# Patient Record
Sex: Female | Born: 1980 | Race: Black or African American | Hispanic: No | Marital: Married | Smoking: Former smoker
Health system: Southern US, Community
[De-identification: ages and names within clinical notes are randomized; demographics above are authoritative.]

## PROBLEM LIST (undated history)

## (undated) DIAGNOSIS — F1910 Other psychoactive substance abuse, uncomplicated: Secondary | ICD-10-CM

## (undated) DIAGNOSIS — D649 Anemia, unspecified: Secondary | ICD-10-CM

## (undated) DIAGNOSIS — F329 Major depressive disorder, single episode, unspecified: Secondary | ICD-10-CM

## (undated) DIAGNOSIS — R000 Tachycardia, unspecified: Secondary | ICD-10-CM

## (undated) DIAGNOSIS — F32A Depression, unspecified: Secondary | ICD-10-CM

## (undated) DIAGNOSIS — J45909 Unspecified asthma, uncomplicated: Secondary | ICD-10-CM

## (undated) DIAGNOSIS — I10 Essential (primary) hypertension: Secondary | ICD-10-CM

## (undated) DIAGNOSIS — J449 Chronic obstructive pulmonary disease, unspecified: Secondary | ICD-10-CM

## (undated) DIAGNOSIS — D72829 Elevated white blood cell count, unspecified: Secondary | ICD-10-CM

## (undated) HISTORY — DX: Depression, unspecified: F32.A

## (undated) HISTORY — DX: Tachycardia, unspecified: R00.0

## (undated) HISTORY — DX: Essential (primary) hypertension: I10

## (undated) HISTORY — DX: Unspecified asthma, uncomplicated: J45.909

## (undated) HISTORY — DX: Other disorders of phosphorus metabolism: E83.39

## (undated) HISTORY — DX: Major depressive disorder, single episode, unspecified: F32.9

## (undated) HISTORY — DX: Anemia, unspecified: D64.9

## (undated) HISTORY — DX: Elevated white blood cell count, unspecified: D72.829

## (undated) HISTORY — DX: Chronic obstructive pulmonary disease, unspecified: J44.9

## (undated) HISTORY — DX: Other psychoactive substance abuse, uncomplicated: F19.10

---

## 2006-04-24 ENCOUNTER — Emergency Department: Payer: Self-pay | Admitting: Emergency Medicine

## 2006-07-01 ENCOUNTER — Emergency Department: Payer: Self-pay | Admitting: Emergency Medicine

## 2010-03-26 ENCOUNTER — Emergency Department: Payer: Self-pay | Admitting: Emergency Medicine

## 2010-03-27 ENCOUNTER — Emergency Department: Payer: Self-pay | Admitting: Emergency Medicine

## 2010-03-30 ENCOUNTER — Emergency Department: Payer: Self-pay | Admitting: Emergency Medicine

## 2010-05-02 HISTORY — PX: DILATION AND CURETTAGE OF UTERUS: SHX78

## 2010-06-17 ENCOUNTER — Emergency Department: Payer: Self-pay | Admitting: Emergency Medicine

## 2010-06-19 ENCOUNTER — Emergency Department: Payer: Self-pay | Admitting: Emergency Medicine

## 2010-07-24 ENCOUNTER — Emergency Department: Payer: Self-pay | Admitting: Emergency Medicine

## 2010-10-01 HISTORY — PX: TRACHEOSTOMY: SUR1362

## 2010-10-18 ENCOUNTER — Inpatient Hospital Stay: Payer: Self-pay | Admitting: Internal Medicine

## 2010-10-26 DIAGNOSIS — I059 Rheumatic mitral valve disease, unspecified: Secondary | ICD-10-CM

## 2010-11-17 DIAGNOSIS — R000 Tachycardia, unspecified: Secondary | ICD-10-CM

## 2010-12-01 ENCOUNTER — Ambulatory Visit: Payer: Self-pay | Admitting: Cardiovascular Disease

## 2010-12-09 ENCOUNTER — Ambulatory Visit (INDEPENDENT_AMBULATORY_CARE_PROVIDER_SITE_OTHER): Payer: Self-pay | Admitting: Cardiovascular Disease

## 2010-12-09 ENCOUNTER — Encounter: Payer: Self-pay | Admitting: Cardiovascular Disease

## 2010-12-09 DIAGNOSIS — R0602 Shortness of breath: Secondary | ICD-10-CM

## 2010-12-09 DIAGNOSIS — R000 Tachycardia, unspecified: Secondary | ICD-10-CM

## 2010-12-09 MED ORDER — ALBUTEROL 90 MCG/ACT IN AERS: 2.0000 | INHALATION_SPRAY | Freq: Four times a day (QID) | RESPIRATORY_TRACT | Status: DC | PRN

## 2010-12-09 NOTE — Assessment & Plan Note (Signed)
Heart rate has improved since her discharge. She has not been taking beta blockers though she does report occasional tachycardia palpitations with exertion. Uncertain why she did not fill the prescription of metoprolol tartrate as it is very inexpensive. We have given her some samples of Bystolic to take as needed for tachycardia palpitations that do not resolve with rest. No further cardiac workup is needed at this time. She can followup with her primary care physician.

## 2010-12-09 NOTE — Patient Instructions (Signed)
Your heart rate is doing well. Please try Bystolic as needed for a fast heart rate that does not slow down. We have written a script for albuterol. Take as needed.  Please call us if you have new issues that need to be addressed before your next appt.  You will need follow up with a lung Doctor:   Dr. Meredeth Ide [phone]

## 2010-12-09 NOTE — Assessment & Plan Note (Signed)
Lungs are essentially clear on auscultation. My suspicion is she continues to have residual discomfort at the site of her tracheostomy. This is what is most bothersome to her. Have asked her to followup with ear nose throat in several weeks' time if she continues to have problems.

## 2010-12-09 NOTE — Progress Notes (Signed)
   Patient ID: Jacqueline Cooke, female    DOB: 1980/07/12, 30 y.o.   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 Cardiology was consult for her tachycardia which was worse with exertion. Her tachycardia was felt to be secondary to underlying lung disease. She was started on low-dose beta blocker. Echocardiogram was essentially normal.  She presents today and reports that she continues to have some shortness of breath and mild tachycardia. She has not been able to fill any of her medications as she could not afford them despite the metoprolol costing $4. She was unable to afford Lexapro which was over $100 and unable to afford albuterol nebulizer. She has been active, no is limited by her shortness of breath and a strange feeling in her throat where her tracheostomy is healing. She has had followup with ENT and nothing was done at that time. She did not have followup with pulmonary at the time of discharge. She is unable to lean back to sleep secondary to a uncomfortable feeling at the tracheostomy site.  EKG shows normal sinus rhythm with rate 81 beats per minute with nonspecific ST changes in leads V3 through V6, 2, 3, aVF  consistent with repolarization abnormality   Outpatient Encounter Prescriptions as of 12/09/2010  Medication Sig Dispense Refill  . No Medications        Review of Systems  Constitutional: Negative.   HENT: Positive for neck pain.   Eyes: Negative.   Respiratory: Positive for shortness of breath.   Cardiovascular: Negative.   Gastrointestinal: Negative.   Skin: Negative.   Neurological: Negative.   Hematological: Negative.   Psychiatric/Behavioral: Negative.   All other  systems reviewed and are negative.    BP 117/77  Pulse 85  Ht 5\' 8"  (1.727 m)  Wt 125 lb (56.7 kg)  BMI 19.01 kg/m2   Physical Exam  Nursing note and vitals reviewed. Constitutional: She is oriented to person, place, and time. She appears well-developed and well-nourished.  HENT:  Head: Normocephalic.  Nose: Nose normal.  Mouth/Throat: Oropharynx is clear and moist.  Eyes: Conjunctivae are normal. Pupils are equal, round, and reactive to light.  Neck: Normal range of motion. Neck supple. No JVD present.       Appears well-healed tracheostomy site  Cardiovascular: Normal rate, regular rhythm, S1 normal, S2 normal, normal heart sounds and intact distal pulses.  Exam reveals no gallop and no friction rub.   No murmur heard. Pulmonary/Chest: Effort normal and breath sounds normal. No respiratory distress. She has no wheezes. She has no rales. She exhibits no tenderness.  Abdominal: Soft. Bowel sounds are normal. She exhibits no distension. There is no tenderness.  Musculoskeletal: Normal range of motion. She exhibits no edema and no tenderness.  Lymphadenopathy:    She has no cervical adenopathy.  Neurological: She is alert and oriented to person, place, and time. Coordination normal.  Skin: Skin is warm and dry. No rash noted. No erythema.  Psychiatric: She has a normal mood and affect. Her behavior is normal. Judgment and thought content normal.         Assessment and Plan

## 2010-12-20 ENCOUNTER — Ambulatory Visit: Payer: Self-pay | Admitting: Otolaryngology

## 2011-01-24 ENCOUNTER — Emergency Department: Payer: Self-pay | Admitting: *Deleted

## 2011-01-28 ENCOUNTER — Inpatient Hospital Stay: Payer: Self-pay | Admitting: Internal Medicine

## 2011-01-29 DIAGNOSIS — R0602 Shortness of breath: Secondary | ICD-10-CM

## 2011-02-03 ENCOUNTER — Emergency Department: Payer: Self-pay | Admitting: *Deleted

## 2011-02-28 ENCOUNTER — Emergency Department: Payer: Self-pay | Admitting: Emergency Medicine

## 2011-03-18 ENCOUNTER — Ambulatory Visit: Payer: Self-pay | Admitting: Obstetrics and Gynecology

## 2011-06-04 ENCOUNTER — Emergency Department: Payer: Self-pay | Admitting: Emergency Medicine

## 2011-06-04 LAB — URINALYSIS, COMPLETE
Bacteria: NONE SEEN
Bilirubin,UR: NEGATIVE
Glucose,UR: NEGATIVE mg/dL (ref 0–75)
Leukocyte Esterase: NEGATIVE
Nitrite: NEGATIVE
Specific Gravity: 1.02 (ref 1.003–1.030)
Squamous Epithelial: 2
WBC UR: 4 /HPF (ref 0–5)

## 2011-06-04 LAB — CBC
HCT: 39 % (ref 35.0–47.0)
HGB: 13.1 g/dL (ref 12.0–16.0)
RBC: 4.36 10*6/uL (ref 3.80–5.20)
RDW: 15 % — ABNORMAL HIGH (ref 11.5–14.5)

## 2011-06-04 LAB — COMPREHENSIVE METABOLIC PANEL
Alkaline Phosphatase: 55 U/L (ref 50–136)
Anion Gap: 10 (ref 7–16)
Bilirubin,Total: 0.2 mg/dL (ref 0.2–1.0)
Calcium, Total: 9.8 mg/dL (ref 8.5–10.1)
Chloride: 103 mmol/L (ref 98–107)
Co2: 26 mmol/L (ref 21–32)
Creatinine: 0.63 mg/dL (ref 0.60–1.30)
EGFR (African American): >60
EGFR (Non-African Amer.): >60
SGPT (ALT): 15 U/L

## 2011-06-04 LAB — LIPASE, BLOOD: Lipase: 61 U/L — ABNORMAL LOW (ref 73–393)

## 2011-06-05 LAB — URINE CULTURE

## 2011-06-28 ENCOUNTER — Emergency Department: Payer: Self-pay | Admitting: Emergency Medicine

## 2011-06-28 LAB — URINALYSIS, COMPLETE
Bilirubin,UR: NEGATIVE
Blood: NEGATIVE
Leukocyte Esterase: NEGATIVE
Nitrite: NEGATIVE
Ph: 5 (ref 4.5–8.0)
Protein: NEGATIVE
Specific Gravity: 1.011 (ref 1.003–1.030)

## 2011-06-28 LAB — PREGNANCY, URINE: Pregnancy Test, Urine: POSITIVE m[IU]/mL

## 2011-09-01 ENCOUNTER — Emergency Department: Payer: Self-pay | Admitting: Emergency Medicine

## 2011-10-30 ENCOUNTER — Emergency Department: Payer: Self-pay | Admitting: Emergency Medicine

## 2011-10-30 LAB — URINALYSIS, COMPLETE
Bacteria: NONE SEEN
Bilirubin,UR: NEGATIVE
Glucose,UR: NEGATIVE mg/dL (ref 0–75)
Ketone: NEGATIVE
Nitrite: NEGATIVE
Protein: 30
RBC,UR: 1 /HPF (ref 0–5)
Specific Gravity: 1.027 (ref 1.003–1.030)
Squamous Epithelial: 6
WBC UR: 1 /HPF (ref 0–5)

## 2011-10-30 LAB — CBC
MCH: 31.4 pg (ref 26.0–34.0)
MCV: 93 fL (ref 80–100)
Platelet: 219 10*3/uL (ref 150–440)
WBC: 10.9 10*3/uL (ref 3.6–11.0)

## 2011-10-30 LAB — COMPREHENSIVE METABOLIC PANEL
Albumin: 3.8 g/dL (ref 3.4–5.0)
Alkaline Phosphatase: 65 U/L (ref 50–136)
BUN: 10 mg/dL (ref 7–18)
Bilirubin,Total: 0.3 mg/dL (ref 0.2–1.0)
EGFR (African American): >60
Osmolality: 278 (ref 275–301)
Potassium: 3.5 mmol/L (ref 3.5–5.1)
SGPT (ALT): 12 U/L

## 2011-10-30 LAB — DRUG SCREEN, URINE
Amphetamines, Ur Screen: NEGATIVE (ref ?–1000)
Benzodiazepine, Ur Scrn: NEGATIVE (ref ?–200)
Cocaine Metabolite,Ur ~~LOC~~: POSITIVE (ref ?–300)
MDMA (Ecstasy)Ur Screen: NEGATIVE (ref ?–500)
Methadone, Ur Screen: NEGATIVE (ref ?–300)
Opiate, Ur Screen: NEGATIVE (ref ?–300)
Phencyclidine (PCP) Ur S: NEGATIVE (ref ?–25)
Tricyclic, Ur Screen: NEGATIVE (ref ?–1000)

## 2011-11-14 ENCOUNTER — Encounter: Payer: Self-pay | Admitting: Maternal and Fetal Medicine

## 2011-11-26 ENCOUNTER — Emergency Department: Payer: Self-pay | Admitting: Emergency Medicine

## 2011-11-26 LAB — COMPREHENSIVE METABOLIC PANEL
Anion Gap: 7 (ref 7–16)
Chloride: 107 mmol/L (ref 98–107)
Co2: 25 mmol/L (ref 21–32)
Creatinine: 0.56 mg/dL — ABNORMAL LOW (ref 0.60–1.30)
EGFR (African American): >60
Osmolality: 276 (ref 275–301)
Potassium: 4 mmol/L (ref 3.5–5.1)
Sodium: 139 mmol/L (ref 136–145)

## 2011-11-26 LAB — CBC
HGB: 13.3 g/dL (ref 12.0–16.0)
RBC: 4.02 10*6/uL (ref 3.80–5.20)
WBC: 10.4 10*3/uL (ref 3.6–11.0)

## 2011-11-26 LAB — URINALYSIS, COMPLETE
Bacteria: NONE SEEN
Bilirubin,UR: NEGATIVE
Glucose,UR: NEGATIVE mg/dL (ref 0–75)
Ketone: NEGATIVE
Ph: 7 (ref 4.5–8.0)

## 2011-11-26 LAB — HCG, QUANTITATIVE, PREGNANCY: Beta Hcg, Quant.: 54328 m[IU]/mL — ABNORMAL HIGH

## 2011-11-29 ENCOUNTER — Emergency Department: Payer: Self-pay | Admitting: Emergency Medicine

## 2011-11-29 LAB — URINALYSIS, COMPLETE
Bacteria: NONE SEEN
Bilirubin,UR: NEGATIVE
Glucose,UR: NEGATIVE mg/dL (ref 0–75)
Leukocyte Esterase: NEGATIVE
Nitrite: NEGATIVE
Ph: 6 (ref 4.5–8.0)
RBC,UR: 1777 /HPF (ref 0–5)
Squamous Epithelial: NONE SEEN

## 2011-11-29 LAB — CBC
MCH: 33.1 pg (ref 26.0–34.0)
MCHC: 35.3 g/dL (ref 32.0–36.0)
MCV: 94 fL (ref 80–100)
Platelet: 228 10*3/uL (ref 150–440)
RBC: 4.18 10*6/uL (ref 3.80–5.20)
RDW: 14.9 % — ABNORMAL HIGH (ref 11.5–14.5)
WBC: 10.3 10*3/uL (ref 3.6–11.0)

## 2011-11-29 LAB — WET PREP, GENITAL

## 2012-01-03 ENCOUNTER — Ambulatory Visit: Payer: Self-pay | Admitting: Obstetrics and Gynecology

## 2012-01-03 LAB — CBC
HGB: 13.2 g/dL (ref 12.0–16.0)
MCH: 32.4 pg (ref 26.0–34.0)
MCHC: 34.1 g/dL (ref 32.0–36.0)
MCV: 95 fL (ref 80–100)
RBC: 4.08 10*6/uL (ref 3.80–5.20)
RDW: 13.9 % (ref 11.5–14.5)
WBC: 11.8 10*3/uL — ABNORMAL HIGH (ref 3.6–11.0)

## 2012-01-03 LAB — RAPID URINE DRUG SCREEN, HOSP PERFORMED
Amphetamines, Ur Screen: NEGATIVE (ref ?–1000)
Barbiturates, Ur Screen: NEGATIVE (ref ?–200)
Benzodiazepine, Ur Scrn: NEGATIVE (ref ?–200)
Cocaine Metabolite,Ur ~~LOC~~: POSITIVE (ref ?–300)

## 2012-01-05 ENCOUNTER — Ambulatory Visit: Payer: Self-pay | Admitting: Obstetrics and Gynecology

## 2012-01-05 LAB — RAPID URINE DRUG SCREEN, HOSP PERFORMED
Amphetamines, Ur Screen: NEGATIVE (ref ?–1000)
Opiate, Ur Screen: NEGATIVE (ref ?–300)

## 2012-01-06 ENCOUNTER — Inpatient Hospital Stay: Payer: Self-pay | Admitting: Psychiatry

## 2012-01-06 LAB — CBC WITH DIFFERENTIAL/PLATELET
Basophil %: 0.2 %
Eosinophil %: 1.3 %
HGB: 12 g/dL (ref 12.0–16.0)
Lymphocyte #: 1.6 10*3/uL (ref 1.0–3.6)
Lymphocyte %: 16.6 %
MCH: 33.7 pg (ref 26.0–34.0)
MCV: 97 fL (ref 80–100)
Monocyte #: 0.6 x10 3/mm (ref 0.2–0.9)
Monocyte %: 6.2 %
Neutrophil %: 75.7 %
Platelet: 220 10*3/uL (ref 150–440)
RBC: 3.55 10*6/uL — ABNORMAL LOW (ref 3.80–5.20)
RDW: 14.1 % (ref 11.5–14.5)
WBC: 9.4 10*3/uL (ref 3.6–11.0)

## 2012-01-06 LAB — BASIC METABOLIC PANEL
Anion Gap: 5 — ABNORMAL LOW (ref 7–16)
BUN: 4 mg/dL — ABNORMAL LOW (ref 7–18)
Calcium, Total: 8.8 mg/dL (ref 8.5–10.1)
Chloride: 107 mmol/L (ref 98–107)
Co2: 26 mmol/L (ref 21–32)
Creatinine: 0.5 mg/dL — ABNORMAL LOW (ref 0.60–1.30)
EGFR (Non-African Amer.): >60
Glucose: 80 mg/dL (ref 65–99)
Osmolality: 272 (ref 275–301)

## 2012-01-06 LAB — DRUG SCREEN, URINE
Barbiturates, Ur Screen: NEGATIVE (ref ?–200)
Cannabinoid 50 Ng, Ur ~~LOC~~: NEGATIVE (ref ?–50)
Cocaine Metabolite,Ur ~~LOC~~: POSITIVE (ref ?–300)
Methadone, Ur Screen: NEGATIVE (ref ?–300)
Opiate, Ur Screen: NEGATIVE (ref ?–300)
Tricyclic, Ur Screen: NEGATIVE (ref ?–1000)

## 2012-01-06 LAB — TSH: Thyroid Stimulating Horm: 0.651 u[IU]/mL

## 2012-01-06 LAB — FOLATE: Folic Acid: 17.3 ng/mL (ref 3.1–100.0)

## 2012-01-07 LAB — DRUG SCREEN, URINE
Barbiturates, Ur Screen: NEGATIVE (ref ?–200)
Benzodiazepine, Ur Scrn: NEGATIVE (ref ?–200)
Cannabinoid 50 Ng, Ur ~~LOC~~: NEGATIVE (ref ?–50)
Cocaine Metabolite,Ur ~~LOC~~: POSITIVE (ref ?–300)
MDMA (Ecstasy)Ur Screen: NEGATIVE (ref ?–500)
Methadone, Ur Screen: NEGATIVE (ref ?–300)
Phencyclidine (PCP) Ur S: NEGATIVE (ref ?–25)

## 2012-01-08 LAB — CBC WITH DIFFERENTIAL/PLATELET
Basophil #: 0 10*3/uL (ref 0.0–0.1)
Basophil %: 0.4 %
Eosinophil %: 2.8 %
HGB: 11.2 g/dL — ABNORMAL LOW (ref 12.0–16.0)
Lymphocyte #: 2 10*3/uL (ref 1.0–3.6)
MCH: 33 pg (ref 26.0–34.0)
MCV: 97 fL (ref 80–100)
Monocyte #: 0.7 x10 3/mm (ref 0.2–0.9)
Monocyte %: 9.1 %
Platelet: 195 10*3/uL (ref 150–440)
RBC: 3.39 10*6/uL — ABNORMAL LOW (ref 3.80–5.20)
RDW: 14.1 % (ref 11.5–14.5)
WBC: 7.3 10*3/uL (ref 3.6–11.0)

## 2012-01-08 LAB — DRUG SCREEN, URINE
Benzodiazepine, Ur Scrn: NEGATIVE (ref ?–200)
Cannabinoid 50 Ng, Ur ~~LOC~~: NEGATIVE (ref ?–50)
Methadone, Ur Screen: NEGATIVE (ref ?–300)
Phencyclidine (PCP) Ur S: NEGATIVE (ref ?–25)
Tricyclic, Ur Screen: NEGATIVE (ref ?–1000)

## 2012-01-09 LAB — CBC WITH DIFFERENTIAL/PLATELET
Basophil #: 0 10*3/uL (ref 0.0–0.1)
Basophil %: 0.3 %
Comment - H1-Com1: NORMAL
Comment - H1-Com2: NORMAL
Eosinophil %: 2 %
HCT: 35.2 % (ref 35.0–47.0)
HGB: 11.9 g/dL — ABNORMAL LOW (ref 12.0–16.0)
Lymphocyte #: 1.7 10*3/uL (ref 1.0–3.6)
Lymphocyte %: 25.7 %
Lymphocytes: 33 %
Monocyte #: 0.6 x10 3/mm (ref 0.2–0.9)
Monocyte %: 8.2 %
Monocytes: 5 %
Neutrophil #: 4.3 10*3/uL (ref 1.4–6.5)
Neutrophil %: 63.8 %
Segmented Neutrophils: 61 %
WBC: 6.8 10*3/uL (ref 3.6–11.0)

## 2012-01-10 ENCOUNTER — Ambulatory Visit: Payer: Self-pay | Admitting: Obstetrics and Gynecology

## 2012-01-10 LAB — CBC WITH DIFFERENTIAL/PLATELET
Basophil %: 0.8 %
Eosinophil #: 0.2 10*3/uL (ref 0.0–0.7)
Eosinophil %: 3.1 %
HGB: 11.5 g/dL — ABNORMAL LOW (ref 12.0–16.0)
Lymphocyte #: 1.7 10*3/uL (ref 1.0–3.6)
Lymphocyte %: 26.1 %
MCH: 33.3 pg (ref 26.0–34.0)
MCHC: 34.2 g/dL (ref 32.0–36.0)
MCV: 97 fL (ref 80–100)
Monocyte #: 0.5 x10 3/mm (ref 0.2–0.9)
Neutrophil %: 62.2 %
Platelet: 188 10*3/uL (ref 150–440)
RBC: 3.46 10*6/uL — ABNORMAL LOW (ref 3.80–5.20)
RDW: 14.3 % (ref 11.5–14.5)
WBC: 6.6 10*3/uL (ref 3.6–11.0)

## 2012-01-10 LAB — COMPREHENSIVE METABOLIC PANEL
Albumin: 3.2 g/dL — ABNORMAL LOW (ref 3.4–5.0)
Alkaline Phosphatase: 53 U/L (ref 50–136)
BUN: 4 mg/dL — ABNORMAL LOW (ref 7–18)
Bilirubin,Total: 0.2 mg/dL (ref 0.2–1.0)
Chloride: 105 mmol/L (ref 98–107)
Co2: 23 mmol/L (ref 21–32)
EGFR (Non-African Amer.): >60
Glucose: 81 mg/dL (ref 65–99)
Osmolality: 268 (ref 275–301)
SGOT(AST): 6 U/L — ABNORMAL LOW (ref 15–37)
SGPT (ALT): 11 U/L — ABNORMAL LOW (ref 12–78)

## 2012-01-22 ENCOUNTER — Emergency Department: Payer: Self-pay | Admitting: Emergency Medicine

## 2012-01-22 LAB — COMPREHENSIVE METABOLIC PANEL
Alkaline Phosphatase: 80 U/L (ref 50–136)
Anion Gap: 9 (ref 7–16)
BUN: 4 mg/dL — ABNORMAL LOW (ref 7–18)
Bilirubin,Total: 0.3 mg/dL (ref 0.2–1.0)
Calcium, Total: 8.8 mg/dL (ref 8.5–10.1)
Chloride: 107 mmol/L (ref 98–107)
Co2: 25 mmol/L (ref 21–32)
Creatinine: 0.8 mg/dL (ref 0.60–1.30)
EGFR (African American): >60
EGFR (Non-African Amer.): >60
Glucose: 101 mg/dL — ABNORMAL HIGH (ref 65–99)
Osmolality: 278 (ref 275–301)
Potassium: 3.6 mmol/L (ref 3.5–5.1)

## 2012-01-22 LAB — URINALYSIS, COMPLETE
Bacteria: NONE SEEN
Bilirubin,UR: NEGATIVE
Glucose,UR: NEGATIVE mg/dL (ref 0–75)
Ketone: NEGATIVE
RBC,UR: 152 /HPF (ref 0–5)
Specific Gravity: 1.02 (ref 1.003–1.030)
Squamous Epithelial: NONE SEEN
WBC UR: 2317 /HPF (ref 0–5)

## 2012-01-22 LAB — LIPASE, BLOOD: Lipase: 41 U/L — ABNORMAL LOW (ref 73–393)

## 2012-01-22 LAB — CBC
HCT: 38.1 % (ref 35.0–47.0)
HGB: 12.9 g/dL (ref 12.0–16.0)
MCHC: 34 g/dL (ref 32.0–36.0)
Platelet: 253 10*3/uL (ref 150–440)
RBC: 3.97 10*6/uL (ref 3.80–5.20)

## 2012-01-22 LAB — MAGNESIUM: Magnesium: 2.1 mg/dL

## 2012-01-28 LAB — CULTURE, BLOOD (SINGLE)

## 2012-04-17 ENCOUNTER — Emergency Department: Payer: Self-pay | Admitting: Emergency Medicine

## 2012-04-17 LAB — COMPREHENSIVE METABOLIC PANEL
Alkaline Phosphatase: 82 U/L (ref 50–136)
Bilirubin,Total: 0.3 mg/dL (ref 0.2–1.0)
Calcium, Total: 8.7 mg/dL (ref 8.5–10.1)
Co2: 24 mmol/L (ref 21–32)
EGFR (African American): >60
EGFR (Non-African Amer.): >60
Glucose: 71 mg/dL (ref 65–99)
Potassium: 3.5 mmol/L (ref 3.5–5.1)
SGPT (ALT): 23 U/L (ref 12–78)
Sodium: 137 mmol/L (ref 136–145)
Total Protein: 7.3 g/dL (ref 6.4–8.2)

## 2012-04-17 LAB — SALICYLATE LEVEL: Salicylates, Serum: 2.6 mg/dL

## 2012-04-17 LAB — PREGNANCY, URINE: Pregnancy Test, Urine: NEGATIVE m[IU]/mL

## 2012-04-17 LAB — DRUG SCREEN, URINE
Benzodiazepine, Ur Scrn: NEGATIVE (ref ?–200)
Cannabinoid 50 Ng, Ur ~~LOC~~: NEGATIVE (ref ?–50)
Cocaine Metabolite,Ur ~~LOC~~: POSITIVE (ref ?–300)
MDMA (Ecstasy)Ur Screen: NEGATIVE (ref ?–500)
Methadone, Ur Screen: NEGATIVE (ref ?–300)
Phencyclidine (PCP) Ur S: NEGATIVE (ref ?–25)
Tricyclic, Ur Screen: NEGATIVE (ref ?–1000)

## 2012-04-17 LAB — ACETAMINOPHEN LEVEL: Acetaminophen: <2 ug/mL

## 2012-04-17 LAB — CBC
HCT: 38.7 % (ref 35.0–47.0)
HGB: 12.9 g/dL (ref 12.0–16.0)
MCH: 30.1 pg (ref 26.0–34.0)
MCV: 90 fL (ref 80–100)
RBC: 4.29 10*6/uL (ref 3.80–5.20)
RDW: 16 % — ABNORMAL HIGH (ref 11.5–14.5)
WBC: 7.7 10*3/uL (ref 3.6–11.0)

## 2012-04-17 LAB — URINALYSIS, COMPLETE
Blood: NEGATIVE
Ketone: NEGATIVE
Leukocyte Esterase: NEGATIVE
Nitrite: NEGATIVE
Ph: 6 (ref 4.5–8.0)
Protein: NEGATIVE
Specific Gravity: 1.013 (ref 1.003–1.030)
Squamous Epithelial: 8

## 2012-04-17 LAB — RAPID INFLUENZA A&B ANTIGENS

## 2012-05-03 LAB — DRUG SCREEN, URINE
Barbiturates, Ur Screen: NEGATIVE (ref ?–200)
Benzodiazepine, Ur Scrn: NEGATIVE (ref ?–200)
Cannabinoid 50 Ng, Ur ~~LOC~~: NEGATIVE (ref ?–50)
Methadone, Ur Screen: NEGATIVE (ref ?–300)
Tricyclic, Ur Screen: NEGATIVE (ref ?–1000)

## 2012-05-03 LAB — COMPREHENSIVE METABOLIC PANEL
Albumin: 4.1 g/dL (ref 3.4–5.0)
Alkaline Phosphatase: 89 U/L (ref 50–136)
Anion Gap: 6 — ABNORMAL LOW (ref 7–16)
BUN: 13 mg/dL (ref 7–18)
Bilirubin,Total: 0.3 mg/dL (ref 0.2–1.0)
Chloride: 106 mmol/L (ref 98–107)
Co2: 24 mmol/L (ref 21–32)
Potassium: 4 mmol/L (ref 3.5–5.1)
SGOT(AST): 16 U/L (ref 15–37)
SGPT (ALT): 13 U/L (ref 12–78)
Sodium: 136 mmol/L (ref 136–145)
Total Protein: 8.2 g/dL (ref 6.4–8.2)

## 2012-05-03 LAB — ETHANOL
Ethanol %: <0.003 % (ref 0.000–0.080)
Ethanol: <3 mg/dL

## 2012-05-03 LAB — URINALYSIS, COMPLETE
Bilirubin,UR: NEGATIVE
Glucose,UR: NEGATIVE mg/dL (ref 0–75)
Ph: 6 (ref 4.5–8.0)
RBC,UR: 2 /HPF (ref 0–5)
Specific Gravity: 1.025 (ref 1.003–1.030)

## 2012-05-03 LAB — CBC
HCT: 39.6 % (ref 35.0–47.0)
HGB: 12.7 g/dL (ref 12.0–16.0)
MCHC: 32 g/dL (ref 32.0–36.0)
MCV: 90 fL (ref 80–100)
Platelet: 259 10*3/uL (ref 150–440)

## 2012-05-03 LAB — HCG, QUANTITATIVE, PREGNANCY: Beta Hcg, Quant.: 1333 m[IU]/mL — ABNORMAL HIGH

## 2012-05-04 ENCOUNTER — Inpatient Hospital Stay: Payer: Self-pay | Admitting: Psychiatry

## 2012-05-07 LAB — URINALYSIS, COMPLETE
Bacteria: NONE SEEN
Blood: NEGATIVE
Leukocyte Esterase: NEGATIVE
Protein: NEGATIVE
RBC,UR: 1 /HPF (ref 0–5)
Specific Gravity: 1.031 (ref 1.003–1.030)
WBC UR: 1 /HPF (ref 0–5)

## 2012-06-01 IMAGING — CR DG CHEST 1V PORT
1 series · 1 of 1 positions shown · non-contrast
Comparison: none

REASON FOR EXAM: pt on vent
COMMENTS:

[view not recorded]
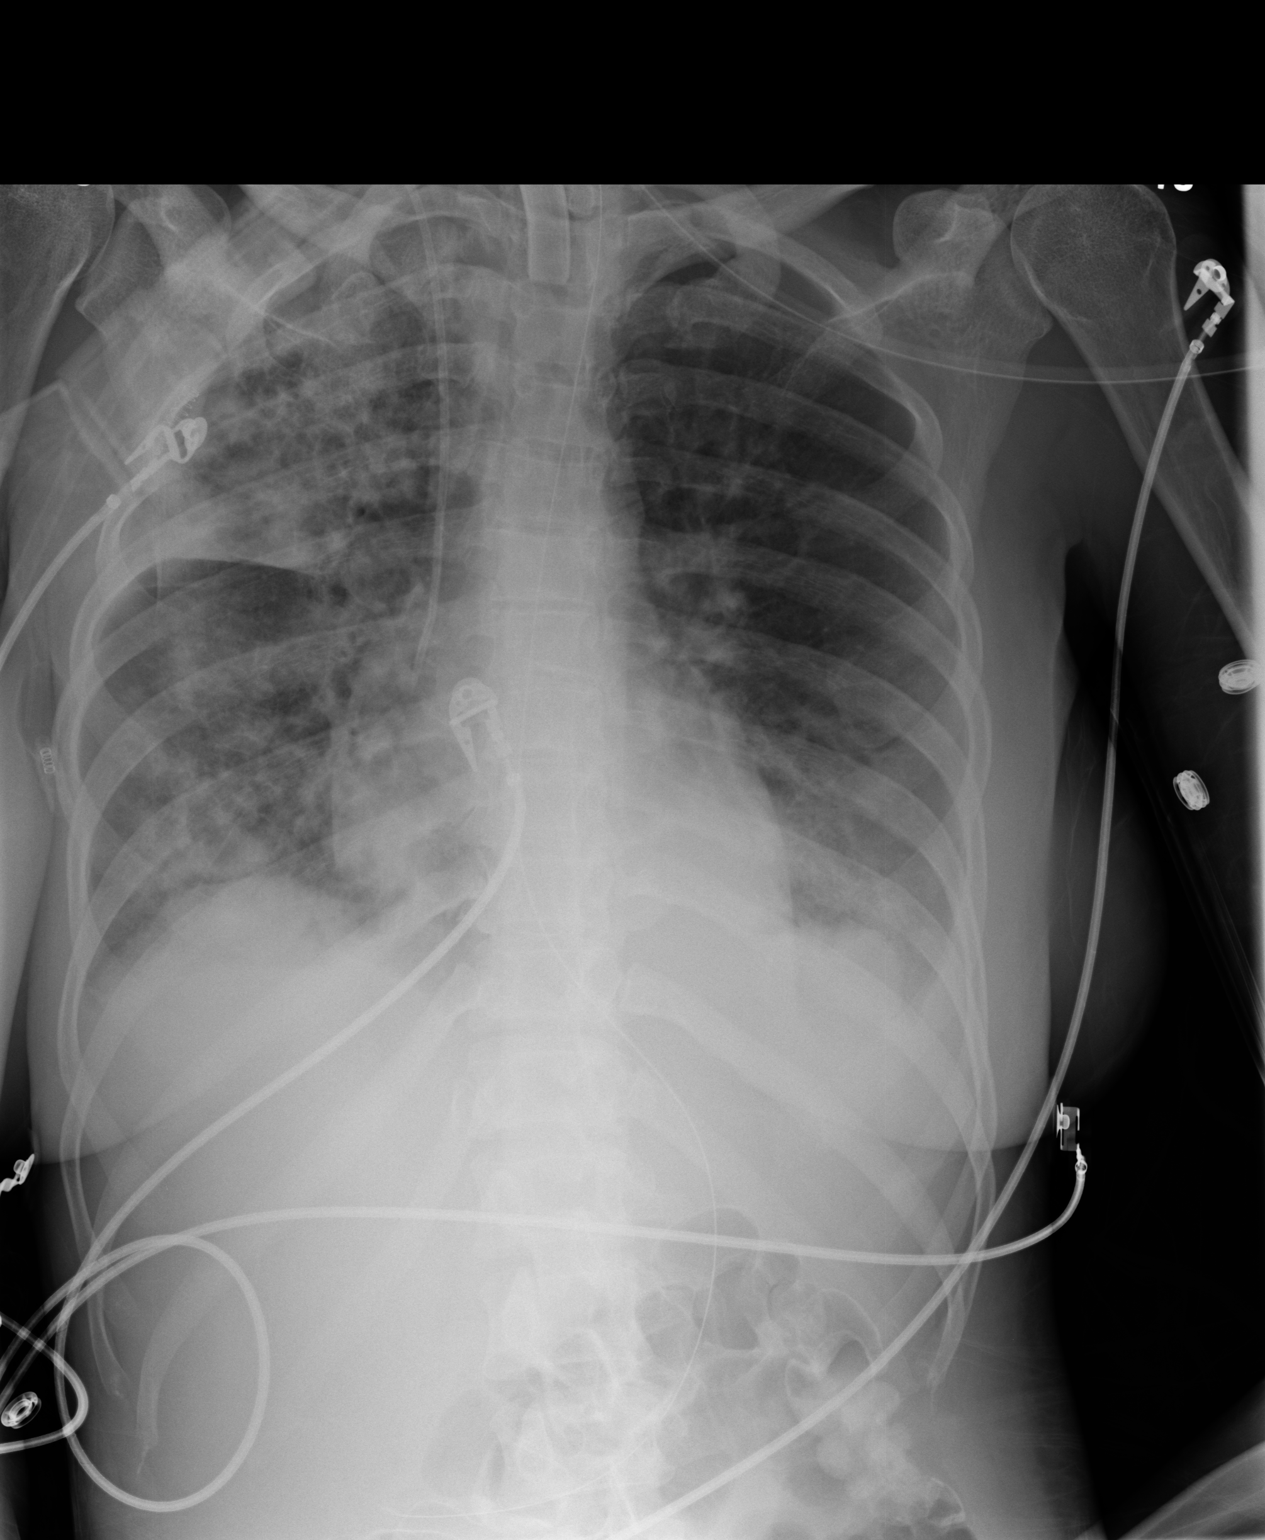

[1 of 1 positions shown; findings below may reference images not displayed]

PROCEDURE:     DXR - DXR PORTABLE CHEST SINGLE VIEW  - November 02, 2010  [DATE]

RESULT:     Comparison is made to a prior study dated 10/30/2010.

An endotracheal tube is appreciated with the tip at the level of the
clavicles. An NG tube is seen with tip not on the view of this study.

The diffuse pulmonary opacities within the right hemithorax have decreased
in conspicuity when compared to previous study. There is residual density
throughout right hemithorax with greatest confluence in the right upper
lobe. There has been a decrease in left lower lobe density. Prominence of
the interstitial markings is once again appreciated. The cardiac silhouette
is within normal limits. The visualized bony skeleton is unremarkable.
IMPRESSION: Decreased density of the bilateral infiltrates.  Continued surveillance
evaluation recommended.

## 2012-06-03 IMAGING — NM NUCLEAR MEDICINE HEPATOHBILIARY INCLUDE GB
1 series · 7 of 7 positions shown · non-contrast
Comparison: none

REASON FOR EXAM: elevated WBC on vent sludge on US
COMMENTS:

[Series 1000: gallbladder statics · 4.80mm/px · 7 of 7 slices shown]
[im 1/7]
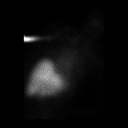
[im 2/7]
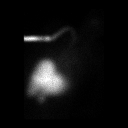
[im 3/7]
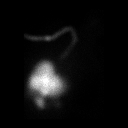
[im 4/7]
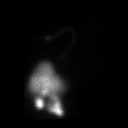
[im 5/7]
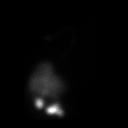
[im 6/7]
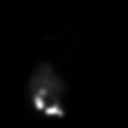
[im 7/7]
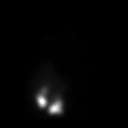

[7 of 7 positions shown; findings below may reference images not displayed]

PROCEDURE:     NM  - NM HEPATOBILIARY IMAGE  - November 04, 2010 [DATE]

RESULT:     The patient received a dose of 7.56 mCi of technetium 99m
Choletec through the central line. The patient is slightly rotated. There is
prompt extraction of tracer from the blood pool by the liver. Gallbladder
activity is first seen at 5 to 10 minutes with small bowel activity present
at 15 minutes. There is progressive clearing of activity from the liver.
IMPRESSION: Findings suggestive of normal Hepatobiliary Scan.

## 2012-06-06 ENCOUNTER — Emergency Department: Payer: Self-pay

## 2012-06-06 LAB — CBC
HCT: 41.2 % (ref 35.0–47.0)
HGB: 13.9 g/dL (ref 12.0–16.0)
MCH: 31.1 pg (ref 26.0–34.0)
MCHC: 33.7 g/dL (ref 32.0–36.0)
MCV: 92 fL (ref 80–100)
Platelet: 214 10*3/uL (ref 150–440)
RDW: 17.2 % — ABNORMAL HIGH (ref 11.5–14.5)
WBC: 14.6 10*3/uL — ABNORMAL HIGH (ref 3.6–11.0)

## 2012-06-06 LAB — COMPREHENSIVE METABOLIC PANEL
Albumin: 4.1 g/dL (ref 3.4–5.0)
Alkaline Phosphatase: 72 U/L (ref 50–136)
BUN: 8 mg/dL (ref 7–18)
Co2: 24 mmol/L (ref 21–32)
Creatinine: 0.55 mg/dL — ABNORMAL LOW (ref 0.60–1.30)
SGOT(AST): 17 U/L (ref 15–37)
SGPT (ALT): 19 U/L (ref 12–78)
Sodium: 134 mmol/L — ABNORMAL LOW (ref 136–145)

## 2012-06-06 LAB — URINALYSIS, COMPLETE
Bilirubin,UR: NEGATIVE
Blood: NEGATIVE
Glucose,UR: NEGATIVE mg/dL (ref 0–75)
Nitrite: NEGATIVE
Ph: 7 (ref 4.5–8.0)
Specific Gravity: 1.02 (ref 1.003–1.030)
WBC UR: 1 /HPF (ref 0–5)

## 2012-06-06 LAB — HCG, QUANTITATIVE, PREGNANCY: Beta Hcg, Quant.: 86210 m[IU]/mL — ABNORMAL HIGH

## 2012-06-17 ENCOUNTER — Emergency Department: Payer: Self-pay | Admitting: Emergency Medicine

## 2012-06-17 LAB — COMPREHENSIVE METABOLIC PANEL
Anion Gap: 8 (ref 7–16)
Calcium, Total: 9.1 mg/dL (ref 8.5–10.1)
Chloride: 104 mmol/L (ref 98–107)
Creatinine: 0.56 mg/dL — ABNORMAL LOW (ref 0.60–1.30)
EGFR (African American): >60
EGFR (Non-African Amer.): >60
Glucose: 99 mg/dL (ref 65–99)
Osmolality: 276 (ref 275–301)
Potassium: 3 mmol/L — ABNORMAL LOW (ref 3.5–5.1)
SGOT(AST): 19 U/L (ref 15–37)
SGPT (ALT): 18 U/L (ref 12–78)
Sodium: 139 mmol/L (ref 136–145)
Total Protein: 7.6 g/dL (ref 6.4–8.2)

## 2012-06-17 LAB — URINALYSIS, COMPLETE
Bilirubin,UR: NEGATIVE
Glucose,UR: NEGATIVE mg/dL (ref 0–75)
Protein: 30
RBC,UR: 13 /HPF (ref 0–5)
Specific Gravity: 1.026 (ref 1.003–1.030)
Squamous Epithelial: 17
WBC UR: 48 /HPF (ref 0–5)

## 2012-06-17 LAB — CBC
HGB: 13.1 g/dL (ref 12.0–16.0)
MCH: 32.1 pg (ref 26.0–34.0)
MCHC: 34.7 g/dL (ref 32.0–36.0)
MCV: 93 fL (ref 80–100)
RDW: 16.2 % — ABNORMAL HIGH (ref 11.5–14.5)
WBC: 9.6 10*3/uL (ref 3.6–11.0)

## 2012-06-17 LAB — RAPID INFLUENZA A&B ANTIGENS

## 2012-06-17 LAB — WET PREP, GENITAL

## 2012-08-24 DIAGNOSIS — F1911 Other psychoactive substance abuse, in remission: Secondary | ICD-10-CM | POA: Insufficient documentation

## 2012-09-27 IMAGING — US US OB < 14 WEEKS - US OB TV
1 series · 17 of 28 positions shown · non-contrast
Comparison: None

REASON FOR EXAM: vaginal bleeding, passing clots, pelvic pain
COMMENTS:   LMP: [DATE]

PROCEDURE:     US  - US OB LESS THAN 14 WEEKS/W TRANS  - February 28, 2011  [DATE]
RESULT:     Indication: Vaginal bleeding. Pelvic pain.
TECHNIQUE: Multiple transabdominal gray-scale images and endovaginal
gray-scale images of the pelvis performed.

[Series 1: us ob < 14 weeks - us ob tv · 17 of 89 slices shown]
[im 1/89]
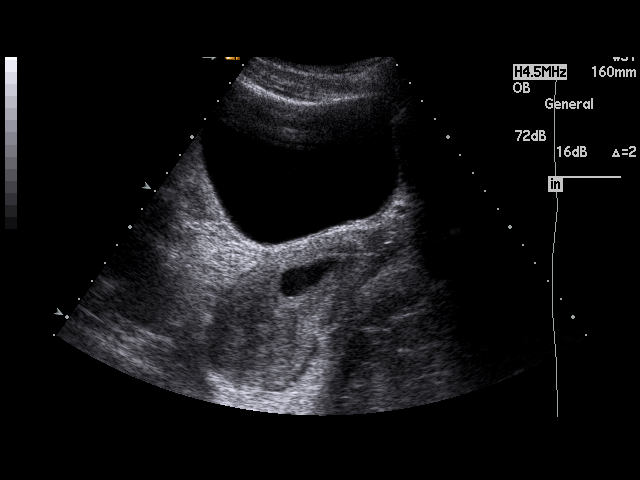
[im 7/89]
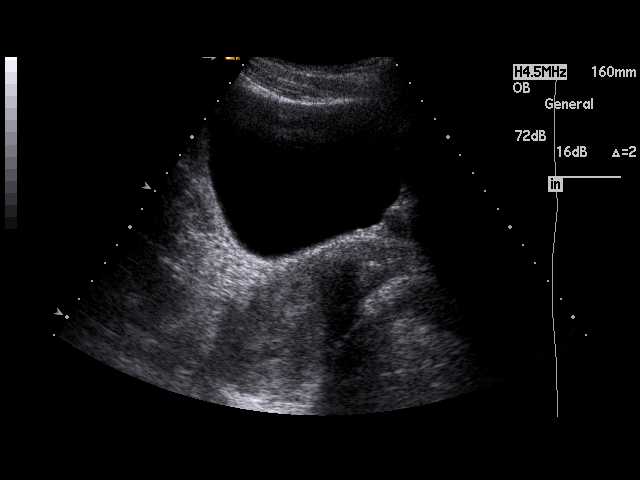
[im 14/89]
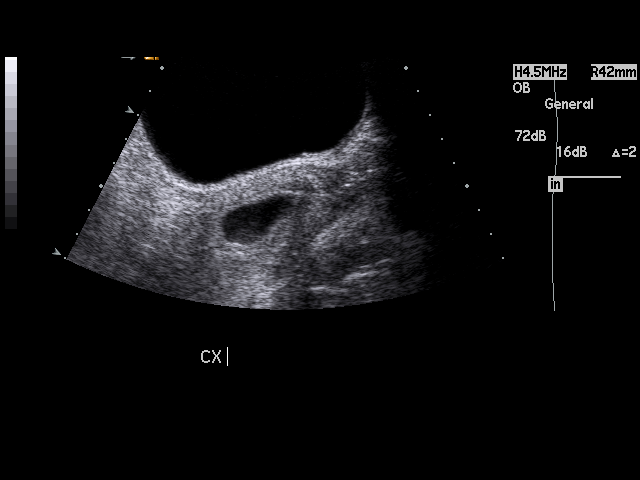
[im 17/89]
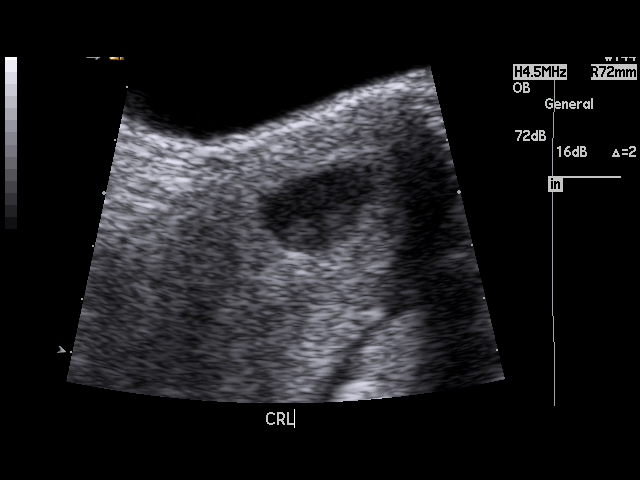
[im 23/89]
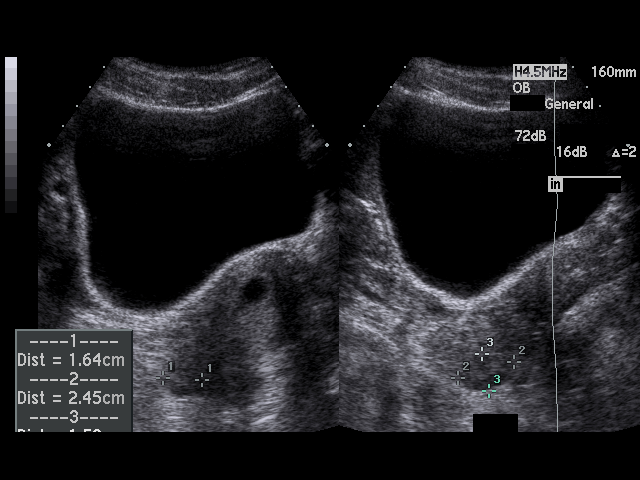
[im 30/89]
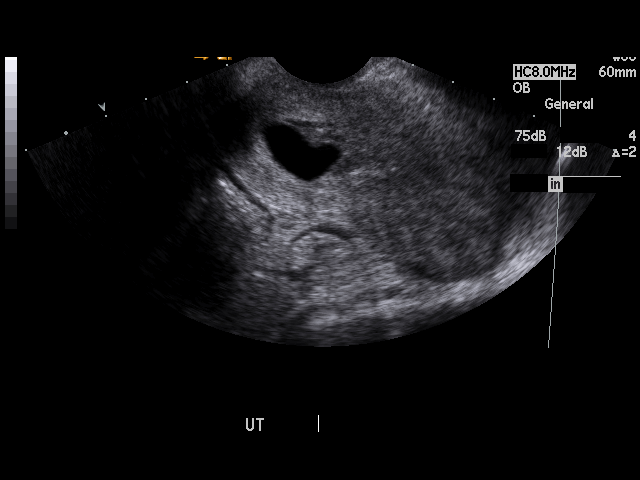
[im 33/89]
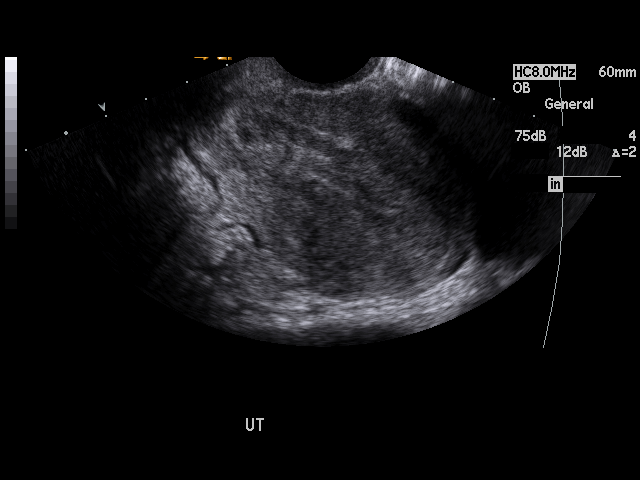
[im 40/89]
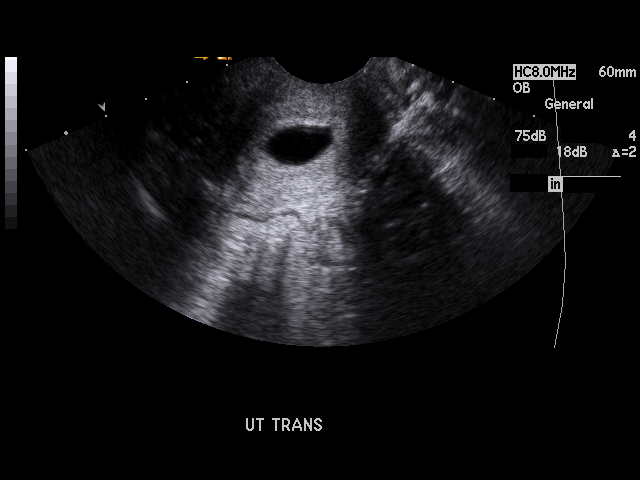
[im 46/89]
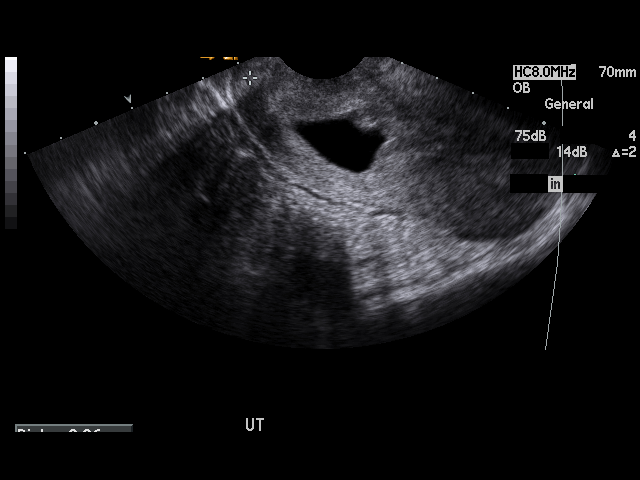
[im 49/89]
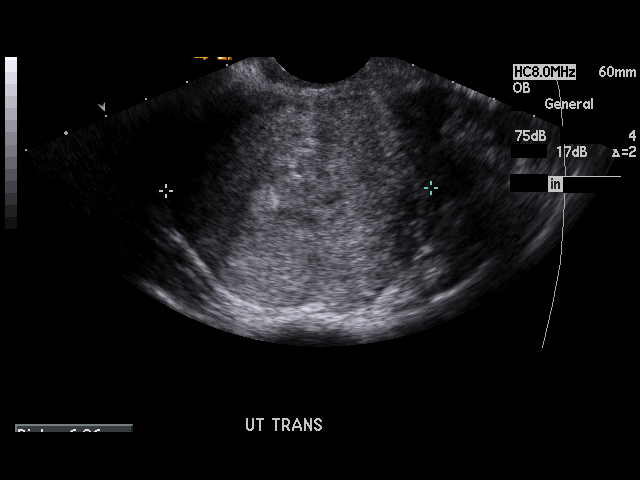
[im 56/89]
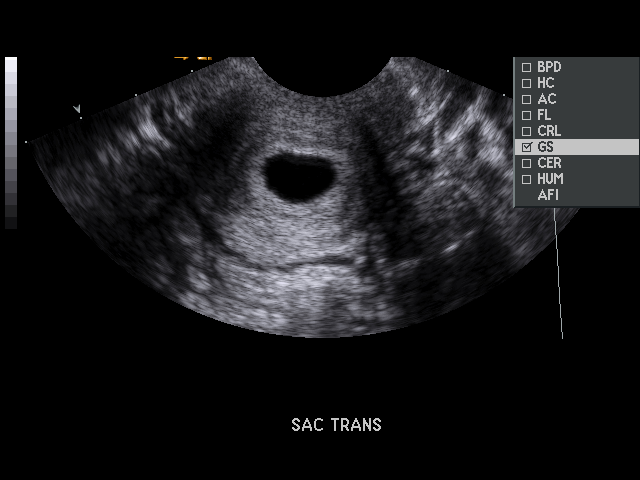
[im 59/89]
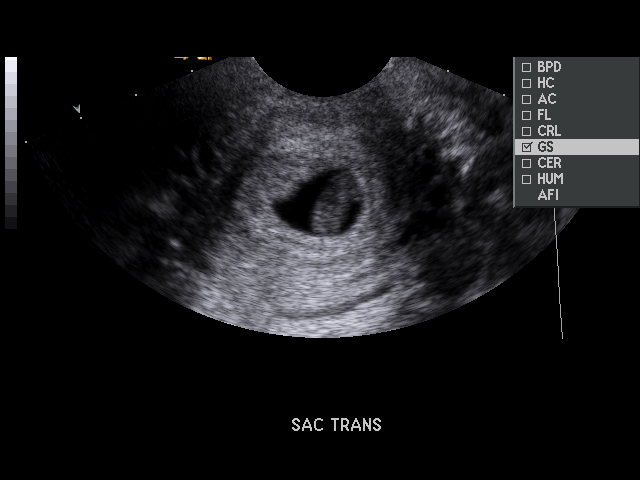
[im 66/89]
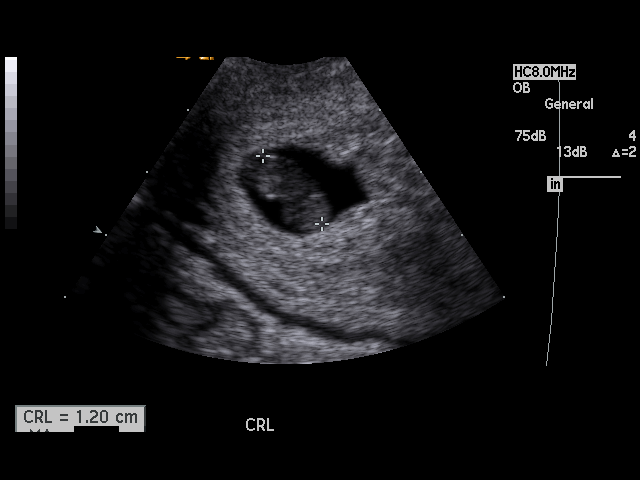
[im 72/89]
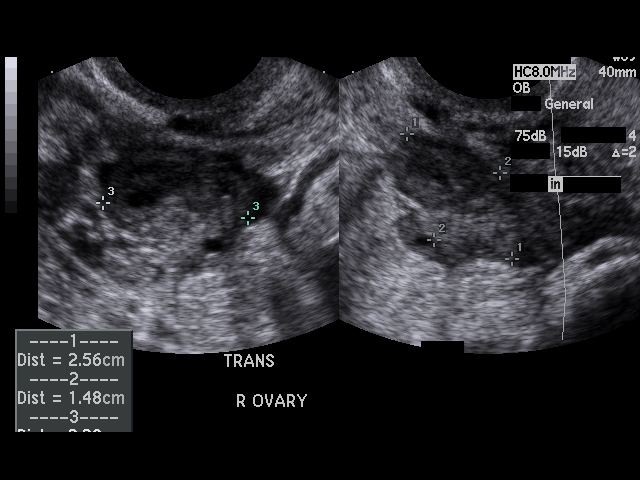
[im 75/89]
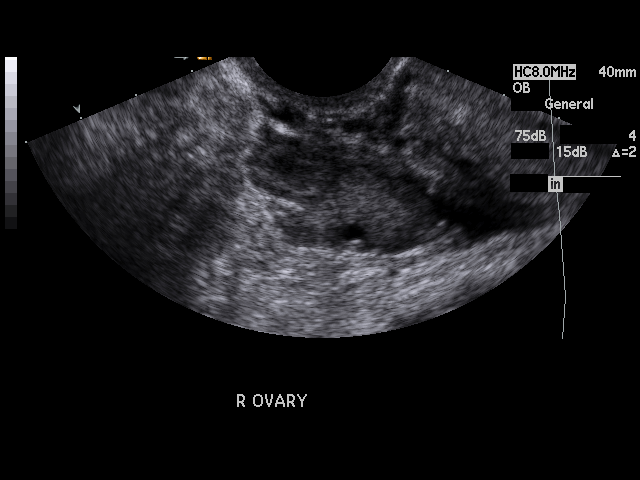
[im 82/89]
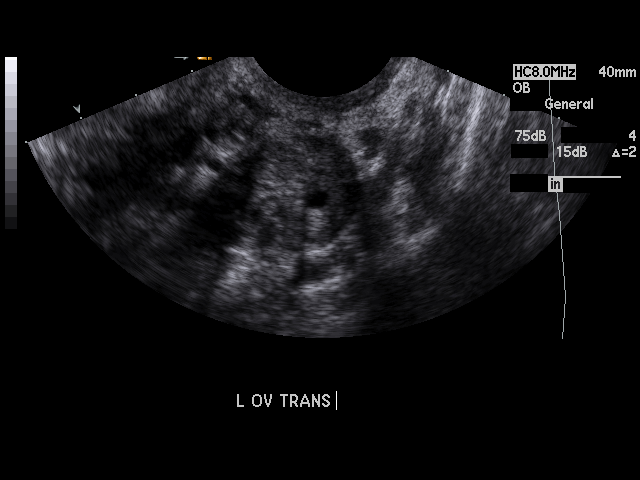
[im 89/89]
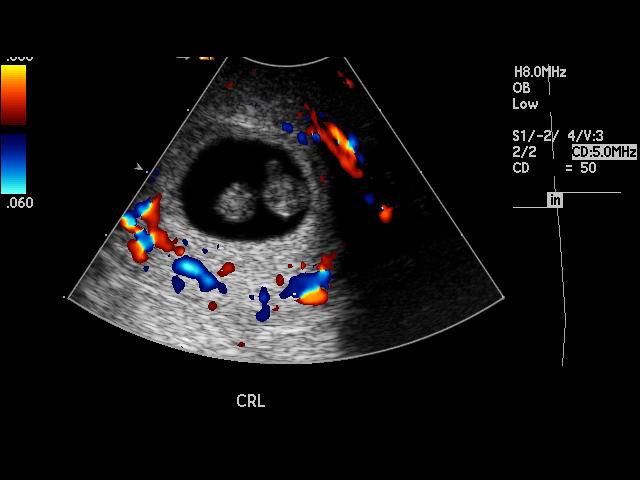

[17 of 28 positions shown; findings below may reference images not displayed]

FINDINGS: There is a single intrauterine pregnancy visualized with a crown-rump length
of 1.23 cm dating the pregnancy at 7 weeks 3 days. There is a normal yolk
sac. No fetal heart rate is detected. The gestational sac is located within
the lower uterine segment.

The right ovary measures 2.6 x 1.5 x 2.3 cm.  The left ovary measures 3.5 x
1.6 x 1.8 cm.  There is no adnexal mass.  There is a 1.7 cm right ovarian
cyst and likely resenting a corpus luteum cyst.

There is no pelvic free fluid.
IMPRESSION: Single intrauterine pregnancy dating 7 weeks 3 days. No fetal heart rate is
detected. The appearance is most concerning for fetal demise. OB/GYN
consultation recommended.

## 2012-10-12 ENCOUNTER — Observation Stay: Payer: Self-pay | Admitting: Obstetrics & Gynecology

## 2012-10-12 LAB — DRUG SCREEN, URINE
Amphetamines, Ur Screen: NEGATIVE (ref ?–1000)
Cannabinoid 50 Ng, Ur ~~LOC~~: NEGATIVE (ref ?–50)
Cocaine Metabolite,Ur ~~LOC~~: POSITIVE (ref ?–300)
Opiate, Ur Screen: NEGATIVE (ref ?–300)
Tricyclic, Ur Screen: NEGATIVE (ref ?–1000)

## 2013-03-05 ENCOUNTER — Emergency Department: Payer: Self-pay | Admitting: Emergency Medicine

## 2013-03-05 LAB — BASIC METABOLIC PANEL
Anion Gap: 5 — ABNORMAL LOW (ref 7–16)
BUN: 12 mg/dL (ref 7–18)
Calcium, Total: 8.7 mg/dL (ref 8.5–10.1)
Co2: 27 mmol/L (ref 21–32)
Creatinine: 0.8 mg/dL (ref 0.60–1.30)
EGFR (African American): >60
Glucose: 120 mg/dL — ABNORMAL HIGH (ref 65–99)
Osmolality: 280 (ref 275–301)
Potassium: 3.1 mmol/L — ABNORMAL LOW (ref 3.5–5.1)

## 2013-03-05 LAB — DRUG SCREEN, URINE
Amphetamines, Ur Screen: NEGATIVE (ref ?–1000)
Cocaine Metabolite,Ur ~~LOC~~: NEGATIVE (ref ?–300)
Opiate, Ur Screen: NEGATIVE (ref ?–300)
Tricyclic, Ur Screen: NEGATIVE (ref ?–1000)

## 2013-03-05 LAB — URINALYSIS, COMPLETE
Bilirubin,UR: NEGATIVE
Blood: NEGATIVE
Glucose,UR: NEGATIVE mg/dL (ref 0–75)
Leukocyte Esterase: NEGATIVE
RBC,UR: 1 /HPF (ref 0–5)
Specific Gravity: 1.024 (ref 1.003–1.030)
Squamous Epithelial: 2
WBC UR: 2 /HPF (ref 0–5)

## 2013-03-05 LAB — CBC
MCH: 27.7 pg (ref 26.0–34.0)
Platelet: 176 10*3/uL (ref 150–440)

## 2013-03-05 LAB — TROPONIN I: Troponin-I: <0.02 ng/mL

## 2013-05-04 ENCOUNTER — Emergency Department: Payer: Self-pay | Admitting: Emergency Medicine

## 2013-05-04 LAB — URINALYSIS, COMPLETE
Bacteria: NONE SEEN
Bilirubin,UR: NEGATIVE
Glucose,UR: NEGATIVE mg/dL (ref 0–75)
LEUKOCYTE ESTERASE: NEGATIVE
NITRITE: NEGATIVE
Ph: 7 (ref 4.5–8.0)
Protein: NEGATIVE
Specific Gravity: 1.027 (ref 1.003–1.030)
Squamous Epithelial: 1
WBC UR: 1 /HPF (ref 0–5)

## 2013-05-04 LAB — BASIC METABOLIC PANEL
Anion Gap: 3 — ABNORMAL LOW (ref 7–16)
BUN: 7 mg/dL (ref 7–18)
CREATININE: 0.6 mg/dL (ref 0.60–1.30)
Calcium, Total: 8.8 mg/dL (ref 8.5–10.1)
Chloride: 108 mmol/L — ABNORMAL HIGH (ref 98–107)
Co2: 29 mmol/L (ref 21–32)
EGFR (African American): >60
EGFR (Non-African Amer.): >60
Glucose: 89 mg/dL (ref 65–99)
OSMOLALITY: 277 (ref 275–301)
POTASSIUM: 3.6 mmol/L (ref 3.5–5.1)
Sodium: 140 mmol/L (ref 136–145)

## 2013-05-04 LAB — CBC
HCT: 35.6 % (ref 35.0–47.0)
HGB: 12.1 g/dL (ref 12.0–16.0)
MCH: 28.7 pg (ref 26.0–34.0)
MCHC: 34 g/dL (ref 32.0–36.0)
MCV: 84 fL (ref 80–100)
Platelet: 174 10*3/uL (ref 150–440)
RBC: 4.22 10*6/uL (ref 3.80–5.20)
RDW: 16.6 % — ABNORMAL HIGH (ref 11.5–14.5)
WBC: 5.5 10*3/uL (ref 3.6–11.0)

## 2013-05-04 LAB — TROPONIN I: Troponin-I: <0.02 ng/mL

## 2013-06-25 IMAGING — US US OB < 14 WEEKS - US OB TV
1 series · 14 of 28 positions shown · non-contrast
Comparison: none

REASON FOR EXAM: approx 10 weeks preg, lower abd pain
COMMENTS:   May transport without cardiac monitor

[Series 1: us ob < 14 weeks - us ob tv · 0.25mm/px · 14 of 63 slices shown]
[im 3/63]
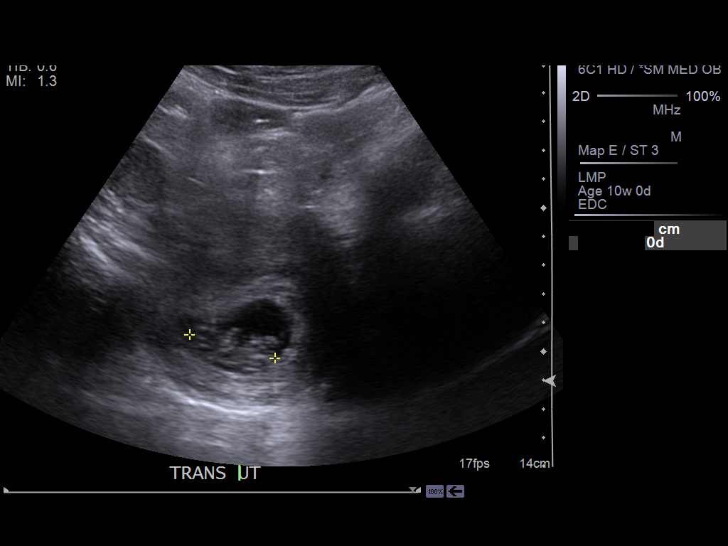
[im 7/63]
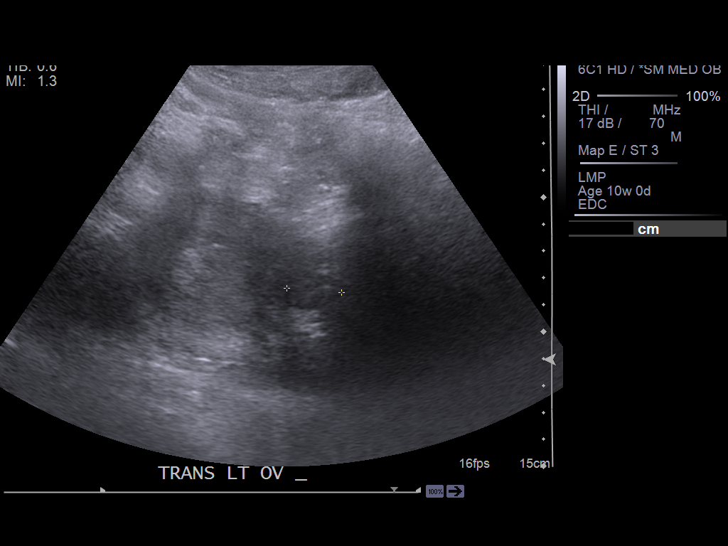
[im 12/63]
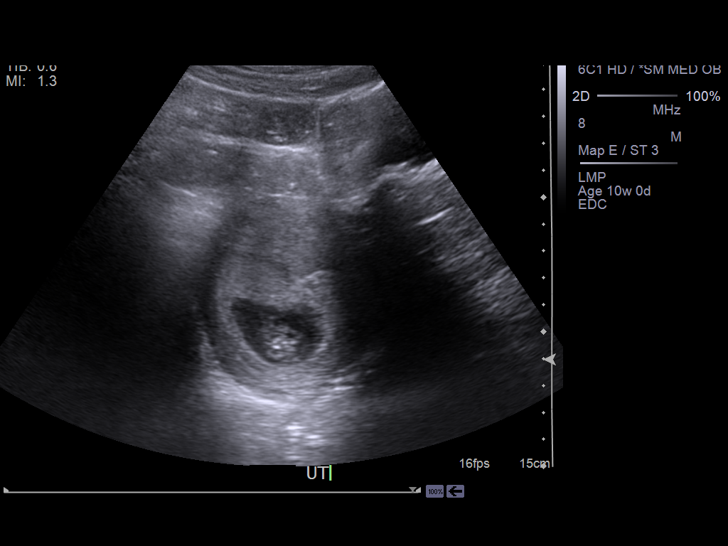
[im 17/63]
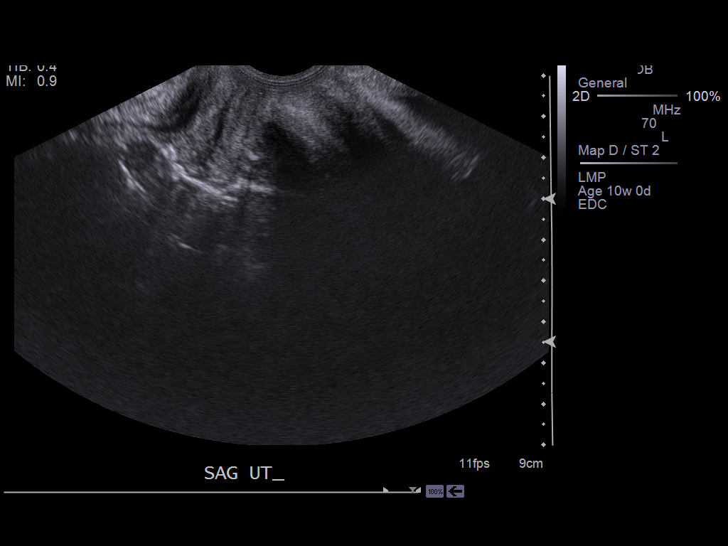
[im 21/63]
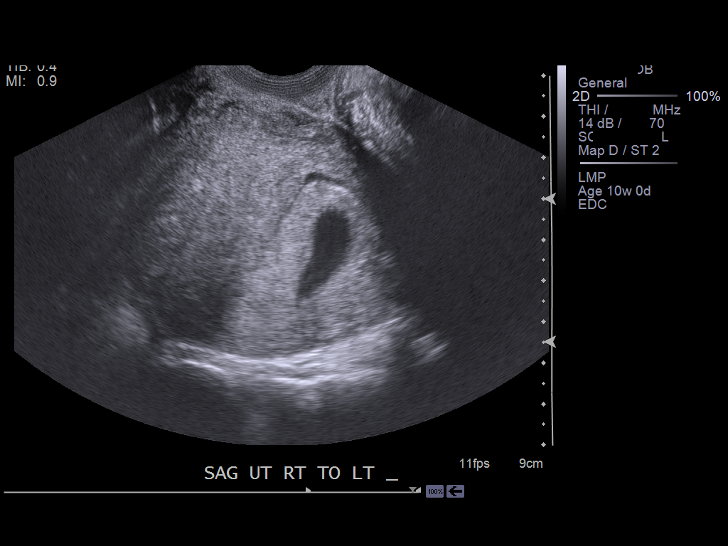
[im 26/63]
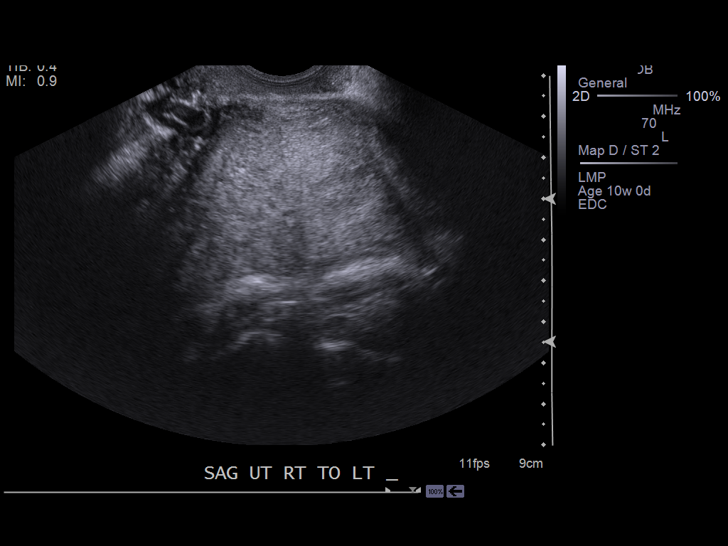
[im 30/63]
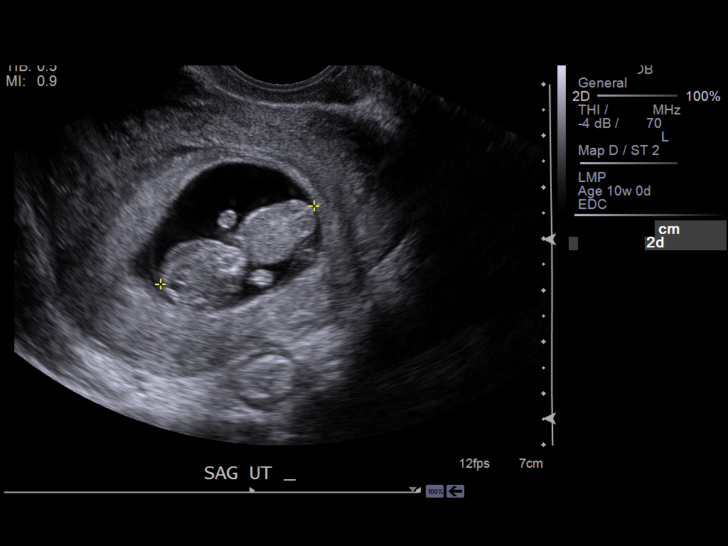
[im 35/63]
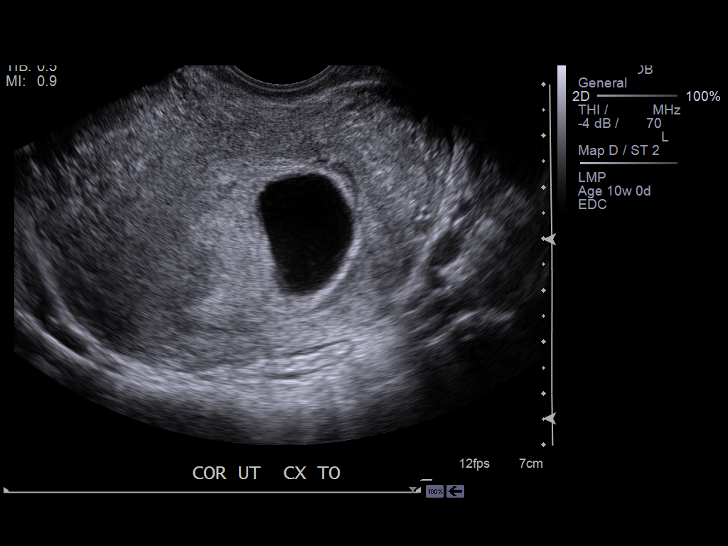
[im 40/63]
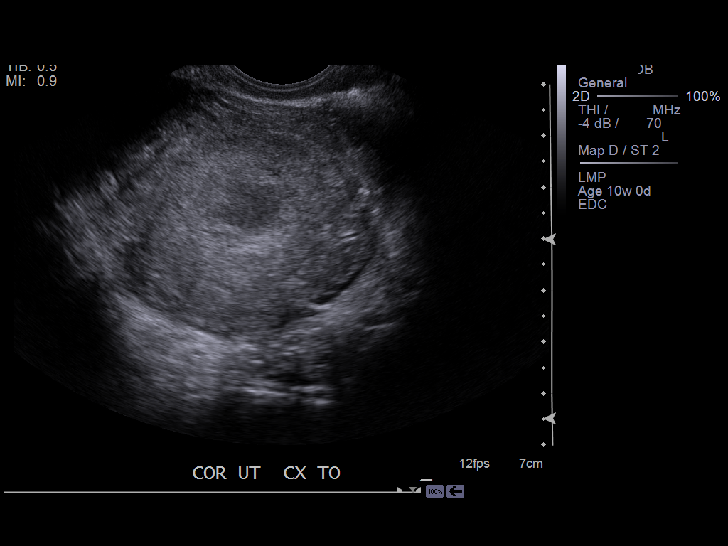
[im 44/63]
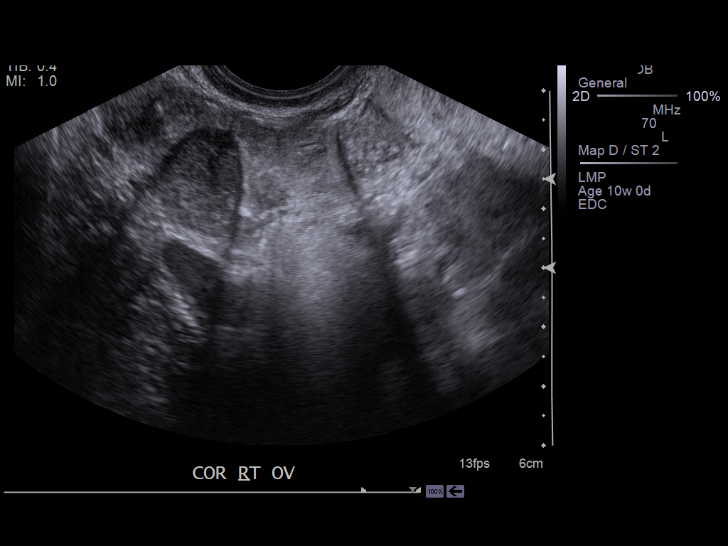
[im 49/63]
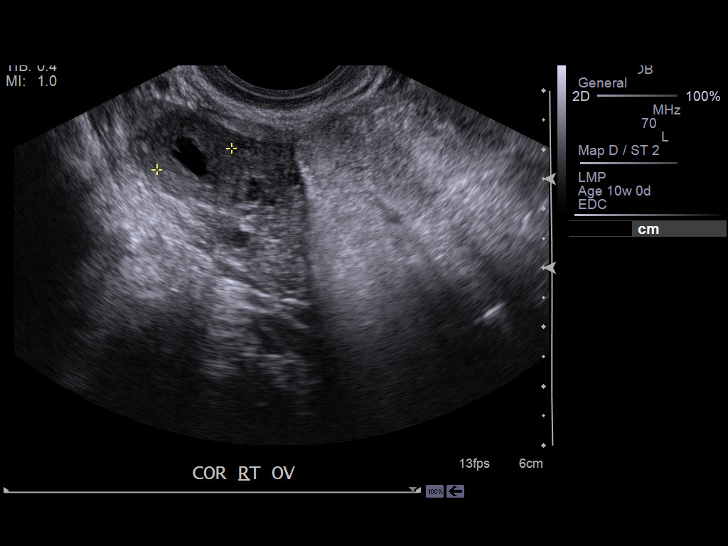
[im 53/63]
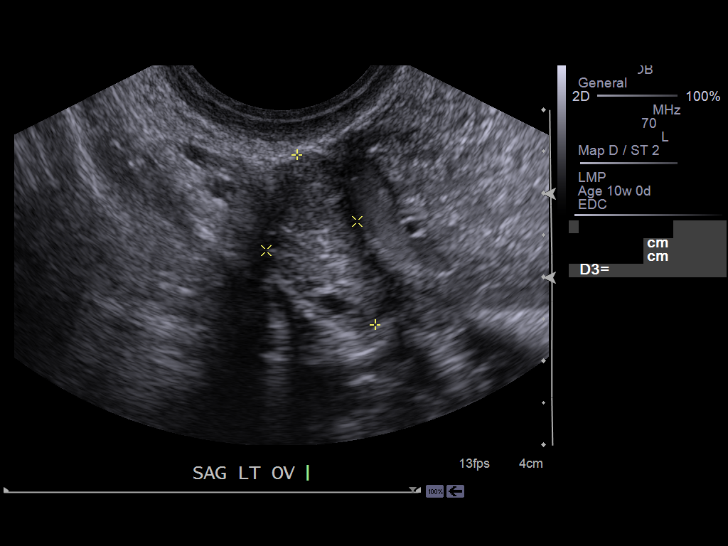
[im 58/63]
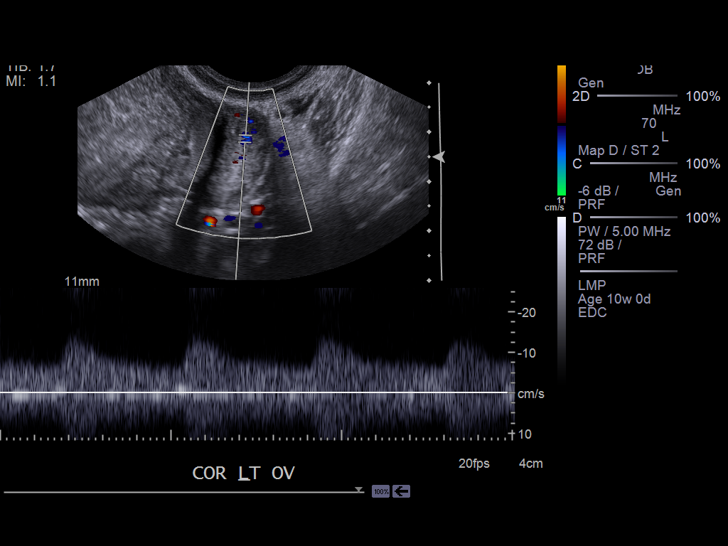
[im 63/63]
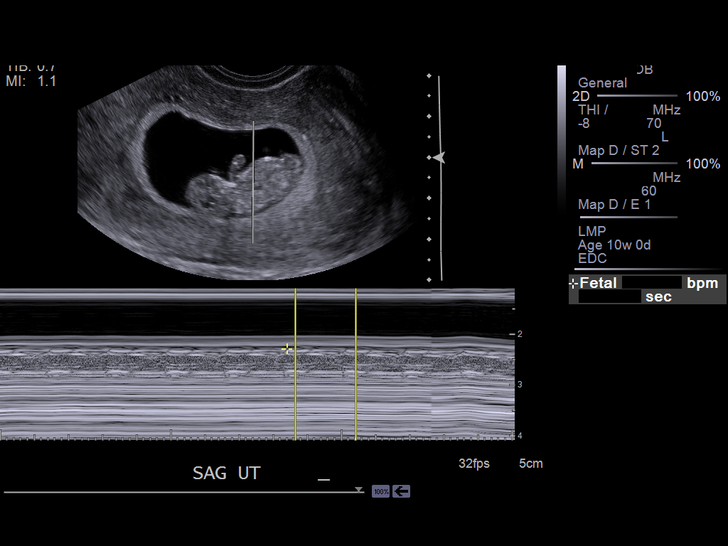

[14 of 28 positions shown; findings below may reference images not displayed]

PROCEDURE:     US  - US OB LESS THAN 14 WEEKS/W TRANS  - November 27, 2011 [DATE]

RESULT:     Transabdominal and transvaginal imaging was performed.

There is a gravid uterus present. The crown-rump length of the fetus is
cm corresponding to a 10 week one day gestation. A fetal cardiac rate of 169
beats permit is demonstrated. A yolk sac is demonstrated and appears normal.
The maternal right ovary measures 4 x 2.2 x 2.5 cm and contains a cyst
measuring 1.8 centimeters in greatest dimension. The left ovary measures
by 1 x 1.1 cm. Vascularity of the ovaries is normal.
IMPRESSION: 1. There is a viable IUP with estimated gestational age of 10 weeks one day
+ / - 6 days. The estimated date of confinement is 22 June, 2012. No
subchorionic hemorrhage is demonstrated.
2. The maternal adnexal structures are within the limits of normal with a
small cyst on the right.

A preliminary report was sent to the [HOSPITAL] the conclusion
of the study.

## 2013-10-01 ENCOUNTER — Emergency Department: Payer: Self-pay | Admitting: Emergency Medicine

## 2013-10-01 LAB — URINALYSIS, COMPLETE
Bilirubin,UR: NEGATIVE
Blood: NEGATIVE
Glucose,UR: NEGATIVE mg/dL (ref 0–75)
Ketone: NEGATIVE
Nitrite: NEGATIVE
PROTEIN: NEGATIVE
Ph: 7 (ref 4.5–8.0)
RBC,UR: 3 /HPF (ref 0–5)
SPECIFIC GRAVITY: 1.029 (ref 1.003–1.030)
WBC UR: 1 /HPF (ref 0–5)

## 2013-10-01 LAB — HCG, QUANTITATIVE, PREGNANCY: Beta Hcg, Quant.: 78359 m[IU]/mL — ABNORMAL HIGH

## 2013-12-20 DIAGNOSIS — Z9889 Other specified postprocedural states: Secondary | ICD-10-CM | POA: Insufficient documentation

## 2014-03-31 ENCOUNTER — Inpatient Hospital Stay: Payer: Self-pay | Admitting: Obstetrics and Gynecology

## 2014-03-31 LAB — DRUG SCREEN, URINE
Amphetamines, Ur Screen: NEGATIVE (ref ?–1000)
Barbiturates, Ur Screen: NEGATIVE (ref ?–200)
Benzodiazepine, Ur Scrn: NEGATIVE (ref ?–200)
COCAINE METABOLITE, UR ~~LOC~~: POSITIVE (ref ?–300)
Cannabinoid 50 Ng, Ur ~~LOC~~: NEGATIVE (ref ?–50)
MDMA (Ecstasy)Ur Screen: NEGATIVE (ref ?–500)
Methadone, Ur Screen: NEGATIVE (ref ?–300)
OPIATE, UR SCREEN: NEGATIVE (ref ?–300)
Phencyclidine (PCP) Ur S: NEGATIVE (ref ?–25)
Tricyclic, Ur Screen: NEGATIVE (ref ?–1000)

## 2014-03-31 LAB — CBC WITH DIFFERENTIAL/PLATELET
BASOS PCT: 0.4 %
Basophil #: 0 10*3/uL (ref 0.0–0.1)
EOS ABS: 0.1 10*3/uL (ref 0.0–0.7)
Eosinophil %: 0.8 %
HCT: 34.1 % — AB (ref 35.0–47.0)
HGB: 11 g/dL — ABNORMAL LOW (ref 12.0–16.0)
Lymphocyte #: 1.4 10*3/uL (ref 1.0–3.6)
Lymphocyte %: 15.9 %
MCH: 28.9 pg (ref 26.0–34.0)
MCHC: 32.2 g/dL (ref 32.0–36.0)
MCV: 90 fL (ref 80–100)
MONOS PCT: 6.5 %
Monocyte #: 0.6 x10 3/mm (ref 0.2–0.9)
Neutrophil #: 6.7 10*3/uL — ABNORMAL HIGH (ref 1.4–6.5)
Neutrophil %: 76.4 %
Platelet: 188 10*3/uL (ref 150–440)
RBC: 3.79 10*6/uL — AB (ref 3.80–5.20)
RDW: 14.3 % (ref 11.5–14.5)
WBC: 8.7 10*3/uL (ref 3.6–11.0)

## 2014-04-01 LAB — HEMATOCRIT: HCT: 28.5 % — ABNORMAL LOW (ref 35.0–47.0)
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
## 2014-08-19 NOTE — Consult Note (Signed)
Brief Consult Note: Diagnosis: Bipolar Disorder, Cocaine Abuse, Cannabis Abuse.   Patient was seen by consultant.   Recommend further assessment or treatment.   Orders entered.   Discussed with Attending MD.   Comments: Mr. Jacqueline Cooke is a 34 y/o AAF with a history of Bipolar Disorder and polysubstance abuse who was admitted to OB/GYN for a D&C. D&C could not be completed as the patient had a toxicology screen (+) for cocaine. She admitted some passive suicidal thoughts to the Chaplain and [facility] that she would not return to the hospital even if she was bleeding. She also got irritated with the OB and told him she wanted to continue using. The patient has been off Lamictal as she said it has not been helping with mood instability. She relapsed on the cocaine and has been using both cocaine and cannabis during her pregancy. This is her 4th miscarriage. Affect was labile and the patient was threatening to not return to the hospital if bleeding. Will place under IVC for now so that a mood stabilizer can be started given recent suicidal threats. Will transfer to Psychiatry for medication management, safety and stabilization.  Electronic Signatures: Caryn SectionKapur, Thorsten Climer (MD)  (Signed 06-Sep-13 12:13)  Authored: Brief Consult Note   Last Updated: 06-Sep-13 12:13 by Caryn SectionKapur, Mathea Frieling (MD)

## 2014-08-22 NOTE — Consult Note (Signed)
PATIENT NAME:  Jacqueline Cooke, [name] MR#:  [id number] DATE OF BIRTH:  10-Oct-1980  DATE OF CONSULTATION:  05/06/2012  REFERRING PHYSICIAN:  Mordecai RasmussenJohn Clapacs, MD   CONSULTING PHYSICIAN:  Felipa Furnaceoberto Sanchez Gutierrez, MD  REASON FOR CONSULTATION: Asthma and chronic obstructive pulmonary disease exacerbation.   REASON FOR ADMISSION TO BEHAVIORAL HEALTH: Increased depression with suicidal and homicidal ideation and pregnancy.    HISTORY OF PRESENT ILLNESS: The patient is a very nice 34 year old female who is pregnant. She just recently found out, which apparently has made her very depressed with suicidal and homicidal ideations. The patient is feeling depressed all the time, anxious, paranoid, afraid to go to sleep; and she has been seeing certain things that she cannot explain at night going around the house. She has significant insomnia, crying spells. She has history of bipolar disorder for which she is not taking medications because she ran out, and she also has a history of significant respiratory distress and failure for which she was intubated, unable to be extubated and having a tracheostomy. She had a pneumonia bilaterally at that moment, and since then she has been having some chronic respiratory problems with occasional exacerbations of what she thinks  is asthma. The patient comes to be admitted by Psychiatry, and right now she is feeling like she is getting really short of breath with increased cough, especially with ambulation.   PAST MEDICAL HISTORY: Acute respiratory distress syndrome with extended  hospitalization, discharged in September 2012. At the moment, the diagnoses are:  1. Acute respiratory failure.  2. Asthma exacerbation.  3. Tobacco abuse.  4. Cocaine abuse.  5. Pregnancy at high risk.  6. Depression.  7. Bipolar disorder.  8. Illicit drug use.  9. Hypertension.  10. History of anemia.  11. History of methicillin-sensitive Staphylococcus aureus  bacteremia.   ALLERGIES: THE  PATIENT IS ALLERGIC TO PENICILLIN, CAUSES HER TO HAVE A RASH.   SOCIAL HISTORY: Single. She has multiple sexual partners. Multiple spontaneous abortions in the past. She has been a smoker, and she quit 3 weeks ago. The patient was reinforced about the need of quitting smoking, especially now that she is pregnant. Positive cocaine use, last time on the 1st of the month.  She usually smokes cocaine rolled up in a cigarette. Positive marijuana use, but she has not used it in the past couple of months. She denies any IV drug abuse. She had 5 or 6 miscarriages and no living children. The patient is currently on probation, and she lives with her mother.  FAMILY HISTORY: Positive for asthma in her mother and brother. She does not know much about her father's part of the family.   CURRENT MEDICATIONS:  1. Albuterol inhaler p.r.n.  2. Prenatal vitamins.  3. Folic acid 1 mg a day. 4. Hydrocortisone cream for skin rash.  5. Singulair 10 mg daily, just added on now.  6. Nitrofurantoin 100 mg every 6 hours for treatment of urinary tract infection.  7. Seroquel 50 mg twice daily.  8. Advair Diskus, just added on now, 250/50.  9. Dexamethasone injection 10 mg, added on now.  10. Tylenol p.r.n.  11. Magnesium sulfate p.r.n.  12. Nebulizers p.r.n. 13. Benadryl p.r.n.   PAST SURGICAL HISTORY: Positive for a tracheostomy. She denies any other procedures.    REVIEW OF SYSTEMS:  CONSTITUTIONAL: A 12-system review of systems is done. No weight loss. No weight gain. No significant pain. Occasional fever and chills.  EYES: No swelling of eyes. No changes in  vision.  ENT: No tinnitus. No difficulty swallowing.  NECK: No pain in the neck. She used to have a tracheostomy which was removed over a year ago.  CARDIOVASCULAR: No chest pain. No tachycardia. No palpitations, no orthopnea.  LUNGS: Positive wheezing. Positive shortness of breath with activity. No hematemesis. Positive cough.  GASTROINTESTINAL: No melena.  No hematemesis. No constipation or diarrhea. GENITOURINARY: Positive burning sensation. Positive  for occasional dysuria and increased urinary frequency. The patient is currently being treated with nitrofurantoin for a urinary infection.  MUSCULOSKELETAL: No significant joint swelling or pain.  NEUROLOGICAL: No ataxia. No lack of sensation. No muscle debility or twitching.  PSYCHIATRIC: Positive for depression, anxiety, and bipolar disorder.  GYNECOLOGICAL: Positive for pregnancy, unknown gestational age.  HEMATOLOGIC/LYMPHATIC: No bleeding. No swollen glands.   PHYSICAL EXAMINATION: VITAL SIGNS: Blood pressure 107/64, pulse 95, respiratory rate 20, temperature 98.2.  GENERAL: The patient is alert, oriented x 3, no acute distress, no respiratory distress, wearing purple scrubs with a jacket. The patient is cooperative and nice.  HEENT: Pupils are equal and reactive. Extraocular movements are intact. Mucosa are moist.  NECK: Supple. No JVD. No thyromegaly. No adenopathy. Positive scar from a trach on anterior neck.  CARDIOVASCULAR: Regular rate and rhythm. No murmurs, rubs, or gallops. No displacement of PMI. No tenderness to palpation of anterior chest.  LUNGS: Positive for some rales on and off with slight end-expiratory wheezing. No dullness to percussion. No use of accessory muscles.  ABDOMEN: Soft, nontender, nondistended. No hepatosplenomegaly. No masses. Bowel sounds are positive.  GENITAL: Deferred.  EXTREMITIES: No edema, no cyanosis, no clubbing. Pulses +2. Capillary refill less than 3.  SKIN: Without any significant rashes or petechiae. Positive multiple tattoos.  MUSCULOSKELETAL: No joint deformities or joint swelling.  PSYCHIATRIC: Mood at this moment is normal. No signs of significant agitation. The patient is alert and oriented x 3.  NEUROLOGIC: Cranial nerves centers II through XII are intact. No focal findings.  LYMPHATICS: Negative for lymphadenopathy in the neck or  supraclavicular area.   LABORATORY, DIAGNOSTIC AND RADIOLOGICAL DATA: Glucose 83, BUN 13, creatinine 0.68. Elevated HGG, the patient has an intrauterine pregnancy apparently.  Normal LFTs. Thyroid stimulating hormone 1.52. Cocaine positive on urine. White count is 14,000. Hemoglobin is 12. Urinalysis shows increased white blood cells at 6 with positive bacteria, positive nitrites and trace leukocyte esterase, positive for urinary infection. The patient is treated with Macrodantin at this moment.   ASSESSMENT AND PLAN: This is a 34 year old female with history of cocaine abuse, bipolar disorder, asthma, possible COPD. The patient has been taking inhalers at home. At this moment, she is going through a mild exacerbation. The patient is a smoker who just quit 2 to 3 weeks ago. We are going to reinforce the need of quitting smoking. The patient used crack cocaine, too. At this moment, with all her risk factors and the history of significant respiratory failure, intubation and tracheostomy, I am going to go ahead and get an x-ray to make sure she does not have any beginnings of pneumonia. I am going to treat her with steroids. I am going to add on Advair Diskus and  Singulair. I will follow up on her improvement.   Urinary tract infection: The patient is treated with Macrodantin, and the dose that she is on 6 times a day, she only requires 3-day treatment. We will follow up on that.   Cocaine abuse: Continue detox program.   Bipolar disorder and depression: As per Dr. Toni Amend'  plan.   TIME SPENT: I spent about 45 minutes talking with the patient, reviewing her records.   CODE STATUS: The patient is a FULL CODE.    Thank you for allowing me to participate in the care of this really nice patient. We are going to continue to follow up along with you.   ____________________________ Felipa Furnace, MD rsg:cb D: 05/06/2012 15:58:35 ET T: 05/06/2012 19:09:55 ET JOB#: [id number]  cc: Felipa Furnace, MD, <Dictator> Daleena Rotter Juanda Chance MD ELECTRONICALLY SIGNED 05/11/2012 13:48

## 2014-08-22 NOTE — Consult Note (Signed)
PATIENT NAME:  Jacqueline Cooke, Jacqueline Cooke MR#:  [id number] DATE OF BIRTH:  1981/03/19  DATE OF CONSULTATION:  05/07/2012  CONSULTING PHYSICIAN:  Jacqueline DeutscherMartin A. Cattaleya Wien, Cooke  CHIEF COMPLAINT: 1. Early pregnancy.  2. History of bipolar disorder with depression and suicidal ideation.  3. Substance abuse.  4. History of chronic obstructive pulmonary disease and asthma.   HISTORY: The patient is a 34 year old single African American female, gravida 6, para 0-0-5-0, last menstrual period uncertain, EGA uncertain, with positive pregnancy test, who was admitted to the Behavioral Medicine floor because of suicidal and homicidal ideations associated with a history of bipolar disorder and a recently diagnosed pregnancy. The patient is a known crack cocaine user. She has no significant early pregnancy symptomatology at this time. The patient is to be transferred to an inpatient behavioral medicine service for detoxification and further management. This facility is reportedly in [place].   PAST MEDICAL HISTORY: 1. History of COPD.  2. Asthma.  3. Chronic bronchitis.  4. Bipolar disorder.  5. Depression.  6. History of illicit drug use and crack cocaine abuse.  7. History of tobacco use.  8. History of hypertension.  9. History of chronic anemia.  10. History of MRSA bacteremia.   PAST SURGICAL HISTORY: Dilation and curettage x 2, tracheostomy in June of 2012 secondary to acute respiratory failure.   PAST OB HISTORY: Para 0-0-5-0, SAB x 5, 2 of which required D and Cs.  One SAB was at [redacted] weeks gestation; the remainder of pregnancies were between 6 and [redacted] weeks gestation.   FAMILY HISTORY: The patient has a sister with a son who reportedly has microcephaly. No other genetic disorders are identified. There is no history of sickle cell in her family.   SOCIAL HISTORY: The patient is a chronic tobacco user, smoking 1 to 2 packs a day for the past 14 years. She reports social alcohol use and does admit to crack  cocaine use. She has used THC in the past.   CURRENT MEDICATIONS: Tylenol, Albuterol inhaler, folic acid 1 mg a day, status post flu vaccine, milk of magnesia, Macrodantin, Zofran, Benadryl, hydrocortisone cream, Advair inhaler, Singulair and Seroquel.   REVIEW OF SYSTEMS: A 12-point review of systems is negative.   PHYSICAL EXAMINATION: Deferred.    LABORATORY DATA: Review of laboratory data is notable for a positive UPT screen for cocaine. Urinalysis was notable for positive nitrites and bacteria. (The patient is on Macrodantin.)   IMPRESSION: 1. Early pregnancy, gestational age unknown.  2. History of bipolar disorder with suicidal ideation.  3. History of substance use including crack cocaine and tetrahydrocannabinol.  4. History of chronic obstructive pulmonary disease, asthma, chronic bronchitis, with history of acute respiratory failure requiring tracheotomy.  5.  UTi treated with Macrobid  PLAN:  1. Pelvic ultrasound to determine gestational age.  2. Continue prenatal vitamins with folic acid.  3. Continue with Behavioral Medicine management per Dr. Toni Amendlapacs. The patient is likely to be moved to an inpatient treatment program for detox in [place]. She needs to get established with prenatal care at that facility, or she may return here for re-establishment of prenatal care with me. The patient did understand that we will utilize the [facility] to help with her substance use issues.  ____________________________ Jacqueline DockerMartin A. Arif Amendola, Cooke mad:cb D: 05/07/2012 16:16:42 ET T: 05/07/2012 16:34:33 ET JOB#: [id number] cc: Jacqueline DeutscherMartin A. Jacqueline Kem, Cooke, <Dictator> Jacqueline DockerMARTIN A Jacqueline Cooke ELECTRONICALLY SIGNED 05/13/2012 18:43

## 2014-08-22 NOTE — Consult Note (Signed)
Brief Consult Note: Diagnosis: asthma exacerbation / copd/.   Patient was seen by consultant.   Consult note dictated.   Comments: steroids inhaled and IM once. nebs.  Electronic Signatures: Berlinda LastSanchez Gutierrez, Regan Rakersoberto (MD)  (Signed 05-Jan-14 15:57)  Authored: Brief Consult Note   Last Updated: 05-Jan-14 15:57 by Berlinda LastSanchez Gutierrez, Regan Rakersoberto (MD)
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
## 2014-08-22 NOTE — Consult Note (Signed)
Brief Consult Note: Diagnosis: depression.   Patient was seen by consultant.   Consult note dictated.   Recommend further assessment or treatment.   Orders entered.   Discussed with Attending MD.   Comments: Psychiatry: Depressed, pregnant, abusing cocaine. Non-complient with treatment and in a bad living situation. Poor health. Will admit.  Electronic Signatures: Audery Amellapacs, John T (MD)  (Signed 03-Jan-14 15:22)  Authored: Brief Consult Note   Last Updated: 03-Jan-14 15:22 by Audery Amellapacs, John T (MD)

## 2014-08-23 NOTE — Op Note (Signed)
PATIENT NAME:  Jacqueline Cooke, Jacqueline Cooke MR#:  [id number] DATE OF BIRTH:  04-23-1981  DATE OF PROCEDURE:  03/31/2014  PREOPERATIVE DIAGNOSES:  84.  A 34 year old G2, P1, 0-0-1 at 11 weeks' 4 days' gestation.  2.  History of prior low transverse cesarean section.  3.  Nonreassuring fetal surveillance with a biophysical profile of 4/10.  4.  Cocaine-positive on admission.   POSTOPERATIVE DIAGNOSES: 65.  A 33 year old G2, P1, 0-0-1 at 84 weeks' 4 days' gestation.  2.  History of prior low transverse cesarean section.  3.  Nonreassuring fetal surveillance with a biophysical profile of 4/10.  4.  Cocaine-positive on admission.   OPERATION PERFORMED: Repeat low transverse cesarean section via Pfannenstiel skin incision.  ANESTHESIA USED: Spinal.  PRIMARY SURGEON: Florina Ou. Bonney Aid, MD  ASSISTANT: Gasper Lloyd. Sharen Hones, CNM  PREOPERATIVE ANTIBIOTICS: 2 g of Ancef.  DRAINS OR TUBES: Foley to gravity drainage, On-Q catheter system.  IMPLANTS: None.   ESTIMATED BLOOD LOSS: 900 mL  OPERATIVE FLUIDS: 800 mL of crystalloid.   URINE OUTPUT: 100 mL of clear urine.   COMPLICATIONS: None.   INTRAOPERATIVE FINDINGS: Normal tubes, ovaries, and uterus. There is moderate scarring of the rectus fascia encountered. Delivery resulted in the birth of a liveborn female infant weighing 2730 g, 6 pounds 0 ounces, Apgars 8 and 9.   SPECIMENS REMOVED: None.  CONDITION FOLLOWING THE PROCEDURE: Stable.   PROCEDURE IN DETAIL: Risks, benefits, and alternatives of the procedure were discussed with the patient prior to proceeding to the operating room. The patient was taken to the operating room where she placed under spinal anesthesia. She was positioned in the supine position, prepped and draped in the usual sterile fashion. Timeout was performed. The level of anesthetic was checked and noted to be adequate prior to proceeding with the case. A Pfannenstiel skin incision was made 2 cm above the pubic symphysis  utilizing the patient's pre-existing scar. This was carried down sharply to the level of the rectus fascia using the scalpel. The fascia was incised in the midline and then extended using Mayo scissors. The superior border of the rectus fascia was grasped with 2 Kocher clamps. The underlying rectus muscles were dissected off the fascia bluntly, the median raphe incised using Mayo scissors. The inferior border of the rectus fascia was dissected off the rectus muscle in a similar fashion. The peritoneal cavity was then identified. The peritoneum was grasped and tented up with a hemostat before being incised with Metzenbaum scissors. The peritoneal incision was then extended using manual traction. A bladder blade was placed. Bladder flap was then developed using Metzenbaum scissors, further developed digitally before replacing the bladder blade to displace the bladder caudally. A low transverse incision was then scored on the lower uterine segment. This was entered bluntly using the operator's finger after scoring. The hysterotomy incision was extended using manual traction.  Upon placing the operator's hand into the hysterotomy incision, the infant was noted to be in the vertex position but somewhat high within the uterus. The vertex was able to be brought somewhat to the incision, but there was some difficulty in delivering, at which point the decision was made to proceed with vacuum extraction, which was successful on the second attempt with 1 pop off. The remainder of the body delivered with ease. The infant was suctioned, cord was clamped and cut, and the infant was passed to the waiting pediatrician. The placenta was delivered using manual extraction. The uterus was wiped clean of clots and debris.  It was repaired in situ in a 2- layer closure of 0 Vicryl, with the first being a running locked, the second a vertical imbricating. There was an aspect on the left portion of the hysterotomy incision which contained  a bleeding vessel which was oversewn with a single figure-of-eight with good hemostasis noted thereafter. There was a small nonexpanding hematoma next to this. The peritoneal gutters were wiped clean of clots and debris using moist laps. The peritoneum was closed using a 2-0 Vicryl in a running fashion. The muscles were reapproximated in the midline using a 2-0 Vicryl mattress stitch. The On-Q catheters were then placed subfascially per the usual protocol. The fascia was closed using a looped #1 PDS in a running fashion. Subcutaneous tissue was irrigated. Hemostasis was achieved using the Bovie. Skin was closed using 4-0 Monocryl suture in a subcuticular fashion. Each On-Q catheter was then bolused with 5 mL of 0.5% bupivacaine each. Sponge, needle, and instrument counts were correct x 2. The patient tolerated the procedure well and was taken to the recovery room in stable condition.   ____________________________ Florina OuAndreas M. Bonney AidStaebler, MD ams:ST D: 03/31/2014 21:56:38 ET T: 03/31/2014 22:41:04 ET JOB#: [id number]  cc: Florina OuAndreas M. Bonney AidStaebler, MD, <Dictator> Carmel SacramentoANDREAS Cathrine MusterM Chanler Schreiter MD ELECTRONICALLY SIGNED 04/12/2014 7:13

## 2014-08-24 NOTE — Consult Note (Signed)
Referral Information:   Reason for Referral Multiple issues    Referring Physician [facility]    Prenatal Hx 34 year old G4P0030 with EDC of 06/26/2012 (8 0/7 weeks) presents for consultation for multiple issues. 1) Recurrent pregnancy loss 2) Depression / bipolar disorder 3) COPD with chronic bronchitis and asthma 4) Severe cocaine addiction (Of note, the patient did not volunteer this history. I only learned of this after the patient left, and I was able to review her hospitalizations. Also of note, the cocaine addiction may be a strong factor in 1), 2), and 3) ).  The patient stated that she has asthma and bronchitis. Today, she was coughing. She takes albuterol prn. When I asked her about previous surgeries, she stated that she had a tracheostomy. When I asked her about the events surrounding that, she stated, "my lungs collapsed." She was in an ICU for approximately 1 month -- June-Jul 2012. She stated that she did not know why this happened. She states that currently she see no one for her pulmonary issues.  After the patient left, I was able to review her medical history. In June 2012, she was admitted to the ICU for respiratory failure, bronchospasm, and ARDS all of which were secondary to crack cocaine use. During this admission,she was intubated, extubated, and had to be re-intubated. She eventually needed a tracheostomy. She was also diagnosed with depression and bipolar disorder. She was started on Lexipro. In Sept 2012, she was admitted with respiratory failure. During this admission she had a positive drug screen for cocaine. In Oct 2012, she presented to the ED requesting detox from cocaine. During this stay, she had the third of pregnancies diagnosed as a missed AB.    Past Obstetrical Hx G1 -- 2011; SAB at 6 weeks G2 -- 2012; SAB at 6 weeks diagnosed at the time the patient had a positive urine screen for cocaine, when she was seeking medical intervention for her cocaine  addiction. Patient required D&C. G3 -- 2013; SAB at 6 weeks. Patient required D&C. These three pregnancies were fathered by same man. Current pregnancy is fathered by another man.   Home Medications: Medication Instructions Status  albuterol nebulizer   every 4 hours and PRN Active  Prenatal Multivitamins oral tablet 1 tab(s) orally once a day Active   Allergies:   Penicillin: Rash  Vital Signs/Notes:  Nursing Vital Signs: **Vital Signs.:   15-Jul-13 10:04   Vital Signs Type Routine   Temperature Temperature (F) 98.2   Celsius 36.7   Temperature Source oral   Pulse Pulse 88   Pulse source if not from Vital Sign Device dinamap   Respirations Respirations 16   Systolic BP Systolic BP 97   Diastolic BP (mmHg) Diastolic BP (mmHg) 59   Mean BP 71   BP Source  if not from Vital Sign Device dinamap   Impression/Recommendations:   Impression 1) IUP at 8 weeks 2) Habitual aborter 3) COPD / chronic lung disease / probable lung injury from smoking cocaine 4) Depression / bipolar disorder    Recommendations 1) I ordered screening labs for antiphospholipid antibodies. 2) Referral to psychiatry (needs to be scheduled) 3) Pulmonary function testing (needs to scheduled) 4) Return in 2 weeks to discuss findings 5) Ultrasound for viability in 2 weeks (scheduled)     Total Time Spent with Patient 30 minutes    >50% of visit spent in couseling/coordination of care yes    Office Use Only [address]  Level 2 ( ) NEW office  consult exp prob focused   Coding Description: MATERNAL CONDITIONS/HISTORY INDICATION(S).   Drug Use:  Alcohol or Cocaine.   OTHER: Habitual aborter.  Electronic Signatures: Marcelino ScotBrancazio, Lisl Slingerland (MD)  (Signed 15-Jul-13 11:03)  Authored: Referral, Home Medications, Allergies, Vital Signs/Notes, Impression, Billing, Coding Description   Last Updated: 15-Jul-13 11:03 by Marcelino ScotBrancazio, Serai Tukes (MD)

## 2014-09-09 NOTE — H&P (Signed)
L&D Evaluation:  History:  HPI 34 year old G 6 P1041 with EDC12/17/2015 by LMP=07/11/2013 and c/w a 8wk5day ultrasound (EDC=04/13/2014) presents at 37 4/7 weeks after a nonreactive NST in office today for further monitoring and evaluation. Denies VB, LOF, regular ctxs. Reports baby has been moving. [facility] and c/b hx of prior C-section now 15 mos ago (close interconceptual spacing), hx of cocaine abuse with incarcerations for same with last preganancy, and COPD/asthma with hx of a trach. Has been followed with growth scans and antepartum testing in the third trimester and baby's growth has been normal (36.9th% at 34wk4d) and antepartum testing has been normal until today. A repeat CS is scheduled for 12/11. Patient also desires TL with this CS and her 30 day papers have been signed. LABS: O POS, RI, VI, GBS negative. UTD on  flu and TDAP vaccines.   Presents with NR NST   Patient's Medical History Asthma  severe chronic asthma,  ARF/Pneumonia 2012, bipolar disorder (was on serroquel and celexa at start of pregnancy-no meds currently)   Patient's Surgical History D&C  Tracheotomy   Medications Pre Natal Vitamins  Pulmacort and Albuterol (not currently using)   Allergies PCN, rash   Social History tobacco  drugs  few cigs/day, past cocaine use   Family History Non-Contributory   ROS:  ROS see HPI   Exam:  Vital Signs stable  103/75, 122/64   Urine Protein not completed   General no apparent distress   Mental Status clear   Chest clear   Heart normal sinus rhythm, no murmur/gallop/rubs   Abdomen gravid, non-tender   Estimated Fetal Weight Average for gestational age, 536 1/2#   Fetal Position cephalic   Edema no edema   Reflexes 1+   Mebranes Intact, AFI=9.21cm (1.7+2..38+2.85+3.28)   FHT 135 with mod variability-nonreactive after 2 hours and fluids/food   Ucx irregular, occ, mild   Skin dry   Other BPP: TOne 0/0, FM 0/0, AF2/2, fetal  breathing 2/2 with nonreactive NST. BPP=4/10   Impression:  Impression IUP at 37 4/7 weeks with BPP 4/10. Prior CS.   Plan:  Plan Discussedcase with Dr Bonney AidStaebler. Continuous EFM. UDS.  Keep NPO for posssible repeat CS later today.   Electronic Signatures: Trinna BalloonGutierrez, Zi Sek L (CNM)  (Signed [phone]-Nov-15 17:14)  Authored: L&D Evaluation   Last Updated: 30-Nov-15 17:14 by Trinna BalloonGutierrez, Quartez Lagos L (CNM)

## 2014-09-09 NOTE — H&P (Signed)
L&D Evaluation:  History Expanded:  HPI 34 yo 165P0040 AA F at 28 weeks w limited Prenatal Care due to incarceration this pregnancy and h/o drug use including cocaine, recently repeat incarceration but game w deputy today to Labor and Delivery with lower abd pain.  Recent cocaine use admitted to.  No vaginal bleeding.   Patient's Medical History Asthma   Patient's Surgical History none   Medications None   Allergies PCN   Social History tobacco  drugs   Family History Non-Contributory   ROS:  ROS All systems were reviewed.  HEENT, CNS, GI, GU, Respiratory, CV, Renal and Musculoskeletal systems were found to be normal.   Exam:  Vital Signs stable   General no apparent distress   Mental Status clear   Abdomen gravid, non-tender   Estimated Fetal Weight Average for gestational age   Back no CVAT   Edema no edema   Pelvic no external lesions, cervix closed and thick   Mebranes Intact   FHT normal rate with no decels   Ucx absent   Impression:  Impression Abd pain, no signs or symptoms of Preterm Labor   Plan:  Plan EFM/NST   Comments Follow up in office as scheduled. Fluids Rest. Counseled urgently to CEASE all drug use.   Electronic Signatures: Letitia LibraHarris, Alainah Phang Paul (MD)  (Signed 13-Jun-14 17:35)  Authored: L&D Evaluation   Last Updated: 13-Jun-14 17:35 by Letitia LibraHarris, Derik Fults Paul (MD)

## 2015-02-25 ENCOUNTER — Inpatient Hospital Stay: Admission: RE | Admit: 2015-02-25 | Payer: Medicaid Other | Source: Ambulatory Visit

## 2015-03-10 ENCOUNTER — Other Ambulatory Visit: Payer: Medicaid Other

## 2015-03-16 ENCOUNTER — Inpatient Hospital Stay: Admission: RE | Admit: 2015-03-16 | Payer: Medicaid Other | Source: Ambulatory Visit

## 2015-03-19 ENCOUNTER — Encounter: Admission: RE | Payer: Self-pay | Source: Ambulatory Visit

## 2015-03-19 ENCOUNTER — Ambulatory Visit
Admission: RE | Admit: 2015-03-19 | Payer: Medicaid Other | Source: Ambulatory Visit | Admitting: Obstetrics and Gynecology

## 2015-03-19 SURGERY — LIGATION, FALLOPIAN TUBE, LAPAROSCOPIC
Anesthesia: Choice | Laterality: Bilateral

## 2016-08-16 ENCOUNTER — Encounter: Payer: Self-pay | Admitting: Obstetrics and Gynecology

## 2016-08-16 ENCOUNTER — Ambulatory Visit: Payer: Self-pay | Admitting: Obstetrics and Gynecology

## 2017-07-17 ENCOUNTER — Emergency Department
Admission: EM | Admit: 2017-07-17 | Discharge: 2017-07-17 | Disposition: A | Discharge status: Home / Self Care | Payer: Medicaid Other | Attending: Emergency Medicine | Admitting: Emergency Medicine

## 2017-07-17 ENCOUNTER — Emergency Department: Payer: Medicaid Other

## 2017-07-17 ENCOUNTER — Encounter: Payer: Self-pay | Admitting: Emergency Medicine

## 2017-07-17 ENCOUNTER — Other Ambulatory Visit: Payer: Self-pay

## 2017-07-17 DIAGNOSIS — I10 Essential (primary) hypertension: Secondary | ICD-10-CM | POA: Diagnosis not present

## 2017-07-17 DIAGNOSIS — J45909 Unspecified asthma, uncomplicated: Secondary | ICD-10-CM | POA: Insufficient documentation

## 2017-07-17 DIAGNOSIS — J449 Chronic obstructive pulmonary disease, unspecified: Secondary | ICD-10-CM | POA: Diagnosis not present

## 2017-07-17 DIAGNOSIS — R1010 Upper abdominal pain, unspecified: Secondary | ICD-10-CM | POA: Insufficient documentation

## 2017-07-17 DIAGNOSIS — K920 Hematemesis: Secondary | ICD-10-CM | POA: Insufficient documentation

## 2017-07-17 DIAGNOSIS — F17210 Nicotine dependence, cigarettes, uncomplicated: Secondary | ICD-10-CM | POA: Insufficient documentation

## 2017-07-17 LAB — POCT PREGNANCY, URINE: Preg Test, Ur: NEGATIVE

## 2017-07-17 LAB — CBC
HCT: 48.6 % — ABNORMAL HIGH (ref 35.0–47.0)
Hemoglobin: 16.5 g/dL — ABNORMAL HIGH (ref 12.0–16.0)
MCH: 32.3 pg (ref 26.0–34.0)
MCHC: 33.9 g/dL (ref 32.0–36.0)
MCV: 95.3 fL (ref 80.0–100.0)
PLATELETS: 240 10*3/uL (ref 150–440)
RBC: 5.1 MIL/uL (ref 3.80–5.20)
RDW: 13.4 % (ref 11.5–14.5)
WBC: 13.4 10*3/uL — AB (ref 3.6–11.0)

## 2017-07-17 LAB — LIPASE, BLOOD: LIPASE: 25 U/L (ref 11–51)

## 2017-07-17 LAB — COMPREHENSIVE METABOLIC PANEL
ALT: 11 U/L — AB (ref 14–54)
AST: 21 U/L (ref 15–41)
Albumin: 4.9 g/dL (ref 3.5–5.0)
Alkaline Phosphatase: 58 U/L (ref 38–126)
Anion gap: 10 (ref 5–15)
BUN: 18 mg/dL (ref 6–20)
CHLORIDE: 102 mmol/L (ref 101–111)
CO2: 20 mmol/L — ABNORMAL LOW (ref 22–32)
CREATININE: 0.78 mg/dL (ref 0.44–1.00)
Calcium: 9.1 mg/dL (ref 8.9–10.3)
Glucose, Bld: 130 mg/dL — ABNORMAL HIGH (ref 65–99)
Potassium: 3.8 mmol/L (ref 3.5–5.1)
Sodium: 132 mmol/L — ABNORMAL LOW (ref 135–145)
TOTAL PROTEIN: 8.7 g/dL — AB (ref 6.5–8.1)
Total Bilirubin: 1 mg/dL (ref 0.3–1.2)

## 2017-07-17 LAB — URINALYSIS, COMPLETE (UACMP) WITH MICROSCOPIC
BILIRUBIN URINE: NEGATIVE
GLUCOSE, UA: NEGATIVE mg/dL
KETONES UR: NEGATIVE mg/dL
LEUKOCYTES UA: NEGATIVE
NITRITE: NEGATIVE
PH: 5 (ref 5.0–8.0)
Protein, ur: NEGATIVE mg/dL
SPECIFIC GRAVITY, URINE: 1.032 — AB (ref 1.005–1.030)

## 2017-07-17 MED ORDER — ONDANSETRON HCL 4 MG PO TABS
4.0000 mg | ORAL_TABLET | Freq: Once | ORAL | Status: AC
  Administered 2017-07-17: 4 mg via ORAL
  Filled 2017-07-17 (×2): qty 1

## 2017-07-17 MED ORDER — IOPAMIDOL (ISOVUE-300) INJECTION 61%
100.0000 mL | Freq: Once | INTRAVENOUS | Status: AC | PRN
  Administered 2017-07-17: 100 mL via INTRAVENOUS
  Filled 2017-07-17: qty 100

## 2017-07-17 MED ORDER — GI COCKTAIL ~~LOC~~
30.0000 mL | Freq: Once | ORAL | Status: AC
  Administered 2017-07-17: 30 mL via ORAL
  Filled 2017-07-17: qty 30

## 2017-07-17 MED ORDER — ONDANSETRON HCL 4 MG PO TABS: 4.0000 mg | ORAL_TABLET | Freq: Every day | ORAL | 0 refills | Status: DC | PRN

## 2017-07-17 MED ORDER — SUCRALFATE 1 G PO TABS: 1.0000 g | ORAL_TABLET | Freq: Four times a day (QID) | ORAL | 0 refills | Status: DC

## 2017-07-17 MED ORDER — SODIUM CHLORIDE 0.9 % IV BOLUS (SEPSIS)
1000.0000 mL | Freq: Once | INTRAVENOUS | Status: AC
  Administered 2017-07-17: 1000 mL via INTRAVENOUS

## 2017-07-17 MED ORDER — PANTOPRAZOLE SODIUM 40 MG IV SOLR
40.0000 mg | Freq: Once | INTRAVENOUS | Status: AC
  Administered 2017-07-17: 40 mg via INTRAVENOUS
  Filled 2017-07-17: qty 40

## 2017-07-17 MED ORDER — OMEPRAZOLE 40 MG PO CPDR: 40.0000 mg | DELAYED_RELEASE_CAPSULE | Freq: Every day | ORAL | 0 refills | Status: DC

## 2017-07-17 NOTE — ED Triage Notes (Signed)
Nausea, vomiting and abd pain began today.

## 2017-07-17 NOTE — ED Provider Notes (Addendum)
[facility] Emergency Department Provider Note  ___________________________________________   First MD Initiated Contact with Patient 07/17/17 1751     (approximate)  I have reviewed the triage vital signs and the nursing notes.   HISTORY  Chief Complaint Abdominal Pain and Emesis   HPI Jacqueline Cooke is a 37 y.o. female with a history of COPD, depression as well as hypertension and leukocytosis was presenting to the emergency department today with upper abdominal pain which she says feels like a cramping. The pain is nonradiating. Says that she vomited several times earlier today and that the last time the vomit looked like a thin, bloody vomitus without clots. She denies daily drinking. Denies any diarrhea. Denies any sick contacts. Says the pain is a 10 out of 10 right now and feels better when she bends forward and worsens when she sits up straight. She says that she also ate spaghetti yesterday.patient denies any black or bloody stools.patient says that she is not an everyday drinker.   Past Medical History:  Diagnosis Date  . Anemia   . Asthma   . COPD (chronic obstructive pulmonary disease) (HCC)   . Depression   . Hyperphosphatemia   . Hypertension   . Leukocytosis   . Substance abuse (HCC)    History of Cocaine abuse  . Tachycardia     Patient Active Problem List   Diagnosis Date Noted  . Tachycardia 12/09/2010  . SOB (shortness of breath) 12/09/2010    Past Surgical History:  Procedure Laterality Date  . CESAREAN SECTION  01/04/2013  . D[facility]  [facility]  . TRACHEOSTOMY  10/2010    Prior to Admission medications   Medication Sig Start Date End Date Taking? Authorizing Provider  albuterol (PROVENTIL,VENTOLIN) 90 MCG/ACT inhaler Inhale 2 puffs into the lungs every 6 (six) hours as needed for wheezing. 12/09/10 12/04/11  Antonieta IbaGollan, Timothy J, MD    Allergies Penicillins cross  reactors  No family history on file.  Social History Social History   Tobacco Use  . Smoking status: Current Every Day Smoker    Packs/day: 0.10    Types: Cigarettes  Substance Use Topics  . Alcohol use: Yes    Comment: social  . Drug use: Yes    Types: "Crack" cocaine, Marijuana    Comment: History of Marijuana, Cocaine some days    Review of Systems  Constitutional: No fever/chills Eyes: No visual changes. ENT: No sore throat. Cardiovascular: Denies chest pain. Respiratory: Denies shortness of breath. Gastrointestinal: No diarrhea.  No constipation. Genitourinary: Negative for dysuria. Musculoskeletal: Negative for back pain. Skin: Negative for rash. Neurological: Negative for headaches, focal weakness or numbness.   ____________________________________________   PHYSICAL EXAM:  VITAL SIGNS: ED Triage Vitals  Enc Vitals Group     BP 07/17/17 1651 103/87     Pulse Rate 07/17/17 1650 (!) 108     Resp 07/17/17 1650 20     Temp 07/17/17 1650 98 F (36.7 C)     Temp Source 07/17/17 1650 Oral     SpO2 07/17/17 1651 96 %     Weight 07/17/17 1651 135 lb (61.2 kg)     Height 07/17/17 1651 5\' 6"  (1.676 m)     Head Circumference --      Peak Flow --      Pain Score 07/17/17 1651 10     Pain Loc --  Pain Edu? --      Excl. in GC? --     Constitutional: Alert and oriented. no distress. Eyes: Conjunctivae are normal.  Head: Atraumatic. Nose: No congestion/rhinnorhea. Mouth/Throat: Mucous membranes are moist.  Neck: No stridor.   Cardiovascular: Normal rate, regular rhythm. Grossly normal heart sounds.  Respiratory: Normal respiratory effort.  No retractions. Lungs CTAB. Gastrointestinal: Soft with moderate tenderness across her abdomen without any rigidity, rebound or guarding. No distention. No CVA tenderness.no lower abdominal tenderness palpation including any tenderness over McBurney's point. Musculoskeletal: No lower extremity tenderness nor edema.  No  joint effusions. Neurologic:  Normal speech and language. No gross focal neurologic deficits are appreciated. Skin:  Skin is warm, dry and intact. No rash noted. Psychiatric: Mood and affect are normal. Speech and behavior are normal.  ____________________________________________   LABS (all labs ordered are listed, but only abnormal results are displayed)  Labs Reviewed  COMPREHENSIVE METABOLIC PANEL - Abnormal; Notable for the following components:      Result Value   Sodium 132 (*)    CO2 20 (*)    Glucose, Bld 130 (*)    Total Protein 8.7 (*)    ALT 11 (*)    All other components within normal limits  CBC - Abnormal; Notable for the following components:   WBC 13.4 (*)    Hemoglobin 16.5 (*)    HCT 48.6 (*)    All other components within normal limits  URINALYSIS, COMPLETE (UACMP) WITH MICROSCOPIC - Abnormal; Notable for the following components:   Color, Urine YELLOW (*)    APPearance HAZY (*)    Specific Gravity, Urine 1.032 (*)    Hgb urine dipstick SMALL (*)    Bacteria, UA RARE (*)    Squamous Epithelial / LPF 0-5 (*)    All other components within normal limits  LIPASE, BLOOD  POC URINE PREG, ED  POCT PREGNANCY, URINE   ____________________________________________  EKG   ____________________________________________  RADIOLOGY  normal examination of the abdomen and pelvis with contrast. ____________________________________________   PROCEDURES  Procedure(s) performed:   Procedures  Critical Care performed:   ____________________________________________   INITIAL IMPRESSION / ASSESSMENT AND PLAN / ED COURSE  Pertinent labs & imaging results that were available during my care of the patient were reviewed by me and considered in my medical decision making (see chart for details).  Differential diagnosis includes, but is not limited to, biliary disease (biliary colic, acute cholecystitis, cholangitis, choledocholithiasis, etc), intrathoracic  causes for epigastric abdominal pain including ACS, gastritis, duodenitis, pancreatitis, small bowel or large bowel obstruction, abdominal aortic aneurysm, hernia, and gastritis. As part of my medical decision making, I reviewed the following data within the electronic MEDICAL RECORD NUMBER Notes from prior ED visits  patient has documented history of leukocytosis but the last several white blood cell counts of been normal.  ----------------------------------------- 7:29 PM on 07/17/2017 -----------------------------------------  Patient says the pain is down to a 5 out of 10 from a 10 out of 10 at this time. Elevated blood cell count. We'll proceed with CAT scan as well as give Protonix for further pain control. Patient without any distress this time. Sitting straight up in bed and managing her 2 children whom are in the room.  ----------------------------------------- 8:51 PM on 07/17/2017 -----------------------------------------  Patient updated about the CAT scan results. She says that she still a 5 out of 10 pain. However, she is requesting dinner [place] tolerating by mouth fluids at this time. She is without distress.  Likely gastritis/Mallory-Weiss tear. No further vomiting. Patient appears stable and comfortable for discharge at this time. We'll discharge with omeprazole as well as sucralfate and Zofran. Patient to return for any worsening or concerning symptoms. Will be discharged at this time. ____________________________________________   FINAL CLINICAL IMPRESSION(S) / ED DIAGNOSES  upper abdominal pain. nausea and vomiting.    NEW MEDICATIONS STARTED DURING THIS VISIT:  New Prescriptions   No medications on file     Note:  This document was prepared using Dragon voice recognition software and may include unintentional dictation errors.     Myrna Blazer, MD 07/17/17 2052    Myrna Blazer, MD 07/17/17 2055

## 2017-07-31 ENCOUNTER — Ambulatory Visit: Payer: Medicaid Other | Admitting: Gastroenterology

## 2018-01-28 DIAGNOSIS — G43009 Migraine without aura, not intractable, without status migrainosus: Secondary | ICD-10-CM | POA: Insufficient documentation

## 2018-01-28 DIAGNOSIS — Z720 Tobacco use: Secondary | ICD-10-CM | POA: Insufficient documentation

## 2018-09-25 DIAGNOSIS — J4540 Moderate persistent asthma, uncomplicated: Secondary | ICD-10-CM | POA: Insufficient documentation

## 2018-10-24 ENCOUNTER — Ambulatory Visit: Payer: Medicaid Other | Admitting: Obstetrics and Gynecology

## 2019-02-09 ENCOUNTER — Encounter: Payer: Self-pay | Admitting: Emergency Medicine

## 2019-02-09 ENCOUNTER — Emergency Department
Admission: EM | Admit: 2019-02-09 | Discharge: 2019-02-09 | Disposition: A | Discharge status: Home / Self Care | Payer: Medicaid Other | Attending: Emergency Medicine | Admitting: Emergency Medicine

## 2019-02-09 ENCOUNTER — Other Ambulatory Visit: Payer: Self-pay

## 2019-02-09 ENCOUNTER — Emergency Department: Payer: Medicaid Other

## 2019-02-09 DIAGNOSIS — F17210 Nicotine dependence, cigarettes, uncomplicated: Secondary | ICD-10-CM | POA: Insufficient documentation

## 2019-02-09 DIAGNOSIS — R109 Unspecified abdominal pain: Secondary | ICD-10-CM

## 2019-02-09 DIAGNOSIS — Z79899 Other long term (current) drug therapy: Secondary | ICD-10-CM | POA: Insufficient documentation

## 2019-02-09 DIAGNOSIS — N12 Tubulo-interstitial nephritis, not specified as acute or chronic: Secondary | ICD-10-CM | POA: Insufficient documentation

## 2019-02-09 DIAGNOSIS — I10 Essential (primary) hypertension: Secondary | ICD-10-CM | POA: Insufficient documentation

## 2019-02-09 DIAGNOSIS — J449 Chronic obstructive pulmonary disease, unspecified: Secondary | ICD-10-CM | POA: Diagnosis not present

## 2019-02-09 LAB — COMPREHENSIVE METABOLIC PANEL
ALT: 8 U/L (ref 0–44)
AST: 11 U/L — ABNORMAL LOW (ref 15–41)
Albumin: 3.8 g/dL (ref 3.5–5.0)
Alkaline Phosphatase: 57 U/L (ref 38–126)
Anion gap: 9 (ref 5–15)
BUN: 13 mg/dL (ref 6–20)
CO2: 25 mmol/L (ref 22–32)
Calcium: 8.4 mg/dL — ABNORMAL LOW (ref 8.9–10.3)
Chloride: 104 mmol/L (ref 98–111)
Creatinine, Ser: 0.78 mg/dL (ref 0.44–1.00)
GFR calc Af Amer: >60 mL/min (ref >60)
GFR calc non Af Amer: >60 mL/min (ref >60)
Glucose, Bld: 100 mg/dL — ABNORMAL HIGH (ref 70–99)
Potassium: 3.6 mmol/L (ref 3.5–5.1)
Sodium: 138 mmol/L (ref 135–145)
Total Bilirubin: 0.8 mg/dL (ref 0.3–1.2)
Total Protein: 6.4 g/dL — ABNORMAL LOW (ref 6.5–8.1)

## 2019-02-09 LAB — URINALYSIS, COMPLETE (UACMP) WITH MICROSCOPIC
Bacteria, UA: NONE SEEN
Bilirubin Urine: NEGATIVE
Glucose, UA: NEGATIVE mg/dL
Hgb urine dipstick: NEGATIVE
Ketones, ur: NEGATIVE mg/dL
Nitrite: NEGATIVE
Protein, ur: 100 mg/dL — AB
Specific Gravity, Urine: 1.03 (ref 1.005–1.030)
pH: 8 (ref 5.0–8.0)

## 2019-02-09 LAB — LIPASE, BLOOD: Lipase: 16 U/L (ref 11–51)

## 2019-02-09 LAB — POCT PREGNANCY, URINE: Preg Test, Ur: NEGATIVE

## 2019-02-09 LAB — CBC
HCT: 38.1 % (ref 36.0–46.0)
Hemoglobin: 13.1 g/dL (ref 12.0–15.0)
MCH: 32.5 pg (ref 26.0–34.0)
MCHC: 34.4 g/dL (ref 30.0–36.0)
MCV: 94.5 fL (ref 80.0–100.0)
Platelets: 220 10*3/uL (ref 150–400)
RBC: 4.03 MIL/uL (ref 3.87–5.11)
RDW: 11.9 % (ref 11.5–15.5)
WBC: 13.5 10*3/uL — ABNORMAL HIGH (ref 4.0–10.5)
nRBC: 0 % (ref 0.0–0.2)

## 2019-02-09 MED ORDER — LACTATED RINGERS IV BOLUS
1000.0000 mL | Freq: Once | INTRAVENOUS | Status: AC
  Administered 2019-02-09: 20:00:00 1000 mL via INTRAVENOUS

## 2019-02-09 MED ORDER — SODIUM CHLORIDE 0.9 % IV SOLN
1.0000 g | Freq: Once | INTRAVENOUS | Status: AC
  Administered 2019-02-09: 1 g via INTRAVENOUS
  Filled 2019-02-09: qty 10

## 2019-02-09 MED ORDER — KETOROLAC TROMETHAMINE 30 MG/ML IJ SOLN
15.0000 mg | Freq: Once | INTRAMUSCULAR | Status: AC
  Administered 2019-02-09: 15 mg via INTRAVENOUS
  Filled 2019-02-09: qty 1

## 2019-02-09 MED ORDER — MORPHINE SULFATE (PF) 4 MG/ML IV SOLN
4.0000 mg | Freq: Once | INTRAVENOUS | Status: AC
  Administered 2019-02-09: 4 mg via INTRAVENOUS
  Filled 2019-02-09: qty 1

## 2019-02-09 MED ORDER — ONDANSETRON HCL 4 MG/2ML IJ SOLN
4.0000 mg | Freq: Once | INTRAMUSCULAR | Status: AC
  Administered 2019-02-09: 4 mg via INTRAVENOUS
  Filled 2019-02-09: qty 2

## 2019-02-09 MED ORDER — CEPHALEXIN 500 MG PO CAPS: 1000.0000 mg | ORAL_CAPSULE | Freq: Two times a day (BID) | ORAL | 0 refills | Status: AC

## 2019-02-09 MED ORDER — SODIUM CHLORIDE 0.9% FLUSH
3.0000 mL | Freq: Once | INTRAVENOUS | Status: AC
  Administered 2019-02-09: 3 mL via INTRAVENOUS

## 2019-02-09 MED ORDER — ONDANSETRON 4 MG PO TBDP: 4.0000 mg | ORAL_TABLET | Freq: Three times a day (TID) | ORAL | 0 refills | Status: DC | PRN

## 2019-02-09 NOTE — ED Notes (Addendum)
Pt c/o of lower abdominal pain with swelling "feels like I'm pregnant", c/o blood in urine, also some pain in right flank, pt reporting pain as 20/10  Pt reports swelling and and frequency for the last 7 weeks, denies PCP care, pt NAD, calm and talking to roommate in room and on phone, reports 2 prior pregnancies, denies regular meds  Pt requesting "soda drink"

## 2019-02-09 NOTE — ED Notes (Signed)
Peripheral IV discontinued. Catheter intact. No signs of infiltration or redness. Gauze applied to IV site.    Discharge instructions reviewed with patient. Questions fielded by this RN. Patient verbalizes understanding of instructions. Patient discharged home in stable condition per jessup. No acute distress noted at time of discharge.   

## 2019-02-09 NOTE — ED Triage Notes (Signed)
Lower abdominal and back pain started today.

## 2019-02-09 NOTE — ED Provider Notes (Signed)
[facility] Emergency Department Provider Note   ____________________________________________   First MD Initiated Contact with Patient 02/09/19 1941     (approximate)  I have reviewed the triage vital signs and the nursing notes.   HISTORY  Chief Complaint Abdominal Pain and Back Pain    HPI Jacqueline Cooke is a 38 y.o. female with past medical history of asthma and substance abuse presents to the ED complaining of flank pain and abdominal pain.  Patient reports approximately 48 hours of gradually worsening pain in her suprapubic area and right flank.  She describes this as a constant sharp pain that seems to worsen with changes in position.  She reports some chills, but denies any fevers.  She has had some nausea, but denies any vomiting.  She denies any hematuria, dysuria, or urinary frequency.  She does not have a history of kidney stones.        Past Medical History:  Diagnosis Date  . Anemia   . Asthma   . COPD (chronic obstructive pulmonary disease) (HCC)   . Depression   . Hyperphosphatemia   . Hypertension   . Leukocytosis   . Substance abuse (HCC)    History of Cocaine abuse  . Tachycardia     Patient Active Problem List   Diagnosis Date Noted  . Tachycardia 12/09/2010  . SOB (shortness of breath) 12/09/2010    Past Surgical History:  Procedure Laterality Date  . CESAREAN SECTION  01/04/2013  . DILATION AND CURETTAGE OF UTERUS  2012   [facility]  2013 [facility]  . TRACHEOSTOMY  10/2010    Prior to Admission medications   Medication Sig Start Date End Date Taking? Authorizing Provider  albuterol (PROVENTIL,VENTOLIN) 90 MCG/ACT inhaler Inhale 2 puffs into the lungs every 6 (six) hours as needed for wheezing. 12/09/10 12/04/11  Antonieta Iba, MD  cephALEXin (KEFLEX) 500 MG capsule Take 2 capsules (1,000 mg total) by mouth 2 (two) times daily for 10 days. 02/09/19 02/19/19  Chesley Noon, MD  omeprazole (PRILOSEC) 40 MG  capsule Take 1 capsule (40 mg total) by mouth daily. 07/17/17 07/17/18  Myrna Blazer, MD  ondansetron (ZOFRAN ODT) 4 MG disintegrating tablet Take 1 tablet (4 mg total) by mouth every 8 (eight) hours as needed for nausea or vomiting. 02/09/19   Chesley Noon, MD  ondansetron (ZOFRAN) 4 MG tablet Take 1 tablet (4 mg total) by mouth daily as needed. 07/17/17   Schaevitz, Myra Rude, MD  sucralfate (CARAFATE) 1 g tablet Take 1 tablet (1 g total) by mouth 4 (four) times daily. 07/17/17 07/17/18  Myrna Blazer, MD    Allergies Penicillins cross reactors  No family history on file.  Social History Social History   Tobacco Use  . Smoking status: Current Every Day Smoker    Packs/day: 0.10    Types: Cigarettes  Substance Use Topics  . Alcohol use: Yes    Comment: social  . Drug use: Yes    Types: "Crack" cocaine, Marijuana    Comment: History of Marijuana, Cocaine some days    Review of Systems  Constitutional: No fever/chills Eyes: No visual changes. ENT: No sore throat. Cardiovascular: Denies chest pain. Respiratory: Denies shortness of breath. Gastrointestinal: Positive for abdominal pain.  Positive for nausea, no vomiting.  No diarrhea.  No constipation.  Positive for flank pain. Genitourinary: Negative for dysuria. Musculoskeletal: Negative for back pain. Skin: Negative for rash. Neurological: Negative for headaches, focal weakness or numbness.  ____________________________________________   PHYSICAL EXAM:  VITAL SIGNS: ED Triage Vitals  Enc Vitals Group     BP 02/09/19 1801 116/71     Pulse Rate 02/09/19 1801 96     Resp 02/09/19 1801 16     Temp 02/09/19 1801 98.9 F (37.2 C)     Temp Source 02/09/19 1801 Oral     SpO2 02/09/19 1801 97 %     Weight 02/09/19 1810 150 lb (68 kg)     Height 02/09/19 1810 5\' 10"  (1.778 m)     Head Circumference --      Peak Flow --      Pain Score 02/09/19 1810 10     Pain Loc --      Pain Edu? --       Excl. in [place]? --     Constitutional: Alert and oriented. Eyes: Conjunctivae are normal. Head: Atraumatic. Nose: No congestion/rhinnorhea. Mouth/Throat: Mucous membranes are moist. Neck: Normal ROM Cardiovascular: Normal rate, regular rhythm. Grossly normal heart sounds. Respiratory: Normal respiratory effort.  No retractions. Lungs CTAB. Gastrointestinal: Soft and tender to palpation in suprapubic area with no rebound or guarding.  Right CVA tenderness, no left CVA tenderness. No distention. Genitourinary: deferred Musculoskeletal: No lower extremity tenderness nor edema. Neurologic:  Normal speech and language. No gross focal neurologic deficits are appreciated. Skin:  Skin is warm, dry and intact. No rash noted. Psychiatric: Mood and affect are normal. Speech and behavior are normal.  ____________________________________________   LABS (all labs ordered are listed, but only abnormal results are displayed)  Labs Reviewed  COMPREHENSIVE METABOLIC PANEL - Abnormal; Notable for the following components:      Result Value   Glucose, Bld 100 (*)    Calcium 8.4 (*)    Total Protein 6.4 (*)    AST 11 (*)    All other components within normal limits  CBC - Abnormal; Notable for the following components:   WBC 13.5 (*)    All other components within normal limits  URINALYSIS, COMPLETE (UACMP) WITH MICROSCOPIC - Abnormal; Notable for the following components:   Color, Urine YELLOW (*)    APPearance HAZY (*)    Protein, ur 100 (*)    Leukocytes,Ua SMALL (*)    All other components within normal limits  URINE CULTURE  LIPASE, BLOOD  POC URINE PREG, ED  POCT PREGNANCY, URINE     PROCEDURES  Procedure(s) performed (including Critical Care):  Procedures   ____________________________________________   INITIAL IMPRESSION / ASSESSMENT AND PLAN / ED COURSE       38 year old female presents to the ED complaining of 48 hours of gradually worsening suprapubic and right flank  pain.  Clinically appears consistent with pyelonephritis, however patient denies any urinary symptoms.  UA does show evidence of infection, will culture.  Will obtain CT to assess for stone disease, otherwise plan to treat for pyelonephritis.  No right lower quadrant tenderness to suggest appendicitis.  CT with no evidence of nephrolithiasis or hydronephrosis, does show perivesicular stranding consistent with UTI.  Will treat with Rocephin here in the ED followed by Keflex as patient has previously tolerated Ancef.  Counseled patient to follow-up with PCP and return to the ED for new or worsening symptoms, patient agrees with plan.      ____________________________________________   FINAL CLINICAL IMPRESSION(S) / ED DIAGNOSES  Final diagnoses:  Pyelonephritis  Flank pain     ED Discharge Orders         Ordered  cephALEXin (KEFLEX) 500 MG capsule  2 times daily     02/09/19 2211    ondansetron (ZOFRAN ODT) 4 MG disintegrating tablet  Every 8 hours PRN     02/09/19 2211           Note:  This document was prepared using Dragon voice recognition software and may include unintentional dictation errors.   Chesley NoonJessup, Fayette Gasner, MD 02/09/19 2246

## 2019-02-09 NOTE — ED Notes (Signed)
Patient transported to CT 

## 2019-02-11 LAB — URINE CULTURE

## 2019-03-01 ENCOUNTER — Ambulatory Visit: Payer: Medicaid Other | Admitting: Obstetrics and Gynecology

## 2019-03-20 ENCOUNTER — Encounter: Payer: Self-pay | Admitting: Obstetrics and Gynecology

## 2019-03-20 ENCOUNTER — Other Ambulatory Visit: Payer: Self-pay

## 2019-03-20 ENCOUNTER — Ambulatory Visit (INDEPENDENT_AMBULATORY_CARE_PROVIDER_SITE_OTHER): Payer: Medicaid Other | Admitting: Obstetrics and Gynecology

## 2019-03-20 VITALS — BP 119/85 | HR 76 | Ht 68.0 in | Wt 154.0 lb

## 2019-03-20 DIAGNOSIS — N912 Amenorrhea, unspecified: Secondary | ICD-10-CM

## 2019-03-20 DIAGNOSIS — Z23 Encounter for immunization: Secondary | ICD-10-CM | POA: Diagnosis not present

## 2019-03-20 DIAGNOSIS — Z3202 Encounter for pregnancy test, result negative: Secondary | ICD-10-CM | POA: Diagnosis not present

## 2019-03-20 DIAGNOSIS — Z130 Encounter for screening for diseases of the blood and blood-forming organs and certain disorders involving the immune mechanism: Secondary | ICD-10-CM

## 2019-03-20 LAB — POCT URINE PREGNANCY: Preg Test, Ur: NEGATIVE
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
## 2019-03-20 NOTE — Patient Instructions (Signed)
 [facility]   [place], [place] [address] [place], [place] [address] [phone]

## 2019-03-21 LAB — CBC
Hematocrit: 42 % (ref 34.0–46.6)
Hemoglobin: 13.9 g/dL (ref 11.1–15.9)
MCH: 31.7 pg (ref 26.6–33.0)
MCHC: 33.1 g/dL (ref 31.5–35.7)
MCV: 96 fL (ref 79–97)
Platelets: 236 10*3/uL (ref 150–450)
RBC: 4.38 x10E6/uL (ref 3.77–5.28)
RDW: 11.8 % (ref 11.7–15.4)
WBC: 7.6 10*3/uL (ref 3.4–10.8)

## 2019-03-21 LAB — BETA HCG QUANT (REF LAB): hCG Quant: <1 m[IU]/mL

## 2019-03-22 ENCOUNTER — Telehealth: Payer: Self-pay

## 2019-03-22 NOTE — Telephone Encounter (Signed)
Patient calling for 03/20/2019 lab results. [phone]

## 2019-03-22 NOTE — Telephone Encounter (Signed)
Spoke w/patient. Advised results are back but not yet reviewed. AMS out of office today. She is most interested in Hcg level. Advised will have in office provider review.

## 2019-03-22 NOTE — Telephone Encounter (Signed)
Called patient with beta HCG results: <1 or negative. Patient had no questions. Dalia Heading, CNM

## 2019-03-22 NOTE — Telephone Encounter (Signed)
Patient calling to inquire about lab results again.

## 2019-03-23 NOTE — Telephone Encounter (Signed)
Not sure if this was meant for you since you saw her last..Marland KitchenMarland Kitchen

## 2019-04-08 ENCOUNTER — Ambulatory Visit: Payer: Medicaid Other | Admitting: Obstetrics and Gynecology

## 2019-10-20 ENCOUNTER — Emergency Department: Payer: Medicaid Other

## 2019-10-20 ENCOUNTER — Other Ambulatory Visit: Payer: Self-pay

## 2019-10-20 ENCOUNTER — Encounter: Payer: Self-pay | Admitting: Radiology

## 2019-10-20 ENCOUNTER — Emergency Department
Admission: EM | Admit: 2019-10-20 | Discharge: 2019-10-20 | Discharge status: Against Medical Advice | Payer: Medicaid Other | Attending: Emergency Medicine | Admitting: Emergency Medicine

## 2019-10-20 DIAGNOSIS — J449 Chronic obstructive pulmonary disease, unspecified: Secondary | ICD-10-CM | POA: Diagnosis not present

## 2019-10-20 DIAGNOSIS — N739 Female pelvic inflammatory disease, unspecified: Secondary | ICD-10-CM | POA: Diagnosis not present

## 2019-10-20 DIAGNOSIS — F17210 Nicotine dependence, cigarettes, uncomplicated: Secondary | ICD-10-CM | POA: Insufficient documentation

## 2019-10-20 DIAGNOSIS — R102 Pelvic and perineal pain: Secondary | ICD-10-CM

## 2019-10-20 DIAGNOSIS — I10 Essential (primary) hypertension: Secondary | ICD-10-CM | POA: Insufficient documentation

## 2019-10-20 DIAGNOSIS — R1030 Lower abdominal pain, unspecified: Secondary | ICD-10-CM | POA: Diagnosis present

## 2019-10-20 DIAGNOSIS — N730 Acute parametritis and pelvic cellulitis: Secondary | ICD-10-CM

## 2019-10-20 DIAGNOSIS — Z79899 Other long term (current) drug therapy: Secondary | ICD-10-CM | POA: Insufficient documentation

## 2019-10-20 LAB — CBC
HCT: 41.4 % (ref 36.0–46.0)
Hemoglobin: 14.9 g/dL (ref 12.0–15.0)
MCH: 34.2 pg — ABNORMAL HIGH (ref 26.0–34.0)
MCHC: 36 g/dL (ref 30.0–36.0)
MCV: 95 fL (ref 80.0–100.0)
Platelets: 201 10*3/uL (ref 150–400)
RBC: 4.36 MIL/uL (ref 3.87–5.11)
RDW: 12.1 % (ref 11.5–15.5)
WBC: 23.7 10*3/uL — ABNORMAL HIGH (ref 4.0–10.5)
nRBC: 0 % (ref 0.0–0.2)

## 2019-10-20 LAB — COMPREHENSIVE METABOLIC PANEL
ALT: 10 U/L (ref 0–44)
AST: 20 U/L (ref 15–41)
Albumin: 3.7 g/dL (ref 3.5–5.0)
Alkaline Phosphatase: 60 U/L (ref 38–126)
Anion gap: 10 (ref 5–15)
BUN: 8 mg/dL (ref 6–20)
CO2: 20 mmol/L — ABNORMAL LOW (ref 22–32)
Calcium: 8.8 mg/dL — ABNORMAL LOW (ref 8.9–10.3)
Chloride: 105 mmol/L (ref 98–111)
Creatinine, Ser: 0.81 mg/dL (ref 0.44–1.00)
GFR calc Af Amer: >60 mL/min (ref >60)
GFR calc non Af Amer: >60 mL/min (ref >60)
Glucose, Bld: 96 mg/dL (ref 70–99)
Potassium: 3.8 mmol/L (ref 3.5–5.1)
Sodium: 135 mmol/L (ref 135–145)
Total Bilirubin: 1.3 mg/dL — ABNORMAL HIGH (ref 0.3–1.2)
Total Protein: 6.7 g/dL (ref 6.5–8.1)

## 2019-10-20 LAB — URINALYSIS, COMPLETE (UACMP) WITH MICROSCOPIC
Bacteria, UA: NONE SEEN
Bilirubin Urine: NEGATIVE
Glucose, UA: NEGATIVE mg/dL
Ketones, ur: 5 mg/dL — AB
Nitrite: NEGATIVE
Protein, ur: NEGATIVE mg/dL
Specific Gravity, Urine: 1.011 (ref 1.005–1.030)
WBC, UA: >50 WBC/hpf — ABNORMAL HIGH (ref 0–5)
pH: 5 (ref 5.0–8.0)

## 2019-10-20 LAB — WET PREP, GENITAL
Sperm: NONE SEEN
Trich, Wet Prep: NONE SEEN
Yeast Wet Prep HPF POC: NONE SEEN

## 2019-10-20 LAB — CHLAMYDIA/NGC RT PCR (ARMC ONLY)
Chlamydia Tr: NOT DETECTED
N gonorrhoeae: DETECTED — AB

## 2019-10-20 LAB — LIPASE, BLOOD: Lipase: 17 U/L (ref 11–51)

## 2019-10-20 LAB — POCT PREGNANCY, URINE: Preg Test, Ur: NEGATIVE

## 2019-10-20 MED ORDER — METRONIDAZOLE 500 MG PO TABS: 500.0000 mg | ORAL_TABLET | Freq: Two times a day (BID) | ORAL | 0 refills | Status: DC

## 2019-10-20 MED ORDER — ONDANSETRON 4 MG PO TBDP
4.0000 mg | ORAL_TABLET | Freq: Once | ORAL | Status: AC | PRN
  Administered 2019-10-20: 4 mg via ORAL
  Filled 2019-10-20: qty 1

## 2019-10-20 MED ORDER — DOXYCYCLINE HYCLATE 100 MG PO TABS: 100.0000 mg | ORAL_TABLET | Freq: Two times a day (BID) | ORAL | 0 refills | Status: DC

## 2019-10-20 MED ORDER — KETOROLAC TROMETHAMINE 30 MG/ML IJ SOLN
30.0000 mg | Freq: Once | INTRAMUSCULAR | Status: AC
  Administered 2019-10-20: 30 mg via INTRAVENOUS
  Filled 2019-10-20: qty 1

## 2019-10-20 MED ORDER — IOHEXOL 300 MG/ML  SOLN
100.0000 mL | Freq: Once | INTRAMUSCULAR | Status: AC | PRN
  Administered 2019-10-20: 100 mL via INTRAVENOUS
  Filled 2019-10-20: qty 100

## 2019-10-20 MED ORDER — OXYCODONE-ACETAMINOPHEN 5-325 MG PO TABS
1.0000 | ORAL_TABLET | ORAL | Status: DC | PRN
  Administered 2019-10-20: 1 via ORAL
  Filled 2019-10-20: qty 1

## 2019-10-20 MED ORDER — MORPHINE SULFATE (PF) 4 MG/ML IV SOLN
4.0000 mg | Freq: Once | INTRAVENOUS | Status: DC
  Filled 2019-10-20: qty 1

## 2019-10-20 MED ORDER — ONDANSETRON HCL 4 MG/2ML IJ SOLN
4.0000 mg | Freq: Once | INTRAMUSCULAR | Status: AC
  Administered 2019-10-20: 4 mg via INTRAVENOUS
  Filled 2019-10-20: qty 2

## 2019-10-20 MED ORDER — SODIUM CHLORIDE 0.9 % IV SOLN
1000.0000 mL | Freq: Once | INTRAVENOUS | Status: AC
  Administered 2019-10-20: 1000 mL via INTRAVENOUS

## 2019-10-20 MED ORDER — MORPHINE SULFATE (PF) 4 MG/ML IV SOLN
4.0000 mg | Freq: Once | INTRAVENOUS | Status: AC
  Administered 2019-10-20: 4 mg via INTRAVENOUS
  Filled 2019-10-20: qty 1

## 2019-10-20 MED ORDER — TRAMADOL HCL 50 MG PO TABS: 50.0000 mg | ORAL_TABLET | Freq: Four times a day (QID) | ORAL | 0 refills | Status: DC | PRN

## 2019-10-20 MED ORDER — CLINDAMYCIN PHOSPHATE 900 MG/50ML IV SOLN
900.0000 mg | Freq: Once | INTRAVENOUS | Status: AC
  Administered 2019-10-20: 900 mg via INTRAVENOUS
  Filled 2019-10-20: qty 50

## 2019-10-20 MED ORDER — GENTAMICIN SULFATE 40 MG/ML IJ SOLN
2.5000 mg/kg | Freq: Once | INTRAVENOUS | Status: DC
  Filled 2019-10-20: qty 4

## 2019-10-20 MED ORDER — MORPHINE SULFATE (PF) 4 MG/ML IV SOLN
4.0000 mg | Freq: Once | INTRAVENOUS | Status: AC
  Administered 2019-10-20: 4 mg via INTRAVENOUS

## 2019-10-20 NOTE — ED Provider Notes (Signed)
[facility] Emergency Department Provider Note   ____________________________________________    I have reviewed the triage vital signs and the nursing notes.   HISTORY  Chief Complaint Abdominal Pain     HPI Jacqueline Cooke is a 39 y.o. female with history as noted below who presents with complaints of lower abdominal pain.  Patient reports pain seems to start relatively abruptly yesterday primarily in her lower abdomen/pelvic area.  She reports the pain seems to have worsened over the last 24 hours.  No history of the same.  Has not take anything for this.  Denies vaginal discharge.  No dysuria although some urinary urgency noted.  No nausea or vomiting.  Reports pain is worse with lying flat  Past Medical History:  Diagnosis Date  . Anemia   . Asthma   . COPD (chronic obstructive pulmonary disease) ([place])   . Depression   . Hyperphosphatemia   . Hypertension   . Leukocytosis   . Substance abuse ([place])    History of Cocaine abuse  . Tachycardia     Patient Active Problem List   Diagnosis Date Noted  . Tachycardia 12/09/2010  . SOB (shortness of breath) 12/09/2010    Past Surgical History:  Procedure Laterality Date  . CESAREAN SECTION  01/04/2013  . DILATION AND CURETTAGE OF UTERUS  2012   [facility]  2013 [facility]  . TRACHEOSTOMY  10/2010    Prior to Admission medications   Medication Sig Start Date End Date Taking? Authorizing Provider  albuterol (PROVENTIL,VENTOLIN) 90 MCG/ACT inhaler Inhale 2 puffs into the lungs every 6 (six) hours as needed for wheezing. 12/09/10 12/04/11  Minna Merritts, MD  doxycycline (VIBRA-TABS) 100 MG tablet Take 1 tablet (100 mg total) by mouth 2 (two) times daily. 10/20/19   Lavonia Drafts, MD  metroNIDAZOLE (FLAGYL) 500 MG tablet Take 1 tablet (500 mg total) by mouth 2 (two) times daily after a meal. 10/20/19   Lavonia Drafts, MD  traMADol (ULTRAM) 50 MG tablet Take 1 tablet (50 mg total) by mouth  every 6 (six) hours as needed. 10/20/19 10/19/20  Lavonia Drafts, MD     Allergies Penicillins cross reactors  No family history on file.  Social History Social History   Tobacco Use  . Smoking status: Current Every Day Smoker    Packs/day: 0.10    Types: Cigarettes  Substance Use Topics  . Alcohol use: Yes    Comment: social  . Drug use: Yes    Types: "Crack" cocaine, Marijuana    Comment: History of Marijuana, Cocaine some days    Review of Systems  Constitutional: No fever/chills Eyes: No visual changes.  ENT: No sore throat. Cardiovascular: Denies chest pain. Respiratory: Denies shortness of breath. Gastrointestinal: As above Genitourinary: As above Musculoskeletal: Negative for back pain. Skin: Negative for rash. Neurological: Negative for headaches or weakness   ____________________________________________   PHYSICAL EXAM:  VITAL SIGNS: ED Triage Vitals  Enc Vitals Group     BP 10/20/19 1147 126/85     Pulse Rate 10/20/19 1147 (!) 115     Resp 10/20/19 1147 20     Temp 10/20/19 1147 100 F (37.8 C)     Temp Source 10/20/19 1147 Oral     SpO2 10/20/19 1147 97 %     Weight 10/20/19 1148 63.5 kg (140 lb)     Height 10/20/19 1148 1.753 m (5\' 9" )     Head Circumference --  Peak Flow --      Pain Score 10/20/19 1147 10     Pain Loc --      Pain Edu? --      Excl. in [place]? --     Constitutional: Alert and oriented.  Obviously uncomfortable Eyes: Conjunctivae are normal.  Head: Atraumatic. Nose: No congestion/rhinnorhea.  Cardiovascular: Normal rate, regular rhythm. Grossly normal heart sounds.  Good peripheral circulation. Respiratory: Normal respiratory effort.  No retractions. Lungs CTAB. Gastrointestinal: Mild tenderness in the lower abdomen/suprapubically no distention.  No CVA tenderness. GU: Mild CMT, significant yellowish drainage noted Musculoskeletal:   Warm and well perfused Neurologic:  Normal speech and language. No gross focal  neurologic deficits are appreciated.  Skin:  Skin is warm, dry and intact. No rash noted. Psychiatric: Mood and affect are normal. Speech and behavior are normal.  ____________________________________________   LABS (all labs ordered are listed, but only abnormal results are displayed)  Labs Reviewed  CHLAMYDIA/NGC RT PCR ([facility] ONLY) - Abnormal; Notable for the following components:      Result Value   N gonorrhoeae DETECTED (*)    All other components within normal limits  WET PREP, GENITAL - Abnormal; Notable for the following components:   Clue Cells Wet Prep HPF POC PRESENT (*)    WBC, Wet Prep HPF POC MODERATE (*)    All other components within normal limits  COMPREHENSIVE METABOLIC PANEL - Abnormal; Notable for the following components:   CO2 20 (*)    Calcium 8.8 (*)    Total Bilirubin 1.3 (*)    All other components within normal limits  CBC - Abnormal; Notable for the following components:   WBC 23.7 (*)    MCH 34.2 (*)    All other components within normal limits  URINALYSIS, COMPLETE (UACMP) WITH MICROSCOPIC - Abnormal; Notable for the following components:   Color, Urine YELLOW (*)    APPearance CLOUDY (*)    Hgb urine dipstick SMALL (*)    Ketones, ur 5 (*)    Leukocytes,Ua LARGE (*)    WBC, UA >50 (*)    All other components within normal limits  LIPASE, BLOOD  POC URINE PREG, ED  POCT PREGNANCY, URINE   ____________________________________________  EKG  None ____________________________________________  RADIOLOGY  CT abdomen pelvis Ultrasound ____________________________________________   PROCEDURES  Procedure(s) performed: No  Procedures   Critical Care performed: No ____________________________________________   INITIAL IMPRESSION / ASSESSMENT AND PLAN / ED COURSE  Pertinent labs & imaging results that were available during my care of the patient were reviewed by me and considered in my medical decision making (see chart for  details).  Patient presents with lower abdominal pain with significant discomfort.  Noted to be tachycardic with elevated temperature.  She does report that she received a Covid vaccine a couple of days ago which could explain her mildly elevated temperature however white blood cell count noted to be 24,000 as well.  Differential includes colitis, PID, UTI, kidney stone.  IV morphine, IV Zofran given.  Sent for CT abdomen pelvis  CT scan demonstrates unusual inflammatory changes and stranding of the pelvic and perisigmoid fat  Pelvic exam demonstrates significant yellowish discharge.  Given concern for PID obtained ultrasound but no evidence of TOA.  Discussed with Dr. Valentino Saxon who recommends IV antibiotics and admission although not entirely clear that the inflammatory changes are related to PID so have contacted medicine.  Patient with penicillin allergy, IV Clinda and gentamicin ordered  Medicine has agreed  to admit.   Patient has notified myself and nurse that she is unable to be admitted because she cannot arrange for childcare, she wants to leave AGAINST MEDICAL ADVICE and she understands that she can return at any time.  I have sent prescriptions for her to her pharmacy for antibiotics and pain medication.  She does have decisional capacity      ____________________________________________   FINAL CLINICAL IMPRESSION(S) / ED DIAGNOSES  Final diagnoses:  PID (acute pelvic inflammatory disease)        Note:  This document was prepared using Dragon voice recognition software and may include unintentional dictation errors.   Jene Every, MD 10/20/19 Norberta Keens

## 2019-10-20 NOTE — ED Notes (Signed)
Pt left AMA. Pt had paperwork and stated the provider told her to come back if she finds a sitter for her children. Pt left AMA and signed AMA document.  IV removed before pt left

## 2019-10-20 NOTE — ED Notes (Signed)
First Nurse Note: Pt to ED via [facility] from home for abd cramps. Pt states that she has been unable to urinate or have BM. Pt has 18 G IV in LAC.

## 2019-10-20 NOTE — ED Notes (Signed)
Pt transported to US

## 2019-10-30 ENCOUNTER — Telehealth: Payer: Self-pay | Admitting: Obstetrics and Gynecology

## 2019-10-30 NOTE — Telephone Encounter (Signed)
Called patient to inform her that since she was in the ER with abdominal pain it is recommended to see  GYN to have a follow up appointment. Patient has established care at [facility]. The purpose of my call is to inform patient she needs to set up an appointment to see her GYN at [facility] or she can set up an appointment with [place] if she would like. Left patient a VM for her to call the office.

## 2019-12-29 ENCOUNTER — Emergency Department: Payer: Medicaid Other

## 2019-12-29 ENCOUNTER — Emergency Department
Admission: EM | Admit: 2019-12-29 | Discharge: 2019-12-29 | Disposition: A | Discharge status: Home / Self Care | Payer: Medicaid Other | Attending: Emergency Medicine | Admitting: Emergency Medicine

## 2019-12-29 ENCOUNTER — Other Ambulatory Visit: Payer: Self-pay

## 2019-12-29 ENCOUNTER — Encounter: Payer: Self-pay | Admitting: Emergency Medicine

## 2019-12-29 DIAGNOSIS — T17208A Unspecified foreign body in pharynx causing other injury, initial encounter: Secondary | ICD-10-CM | POA: Insufficient documentation

## 2019-12-29 DIAGNOSIS — R05 Cough: Secondary | ICD-10-CM | POA: Diagnosis not present

## 2019-12-29 DIAGNOSIS — Y999 Unspecified external cause status: Secondary | ICD-10-CM | POA: Diagnosis not present

## 2019-12-29 DIAGNOSIS — Y939 Activity, unspecified: Secondary | ICD-10-CM | POA: Insufficient documentation

## 2019-12-29 DIAGNOSIS — Z5321 Procedure and treatment not carried out due to patient leaving prior to being seen by health care provider: Secondary | ICD-10-CM | POA: Insufficient documentation

## 2019-12-29 DIAGNOSIS — Y929 Unspecified place or not applicable: Secondary | ICD-10-CM | POA: Diagnosis not present

## 2019-12-29 DIAGNOSIS — X58XXXA Exposure to other specified factors, initial encounter: Secondary | ICD-10-CM | POA: Insufficient documentation

## 2019-12-29 NOTE — ED Triage Notes (Addendum)
Pt presents to ED via [facility] with c/o feeling like something is stuck in her throat. Per EMS pt had 1 episode of vomiting last night, hx of trach removal in 2012, feels like she has something stuck around the trach site. Per EMS lung sounds clear, pain with swallowing. Pt with congested cough noted in triage.  Pt with noted hoarse voice in triage, able to maintain her own secretions in triage.   98% RA

## 2019-12-29 NOTE — ED Notes (Signed)
Pt visualized by Lequita Halt, EDT leaving. Pt not visualized in ED by this RN at this time.

## 2020-01-08 ENCOUNTER — Telehealth: Payer: Self-pay | Admitting: Obstetrics and Gynecology

## 2020-01-08 NOTE — Telephone Encounter (Signed)
Patient is scheduled for 01/20/20 for Mirena placement

## 2020-01-17 NOTE — Telephone Encounter (Signed)
Noted. Mirena reserved for this patient. 

## 2020-01-20 ENCOUNTER — Ambulatory Visit: Payer: Medicaid Other | Admitting: Obstetrics and Gynecology

## 2020-05-01 ENCOUNTER — Emergency Department: Payer: Medicaid Other

## 2020-05-01 ENCOUNTER — Other Ambulatory Visit: Payer: Self-pay

## 2020-05-01 ENCOUNTER — Emergency Department
Admission: EM | Admit: 2020-05-01 | Discharge: 2020-05-01 | Disposition: A | Discharge status: Home / Self Care | Payer: Medicaid Other | Attending: Emergency Medicine | Admitting: Emergency Medicine

## 2020-05-01 DIAGNOSIS — J45909 Unspecified asthma, uncomplicated: Secondary | ICD-10-CM | POA: Insufficient documentation

## 2020-05-01 DIAGNOSIS — F17210 Nicotine dependence, cigarettes, uncomplicated: Secondary | ICD-10-CM | POA: Diagnosis not present

## 2020-05-01 DIAGNOSIS — R059 Cough, unspecified: Secondary | ICD-10-CM | POA: Diagnosis present

## 2020-05-01 DIAGNOSIS — U071 COVID-19: Secondary | ICD-10-CM | POA: Insufficient documentation

## 2020-05-01 DIAGNOSIS — J449 Chronic obstructive pulmonary disease, unspecified: Secondary | ICD-10-CM | POA: Insufficient documentation

## 2020-05-01 DIAGNOSIS — I10 Essential (primary) hypertension: Secondary | ICD-10-CM | POA: Insufficient documentation

## 2020-05-01 LAB — CBC
HCT: 41.9 % (ref 36.0–46.0)
Hemoglobin: 14.4 g/dL (ref 12.0–15.0)
MCH: 34.2 pg — ABNORMAL HIGH (ref 26.0–34.0)
MCHC: 34.4 g/dL (ref 30.0–36.0)
MCV: 99.5 fL (ref 80.0–100.0)
Platelets: 210 10*3/uL (ref 150–400)
RBC: 4.21 MIL/uL (ref 3.87–5.11)
RDW: 11.2 % — ABNORMAL LOW (ref 11.5–15.5)
WBC: 8.2 10*3/uL (ref 4.0–10.5)
nRBC: 0 % (ref 0.0–0.2)

## 2020-05-01 LAB — BASIC METABOLIC PANEL
Anion gap: 8 (ref 5–15)
BUN: 13 mg/dL (ref 6–20)
CO2: 25 mmol/L (ref 22–32)
Calcium: 9.2 mg/dL (ref 8.9–10.3)
Chloride: 106 mmol/L (ref 98–111)
Creatinine, Ser: 0.7 mg/dL (ref 0.44–1.00)
GFR, Estimated: >60 mL/min (ref >60)
Glucose, Bld: 88 mg/dL (ref 70–99)
Potassium: 4.6 mmol/L (ref 3.5–5.1)
Sodium: 139 mmol/L (ref 135–145)

## 2020-05-01 MED ORDER — PSEUDOEPH-BROMPHEN-DM 30-2-10 MG/5ML PO SYRP: 10.0000 mL | ORAL_SOLUTION | Freq: Four times a day (QID) | ORAL | 0 refills | Status: DC | PRN

## 2020-05-01 MED ORDER — PREDNISONE 50 MG PO TABS: 50.0000 mg | ORAL_TABLET | Freq: Every day | ORAL | 0 refills | Status: DC

## 2020-05-01 MED ORDER — ALBUTEROL SULFATE HFA 108 (90 BASE) MCG/ACT IN AERS: 2.0000 | INHALATION_SPRAY | RESPIRATORY_TRACT | 0 refills | Status: DC | PRN

## 2020-05-01 MED ORDER — MAGIC MOUTHWASH W/LIDOCAINE: 5.0000 mL | Freq: Four times a day (QID) | ORAL | 0 refills | Status: DC

## 2020-05-01 NOTE — ED Provider Notes (Signed)
[facility] Emergency Department Provider Note  ____________________________________________  Time seen: Approximately 4:18 PM  I have reviewed the triage vital signs and the nursing notes.   HISTORY  Chief Complaint Generalized Body Aches, Cough, and Headache    HPI Jacqueline Cooke is a 39 y.o. female who presents the emergency department for evaluation of possible Covid.  Patient developed URI symptoms of headache, fevers and chills, nasal congestion, sore throat and cough starting last night.  She took an at home test last night which was negative repeated today twice which was positive both times.  Patient states that she is concerned that she does have Covid though she has no specific known contacts.  Patient states that she is also concerned as she had an episode of pneumonia that led to her having a trach and being placed on a ventilator.  She states that any respiratory illness scares her.  She has a history of anemia, asthma, COPD, depression, hypertension, tachycardia.  She denies any chest pain, difficulty breathing currently.  No GI complaints.         Past Medical History:  Diagnosis Date  . Anemia   . Asthma   . COPD (chronic obstructive pulmonary disease) (HCC)   . Depression   . Hyperphosphatemia   . Hypertension   . Leukocytosis   . Substance abuse (HCC)    History of Cocaine abuse  . Tachycardia     Patient Active Problem List   Diagnosis Date Noted  . Tachycardia 12/09/2010  . SOB (shortness of breath) 12/09/2010    Past Surgical History:  Procedure Laterality Date  . CESAREAN SECTION  01/04/2013  . DILATION AND CURETTAGE OF UTERUS  2012   [facility]  2013 [facility]  . TRACHEOSTOMY  10/2010    Prior to Admission medications   Medication Sig Start Date End Date Taking? Authorizing Provider  albuterol (VENTOLIN HFA) 108 (90 Base) MCG/ACT inhaler Inhale 2 puffs into the lungs every 4 (four) hours as needed for wheezing  or shortness of breath. 05/01/20  Yes Marcena Dias, Delorise Royals, PA-C  brompheniramine-pseudoephedrine-DM 30-2-10 MG/5ML syrup Take 10 mLs by mouth 4 (four) times daily as needed. 05/01/20  Yes Jontae Adebayo, Delorise Royals, PA-C  magic mouthwash w/lidocaine SOLN Take 5 mLs by mouth 4 (four) times daily. Gargle and spit 05/01/20  Yes Mercer Peifer, Delorise Royals, PA-C  predniSONE (DELTASONE) 50 MG tablet Take 1 tablet (50 mg total) by mouth daily with breakfast. 05/01/20  Yes Aslan Montagna, Delorise Royals, PA-C  albuterol (PROVENTIL,VENTOLIN) 90 MCG/ACT inhaler Inhale 2 puffs into the lungs every 6 (six) hours as needed for wheezing. 12/09/10 12/04/11  Antonieta Iba, MD  doxycycline (VIBRA-TABS) 100 MG tablet Take 1 tablet (100 mg total) by mouth 2 (two) times daily. 10/20/19   Jene Every, MD  metroNIDAZOLE (FLAGYL) 500 MG tablet Take 1 tablet (500 mg total) by mouth 2 (two) times daily after a meal. 10/20/19   Jene Every, MD  traMADol (ULTRAM) 50 MG tablet Take 1 tablet (50 mg total) by mouth every 6 (six) hours as needed. 10/20/19 10/19/20  Jene Every, MD    Allergies Penicillins cross reactors  No family history on file.  Social History Social History   Tobacco Use  . Smoking status: Current Every Day Smoker    Packs/day: 0.10    Types: Cigarettes  . Smokeless tobacco: Never Used  Substance Use Topics  . Alcohol use: Yes    Comment: social  . Drug use: Yes  Types: "Crack" cocaine, Marijuana    Comment: History of Marijuana, Cocaine some days     Review of Systems  Constitutional: Positive fever/chills.  Positive for body aches Eyes: No visual changes. No discharge ENT: Positive for nasal congestion and sore throat Cardiovascular: no chest pain. Respiratory: Positive cough. No SOB. Gastrointestinal: No abdominal pain.  No nausea, no vomiting.  No diarrhea.  No constipation. Musculoskeletal: Negative for musculoskeletal pain. Skin: Negative for rash, abrasions, lacerations,  ecchymosis. Neurological: Negative for headaches, focal weakness or numbness.  10 System ROS otherwise negative.  ____________________________________________   PHYSICAL EXAM:  VITAL SIGNS: ED Triage Vitals  Enc Vitals Group     BP 05/01/20 1512 109/76     Pulse Rate 05/01/20 1512 80     Resp 05/01/20 1512 17     Temp 05/01/20 1512 98.3 F (36.8 C)     Temp Source 05/01/20 1512 Oral     SpO2 05/01/20 1512 99 %     Weight 05/01/20 1517 150 lb (68 kg)     Height 05/01/20 1517 5\' 8"  (1.727 m)     Head Circumference --      Peak Flow --      Pain Score 05/01/20 1517 10     Pain Loc --      Pain Edu? --      Excl. in GC? --      Constitutional: Alert and oriented. Well appearing and in no acute distress. Eyes: Conjunctivae are normal. PERRL. EOMI. Head: Atraumatic. ENT:      Ears:       Nose: No congestion/rhinnorhea.      Mouth/Throat: Mucous membranes are moist.  No gross erythema or edema identified uvula is midline neck: No stridor.  Scar from previous tracheotomy is identified.  Full range of motion to the cervical spine currently Hematological/Lymphatic/Immunilogical: No cervical lymphadenopathy. Cardiovascular: Normal rate, regular rhythm. Normal S1 and S2.  Good peripheral circulation. Respiratory: Normal respiratory effort without tachypnea or retractions. Lungs CTAB. Good air entry to the bases with no decreased or absent breath sounds. Musculoskeletal: Full range of motion to all extremities. No gross deformities appreciated. Neurologic:  Normal speech and language. No gross focal neurologic deficits are appreciated.  Skin:  Skin is warm, dry and intact. No rash noted. Psychiatric: Mood and affect are normal. Speech and behavior are normal. Patient exhibits appropriate insight and judgement.   ____________________________________________   LABS (all labs ordered are listed, but only abnormal results are displayed)  Labs Reviewed  CBC - Abnormal; Notable for  the following components:      Result Value   MCH 34.2 (*)    RDW 11.2 (*)    All other components within normal limits  BASIC METABOLIC PANEL   ____________________________________________  EKG   ____________________________________________  RADIOLOGY I personally viewed and evaluated these images as part of my medical decision making, as well as reviewing the written report by the radiologist.  ED Provider Interpretation: Findings consistent with viral pneumonia  DG Chest 2 View  Result Date: 05/01/2020 CLINICAL DATA:  Dyspnea. Chest pain and headache. Positive home COVID test. EXAM: CHEST - 2 VIEW COMPARISON:  Radiograph 12/29/2019 FINDINGS: Vague opacities in the lung bases. Heart is normal in size with normal mediastinal contours. No pneumomediastinum or pneumothorax. No pleural fluid. No pulmonary edema. No acute osseous abnormalities are seen. IMPRESSION: Vague opacities in the lung bases, may be atelectasis or atypical viral pneumonia. Electronically Signed   By: 12/31/2019.D.  On: 05/01/2020 15:57    ____________________________________________    PROCEDURES  Procedure(s) performed:    Procedures    Medications - No data to display   ____________________________________________   INITIAL IMPRESSION / ASSESSMENT AND PLAN / ED COURSE  Pertinent labs & imaging results that were available during my care of the patient were reviewed by me and considered in my medical decision making (see chart for details).  Review of the [place] CSRS was performed in accordance of the [facility] prior to dispensing any controlled drugs.           Patient's diagnosis is consistent with COVID-19.  Patient presented to emergency department with COVID-19-like symptoms.  Patient just completed her second vaccine a week ago.  Based off of physical exam, symptoms and 2 at home positive COVID-19 test patient is diagnosed with COVID-19.  No distress currently, vital signs are stable  and labs are stable.  Patient is given return precautions as she does have a history of trach placement secondary to a bacterial pneumonia in the past.  Given this patient has strict return precautions for any worsening respiratory symptoms.  Patient will have prescription for prednisone, albuterol, Bromfed cough syrup, Magic mouthwash for symptom relief.  Follow-up primary care.. Patient is given ED precautions to return to the ED for any worsening or new symptoms.     ____________________________________________  FINAL CLINICAL IMPRESSION(S) / ED DIAGNOSES  Final diagnoses:  COVID-19      NEW MEDICATIONS STARTED DURING THIS VISIT:  ED Discharge Orders         Ordered    predniSONE (DELTASONE) 50 MG tablet  Daily with breakfast        05/01/20 1636    brompheniramine-pseudoephedrine-DM 30-2-10 MG/5ML syrup  4 times daily PRN        05/01/20 1636    magic mouthwash w/lidocaine SOLN  4 times daily       Note to Pharmacy: Dispense in a 1/1/1 ratio. Use lidocaine, diphenhydramine, prednisolone   05/01/20 1636    albuterol (VENTOLIN HFA) 108 (90 Base) MCG/ACT inhaler  Every 4 hours PRN        05/01/20 1636              This chart was dictated using voice recognition software/Dragon. Despite best efforts to proofread, errors can occur which can change the meaning. Any change was purely unintentional.    Racheal Patches, PA-C 05/01/20 1638    Sharman Cheek, MD 05/01/20 2048

## 2020-05-01 NOTE — ED Triage Notes (Addendum)
Pt here from home via POV with complaints of flu-like symptoms.   States she started feeling badly last night so she took a home covid test and it was negative, she took a repeat test this morning and it was positive. Pt reports generalized body aches and a productive cough, congestion. Sputum is clear and small amount. Reports SHOB w/ exertion and rest. States her throat hurts and it is becoming painful to swallow, no difficulty managing secretions. Pt in NAD at this time.   Previous hx of trachestomy after pneumonia hospitalization.

## 2020-05-01 NOTE — ED Notes (Signed)
Pt signed printed d/c paperwork.  

## 2020-05-01 NOTE — ED Triage Notes (Signed)
FIRST NURSE NOTE: pt here with reports of 2 positive home covid tests, wants another test to confirm she is positive. C/o HA and cough.

## 2020-05-01 NOTE — ED Notes (Signed)
See triage note. Pt resting calmly in bed; denies recent fever; skin dry; resp reg/unlabored. Phone given to pt as requested.

## 2020-05-02 ENCOUNTER — Encounter: Payer: Self-pay | Admitting: Emergency Medicine

## 2020-05-02 ENCOUNTER — Emergency Department: Payer: Medicaid Other

## 2020-05-02 ENCOUNTER — Other Ambulatory Visit: Payer: Self-pay

## 2020-05-02 DIAGNOSIS — F1590 Other stimulant use, unspecified, uncomplicated: Secondary | ICD-10-CM | POA: Diagnosis not present

## 2020-05-02 DIAGNOSIS — R0602 Shortness of breath: Secondary | ICD-10-CM | POA: Diagnosis present

## 2020-05-02 DIAGNOSIS — Z5321 Procedure and treatment not carried out due to patient leaving prior to being seen by health care provider: Secondary | ICD-10-CM | POA: Insufficient documentation

## 2020-05-02 DIAGNOSIS — I10 Essential (primary) hypertension: Secondary | ICD-10-CM | POA: Insufficient documentation

## 2020-05-02 DIAGNOSIS — U071 COVID-19: Secondary | ICD-10-CM | POA: Insufficient documentation

## 2020-05-02 DIAGNOSIS — J45909 Unspecified asthma, uncomplicated: Secondary | ICD-10-CM | POA: Insufficient documentation

## 2020-05-02 DIAGNOSIS — F17210 Nicotine dependence, cigarettes, uncomplicated: Secondary | ICD-10-CM | POA: Insufficient documentation

## 2020-05-02 DIAGNOSIS — J449 Chronic obstructive pulmonary disease, unspecified: Secondary | ICD-10-CM | POA: Insufficient documentation

## 2020-05-02 LAB — BASIC METABOLIC PANEL
Anion gap: 9 (ref 5–15)
BUN: 10 mg/dL (ref 6–20)
CO2: 22 mmol/L (ref 22–32)
Calcium: 9.1 mg/dL (ref 8.9–10.3)
Chloride: 105 mmol/L (ref 98–111)
Creatinine, Ser: 0.64 mg/dL (ref 0.44–1.00)
GFR, Estimated: >60 mL/min (ref >60)
Glucose, Bld: 115 mg/dL — ABNORMAL HIGH (ref 70–99)
Potassium: 3.9 mmol/L (ref 3.5–5.1)
Sodium: 136 mmol/L (ref 135–145)

## 2020-05-02 LAB — CBC
HCT: 37.9 % (ref 36.0–46.0)
Hemoglobin: 13.2 g/dL (ref 12.0–15.0)
MCH: 34.2 pg — ABNORMAL HIGH (ref 26.0–34.0)
MCHC: 34.8 g/dL (ref 30.0–36.0)
MCV: 98.2 fL (ref 80.0–100.0)
Platelets: 197 10*3/uL (ref 150–400)
RBC: 3.86 MIL/uL — ABNORMAL LOW (ref 3.87–5.11)
RDW: 11.3 % — ABNORMAL LOW (ref 11.5–15.5)
WBC: 7.9 10*3/uL (ref 4.0–10.5)
nRBC: 0 % (ref 0.0–0.2)

## 2020-05-02 LAB — TROPONIN I (HIGH SENSITIVITY): Troponin I (High Sensitivity): <2 ng/L (ref <18)

## 2020-05-02 NOTE — ED Triage Notes (Signed)
Pt seen in ED on 12/31, discharged with COVID and pnumonia. Pt back to ED tonight with increased SOB when lying down. Pt has hx/o COPD, Ventilator support with previous trach (no trach current.) Pt took 2 at home test with positive result.

## 2020-05-03 ENCOUNTER — Emergency Department
Admission: EM | Admit: 2020-05-03 | Discharge: 2020-05-03 | Disposition: A | Discharge status: Home / Self Care | Payer: Medicaid Other | Attending: Emergency Medicine | Admitting: Emergency Medicine

## 2020-05-03 ENCOUNTER — Other Ambulatory Visit: Payer: Self-pay

## 2020-05-03 ENCOUNTER — Encounter: Payer: Self-pay | Admitting: Emergency Medicine

## 2020-05-03 ENCOUNTER — Emergency Department
Admission: EM | Admit: 2020-05-03 | Discharge: 2020-05-03 | Disposition: A | Discharge status: Home / Self Care | Payer: Medicaid Other | Source: Home / Self Care | Attending: Student in an Organized Health Care Education/Training Program | Admitting: Student in an Organized Health Care Education/Training Program

## 2020-05-03 DIAGNOSIS — I10 Essential (primary) hypertension: Secondary | ICD-10-CM | POA: Insufficient documentation

## 2020-05-03 DIAGNOSIS — J449 Chronic obstructive pulmonary disease, unspecified: Secondary | ICD-10-CM | POA: Insufficient documentation

## 2020-05-03 DIAGNOSIS — J45909 Unspecified asthma, uncomplicated: Secondary | ICD-10-CM | POA: Insufficient documentation

## 2020-05-03 DIAGNOSIS — F17210 Nicotine dependence, cigarettes, uncomplicated: Secondary | ICD-10-CM | POA: Insufficient documentation

## 2020-05-03 DIAGNOSIS — U071 COVID-19: Secondary | ICD-10-CM

## 2020-05-03 DIAGNOSIS — F1590 Other stimulant use, unspecified, uncomplicated: Secondary | ICD-10-CM | POA: Insufficient documentation

## 2020-05-03 MED ORDER — BENZONATATE 100 MG PO CAPS: 100.0000 mg | ORAL_CAPSULE | Freq: Four times a day (QID) | ORAL | 1 refills | Status: DC | PRN

## 2020-05-03 MED ORDER — ALBUTEROL SULFATE HFA 108 (90 BASE) MCG/ACT IN AERS: 2.0000 | INHALATION_SPRAY | RESPIRATORY_TRACT | 1 refills | Status: DC | PRN

## 2020-05-03 MED ORDER — TRAMADOL HCL 50 MG PO TABS
50.0000 mg | ORAL_TABLET | Freq: Once | ORAL | Status: AC
  Administered 2020-05-03: 50 mg via ORAL
  Filled 2020-05-03: qty 1

## 2020-05-03 NOTE — ED Provider Notes (Signed)
[facility] Emergency Department Provider Note    Event Date/Time   First MD Initiated Contact with Patient 05/03/20 1130     (approximate)  I have reviewed the triage vital signs and the nursing notes.   HISTORY  Chief Complaint Chest Pain and Covid Positive    HPI Jacqueline Cooke is a 40 y.o. female below listed past medical history recent positive test for Covid presents to the ER for persistent cough congestion headache and chest pain.  Feels like he is not getting any improvement with the medications at home.  Would like refill on her home nebulizer and would like to try something else for the cough.  Denies any other complaints.    Past Medical History:  Diagnosis Date  . Anemia   . Asthma   . COPD (chronic obstructive pulmonary disease) (HCC)   . Depression   . Hyperphosphatemia   . Hypertension   . Leukocytosis   . Substance abuse (HCC)    History of Cocaine abuse  . Tachycardia    No family history on file. Past Surgical History:  Procedure Laterality Date  . CESAREAN SECTION  01/04/2013  . DILATION AND CURETTAGE OF UTERUS  2012   [facility]  2013 [facility]  . TRACHEOSTOMY  10/2010   Patient Active Problem List   Diagnosis Date Noted  . Tachycardia 12/09/2010  . SOB (shortness of breath) 12/09/2010      Prior to Admission medications   Medication Sig Start Date End Date Taking? Authorizing Provider  benzonatate (TESSALON PERLES) 100 MG capsule Take 1 capsule (100 mg total) by mouth every 6 (six) hours as needed for cough. 05/03/20 05/03/21 Yes Willy Eddy, MD  albuterol (PROVENTIL,VENTOLIN) 90 MCG/ACT inhaler Inhale 2 puffs into the lungs every 6 (six) hours as needed for wheezing. 12/09/10 12/04/11  Antonieta Iba, MD  albuterol (VENTOLIN HFA) 108 (90 Base) MCG/ACT inhaler Inhale 2 puffs into the lungs every 4 (four) hours as needed for wheezing or shortness of breath. 05/03/20   Willy Eddy, MD   brompheniramine-pseudoephedrine-DM 30-2-10 MG/5ML syrup Take 10 mLs by mouth 4 (four) times daily as needed. 05/01/20   Cuthriell, Delorise Royals, PA-C  doxycycline (VIBRA-TABS) 100 MG tablet Take 1 tablet (100 mg total) by mouth 2 (two) times daily. 10/20/19   Jene Every, MD  magic mouthwash w/lidocaine SOLN Take 5 mLs by mouth 4 (four) times daily. Gargle and spit 05/01/20   Cuthriell, Delorise Royals, PA-C  metroNIDAZOLE (FLAGYL) 500 MG tablet Take 1 tablet (500 mg total) by mouth 2 (two) times daily after a meal. 10/20/19   Jene Every, MD  predniSONE (DELTASONE) 50 MG tablet Take 1 tablet (50 mg total) by mouth daily with breakfast. 05/01/20   Cuthriell, Delorise Royals, PA-C  traMADol (ULTRAM) 50 MG tablet Take 1 tablet (50 mg total) by mouth every 6 (six) hours as needed. 10/20/19 10/19/20  Jene Every, MD    Allergies Penicillins cross reactors    Social History Social History   Tobacco Use  . Smoking status: Current Every Day Smoker    Packs/day: 0.10    Types: Cigarettes  . Smokeless tobacco: Never Used  Substance Use Topics  . Alcohol use: Yes    Comment: social  . Drug use: Yes    Types: "Crack" cocaine, Marijuana    Comment: History of Marijuana, Cocaine some days    Review of Systems Patient denies headaches, rhinorrhea, blurry vision, numbness, shortness of breath, chest pain,  edema, cough, abdominal pain, nausea, vomiting, diarrhea, dysuria, fevers, rashes or hallucinations unless otherwise stated above in HPI. ____________________________________________   PHYSICAL EXAM:  VITAL SIGNS: Vitals:   05/03/20 0734 05/03/20 1209  BP: 106/70 109/69  Pulse: 84 77  Resp: 16 15  Temp: 98.7 F (37.1 C)   SpO2: 100% 100%    Constitutional: Alert and oriented.  Eyes: Conjunctivae are normal.  Head: Atraumatic. Nose: No congestion/rhinnorhea. Mouth/Throat: Mucous membranes are moist.   Neck: No stridor. Painless ROM.  Cardiovascular: Normal rate, regular rhythm.  Grossly normal heart sounds.  Good peripheral circulation. Respiratory: Normal respiratory effort.  No retractions. Lungs CTAB. Gastrointestinal: Soft and nontender. No distention. No abdominal bruits. No CVA tenderness. Genitourinary:  Musculoskeletal: No lower extremity tenderness nor edema.  No joint effusions. Neurologic:  Normal speech and language. No gross focal neurologic deficits are appreciated. No facial droop Skin:  Skin is warm, dry and intact. No rash noted. Psychiatric: Mood and affect are normal. Speech and behavior are normal.  ____________________________________________   LABS (all labs ordered are listed, but only abnormal results are displayed)  Results for orders placed or performed during the hospital encounter of 05/03/20 (from the past 24 hour(s))  Basic metabolic panel     Status: Abnormal   Collection Time: 05/02/20 10:44 PM  Result Value Ref Range   Sodium 136 135 - 145 mmol/L   Potassium 3.9 3.5 - 5.1 mmol/L   Chloride 105 98 - 111 mmol/L   CO2 22 22 - 32 mmol/L   Glucose, Bld 115 (H) 70 - 99 mg/dL   BUN 10 6 - 20 mg/dL   Creatinine, Ser 5.36 0.44 - 1.00 mg/dL   Calcium 9.1 8.9 - 64.4 mg/dL   GFR, Estimated >03 >47 mL/min   Anion gap 9 5 - 15  CBC     Status: Abnormal   Collection Time: 05/02/20 10:44 PM  Result Value Ref Range   WBC 7.9 4.0 - 10.5 K/uL   RBC 3.86 (L) 3.87 - 5.11 MIL/uL   Hemoglobin 13.2 12.0 - 15.0 g/dL   HCT 42.5 95.6 - 38.7 %   MCV 98.2 80.0 - 100.0 fL   MCH 34.2 (H) 26.0 - 34.0 pg   MCHC 34.8 30.0 - 36.0 g/dL   RDW 56.4 (L) 33.2 - 95.1 %   Platelets 197 150 - 400 K/uL   nRBC 0.0 0.0 - 0.2 %  Troponin I (High Sensitivity)     Status: None   Collection Time: 05/02/20 10:44 PM  Result Value Ref Range   Troponin I (High Sensitivity) <2 <18 ng/L   ____________________________________________  EKG My review and personal interpretation at Time: 7:43   Indication: sob  Rate: 80  Rhythm: sinus Axis: normal Other: normal  intervals, no stemi ____________________________________________  RADIOLOGY  ____________________________________________   PROCEDURES  Procedure(s) performed:  Procedures    Critical Care performed: no ____________________________________________   INITIAL IMPRESSION / ASSESSMENT AND PLAN / ED COURSE  Pertinent labs & imaging results that were available during my care of the patient were reviewed by me and considered in my medical decision making (see chart for details).   DDX: Covid 19, bronchitis, URI  Jacqueline Cooke is a 40 y.o. who presents to the ED with COVID-19 infection.  Just had imaging yesterday which was reassuring.  Symptoms are consistent with known COVID-19 illness.  Her symptoms are mild at this time.  Will give additional symptomatic management.  Do not see indication for hospitalization or  further diagnostic testing at this time.     The patient was evaluated in Emergency Department today for the symptoms described in the history of present illness. He/she was evaluated in the context of the global COVID-19 pandemic, which necessitated consideration that the patient might be at risk for infection with the SARS-CoV-2 virus that causes COVID-19. Institutional protocols and algorithms that pertain to the evaluation of patients at risk for COVID-19 are in a state of rapid change based on information released by regulatory bodies including the CDC and federal and state organizations. These policies and algorithms were followed during the patient's care in the ED.  As part of my medical decision making, I reviewed the following data within the [facility] notes reviewed and incorporated, Labs reviewed, notes from prior ED visits and [place] Controlled Substance Database   ____________________________________________   FINAL CLINICAL IMPRESSION(S) / ED DIAGNOSES  Final diagnoses:  COVID-19 virus infection      NEW MEDICATIONS STARTED  DURING THIS VISIT:  Discharge Medication List as of 05/03/2020 11:59 AM    START taking these medications   Details  benzonatate (TESSALON PERLES) 100 MG capsule Take 1 capsule (100 mg total) by mouth every 6 (six) hours as needed for cough., Starting Sun 05/03/2020, Until Mon 05/03/2021 at 2359, Normal         Note:  This document was prepared using Dragon voice recognition software and may include unintentional dictation errors.    Merlyn Lot, MD 05/03/20 1535

## 2020-05-03 NOTE — ED Triage Notes (Signed)
Pt to ED via POV, pt states that she took 4 home COIVD test and they came back positive. Pt came to the ED on Friday and had a CXR which she says showed she had COVID. Pt states that since Thursday she has been having chest pain. Pt states that she has had to be intubated in the past due to pneumonia. Pt is in NAD.

## 2020-05-03 NOTE — ED Notes (Signed)
Pt called repeated times for vital sign check and 3x for  repeat trop.  Wheelchair pt was in is empty.  No answer outside

## 2020-06-12 ENCOUNTER — Telehealth: Payer: Self-pay | Admitting: Obstetrics and Gynecology

## 2020-06-12 NOTE — Telephone Encounter (Signed)
Patient coming in for Mirena insertion 07/01/2020 at 10:50 with AMS

## 2020-06-15 NOTE — Telephone Encounter (Signed)
Noted. Will order to arrive by apt date/time. 

## 2020-06-19 ENCOUNTER — Other Ambulatory Visit: Payer: Self-pay

## 2020-06-19 ENCOUNTER — Ambulatory Visit (LOCAL_COMMUNITY_HEALTH_CENTER): Payer: Self-pay

## 2020-06-19 DIAGNOSIS — Z111 Encounter for screening for respiratory tuberculosis: Secondary | ICD-10-CM

## 2020-06-22 ENCOUNTER — Other Ambulatory Visit: Payer: Self-pay

## 2020-06-22 ENCOUNTER — Ambulatory Visit (LOCAL_COMMUNITY_HEALTH_CENTER): Payer: Medicaid Other

## 2020-06-22 DIAGNOSIS — Z111 Encounter for screening for respiratory tuberculosis: Secondary | ICD-10-CM

## 2020-06-22 LAB — TB SKIN TEST
Induration: 0 mm
TB Skin Test: NEGATIVE

## 2020-07-01 ENCOUNTER — Ambulatory Visit: Payer: Medicaid Other | Admitting: Obstetrics and Gynecology

## 2020-07-03 ENCOUNTER — Encounter: Payer: Self-pay | Admitting: Obstetrics and Gynecology

## 2020-07-03 ENCOUNTER — Ambulatory Visit (INDEPENDENT_AMBULATORY_CARE_PROVIDER_SITE_OTHER): Payer: Medicaid Other | Admitting: Obstetrics and Gynecology

## 2020-07-03 ENCOUNTER — Other Ambulatory Visit: Payer: Self-pay

## 2020-07-03 VITALS — BP 118/72 | Ht 66.0 in | Wt 148.0 lb

## 2020-07-03 DIAGNOSIS — Z3202 Encounter for pregnancy test, result negative: Secondary | ICD-10-CM

## 2020-07-03 DIAGNOSIS — Z30430 Encounter for insertion of intrauterine contraceptive device: Secondary | ICD-10-CM | POA: Diagnosis not present

## 2020-07-03 DIAGNOSIS — Z3040 Encounter for surveillance of contraceptives, unspecified: Secondary | ICD-10-CM | POA: Diagnosis not present

## 2020-07-03 LAB — POCT URINE PREGNANCY: Preg Test, Ur: NEGATIVE

## 2020-07-03 NOTE — Progress Notes (Signed)
   GYNECOLOGY OFFICE PROCEDURE NOTE  Jacqueline Cooke is a 40 y.o. [id number] here for a Mirena IUD insertion. No GYN concerns.   No LMP recorded.  UPT negative.  The indication for her IUD is contraception.  IUD Insertion Procedure Note Patient identified, informed consent performed, consent signed.   Discussed risks of irregular bleeding, cramping, infection, malpositioning, expulsion or uterine perforation of the IUD (1:1000 placements)  which may require further procedure such as laparoscopy.  IUD while effective at preventing pregnancy do not prevent transmission of sexually transmitted diseases and use of barrier methods for this purpose was discussed. Time out was performed.  Urine pregnancy test negative.  Speculum placed in the vagina.  Cervix visualized.  Cleaned with Betadine x 2.  Grasped anteriorly with a single tooth tenaculum.  Uterus sounded to 7cm. IUD placed per manufacturer's recommendations.  Strings trimmed to 3 cm. Tenaculum was removed, good hemostasis noted.  Patient tolerated procedure well.   Patient was given post-procedure instructions.  She was advised to have backup contraception for one week.  Patient was also asked to check IUD strings periodically and follow up in 6 weeks for IUD check.  Vena Austria, MD, [facility], [facility]

## 2020-07-07 ENCOUNTER — Emergency Department: Payer: Medicaid Other

## 2020-07-07 ENCOUNTER — Other Ambulatory Visit: Payer: Self-pay

## 2020-07-07 ENCOUNTER — Emergency Department
Admission: EM | Admit: 2020-07-07 | Discharge: 2020-07-07 | Disposition: A | Discharge status: Home / Self Care | Payer: Medicaid Other | Attending: Emergency Medicine | Admitting: Emergency Medicine

## 2020-07-07 DIAGNOSIS — J45909 Unspecified asthma, uncomplicated: Secondary | ICD-10-CM | POA: Diagnosis not present

## 2020-07-07 DIAGNOSIS — I10 Essential (primary) hypertension: Secondary | ICD-10-CM | POA: Diagnosis not present

## 2020-07-07 DIAGNOSIS — J449 Chronic obstructive pulmonary disease, unspecified: Secondary | ICD-10-CM | POA: Diagnosis not present

## 2020-07-07 DIAGNOSIS — J040 Acute laryngitis: Secondary | ICD-10-CM

## 2020-07-07 DIAGNOSIS — R221 Localized swelling, mass and lump, neck: Secondary | ICD-10-CM | POA: Diagnosis present

## 2020-07-07 DIAGNOSIS — J029 Acute pharyngitis, unspecified: Secondary | ICD-10-CM | POA: Diagnosis not present

## 2020-07-07 DIAGNOSIS — F17210 Nicotine dependence, cigarettes, uncomplicated: Secondary | ICD-10-CM | POA: Insufficient documentation

## 2020-07-07 LAB — CBC WITH DIFFERENTIAL/PLATELET
Abs Immature Granulocytes: 0.01 10*3/uL (ref 0.00–0.07)
Basophils Absolute: 0 10*3/uL (ref 0.0–0.1)
Basophils Relative: 1 %
Eosinophils Absolute: 0.1 10*3/uL (ref 0.0–0.5)
Eosinophils Relative: 1 %
HCT: 40.1 % (ref 36.0–46.0)
Hemoglobin: 14.3 g/dL (ref 12.0–15.0)
Immature Granulocytes: 0 %
Lymphocytes Relative: 27 %
Lymphs Abs: 2 10*3/uL (ref 0.7–4.0)
MCH: 33.4 pg (ref 26.0–34.0)
MCHC: 35.7 g/dL (ref 30.0–36.0)
MCV: 93.7 fL (ref 80.0–100.0)
Monocytes Absolute: 0.6 10*3/uL (ref 0.1–1.0)
Monocytes Relative: 8 %
Neutro Abs: 4.7 10*3/uL (ref 1.7–7.7)
Neutrophils Relative %: 63 %
Platelets: 229 10*3/uL (ref 150–400)
RBC: 4.28 MIL/uL (ref 3.87–5.11)
RDW: 11.5 % (ref 11.5–15.5)
WBC: 7.5 10*3/uL (ref 4.0–10.5)
nRBC: 0 % (ref 0.0–0.2)

## 2020-07-07 LAB — BASIC METABOLIC PANEL
Anion gap: 7 (ref 5–15)
BUN: 11 mg/dL (ref 6–20)
CO2: 25 mmol/L (ref 22–32)
Calcium: 8.8 mg/dL — ABNORMAL LOW (ref 8.9–10.3)
Chloride: 106 mmol/L (ref 98–111)
Creatinine, Ser: 0.67 mg/dL (ref 0.44–1.00)
GFR, Estimated: >60 mL/min (ref >60)
Glucose, Bld: 83 mg/dL (ref 70–99)
Potassium: 3.4 mmol/L — ABNORMAL LOW (ref 3.5–5.1)
Sodium: 138 mmol/L (ref 135–145)

## 2020-07-07 LAB — GROUP A STREP BY PCR: Group A Strep by PCR: NOT DETECTED

## 2020-07-07 MED ORDER — METHYLPREDNISOLONE 4 MG PO TBPK: ORAL_TABLET | ORAL | 0 refills | Status: DC

## 2020-07-07 MED ORDER — LIDOCAINE VISCOUS HCL 2 % MT SOLN: 5.0000 mL | Freq: Four times a day (QID) | OROMUCOSAL | 0 refills | Status: DC | PRN

## 2020-07-07 MED ORDER — HYDROMORPHONE HCL 1 MG/ML IJ SOLN
1.0000 mg | Freq: Once | INTRAMUSCULAR | Status: AC
Start: 2020-07-07 — End: 2020-07-07
  Administered 2020-07-07: 1 mg via INTRAVENOUS
  Filled 2020-07-07: qty 1

## 2020-07-07 MED ORDER — PSEUDOEPH-BROMPHEN-DM 30-2-10 MG/5ML PO SYRP: 5.0000 mL | ORAL_SOLUTION | Freq: Four times a day (QID) | ORAL | 0 refills | Status: DC | PRN

## 2020-07-07 MED ORDER — LIDOCAINE VISCOUS HCL 2 % MT SOLN
15.0000 mL | Freq: Once | OROMUCOSAL | Status: AC
  Administered 2020-07-07: 15 mL via OROMUCOSAL
  Filled 2020-07-07: qty 15

## 2020-07-07 MED ORDER — METHYLPREDNISOLONE SODIUM SUCC 125 MG IJ SOLR
125.0000 mg | Freq: Once | INTRAMUSCULAR | Status: AC
  Administered 2020-07-07: 125 mg via INTRAMUSCULAR
  Filled 2020-07-07: qty 2

## 2020-07-07 NOTE — ED Notes (Signed)
See triage note  States she develop sore throat last pm  Woke up with increased pain and swelling   No fever

## 2020-07-07 NOTE — ED Provider Notes (Signed)
[facility] Emergency Department Provider Note   ____________________________________________   Event Date/Time   First MD Initiated Contact with Patient 07/07/20 1047     (approximate)  I have reviewed the triage vital signs and the nursing notes.   HISTORY  Chief Complaint Sore Throat    HPI Jacqueline Cooke is a 40 y.o. female patient presents with acute onset of swelling in the throat and decreased voice volume.  Denies URI signs and symptoms.  Patient states pain with swallowing.  Denies recent travel or known contact with COVID-19.  No palliative measures for complaint.         Past Medical History:  Diagnosis Date  . Anemia   . Asthma   . COPD (chronic obstructive pulmonary disease) (HCC)   . Depression   . Hyperphosphatemia   . Hypertension   . Leukocytosis   . Substance abuse (HCC)    History of Cocaine abuse  . Tachycardia     Patient Active Problem List   Diagnosis Date Noted  . Tachycardia 12/09/2010  . SOB (shortness of breath) 12/09/2010    Past Surgical History:  Procedure Laterality Date  . CESAREAN SECTION  01/04/2013  . DILATION AND CURETTAGE OF UTERUS  2012   [facility]  2013 [facility]  . TRACHEOSTOMY  10/2010    Prior to Admission medications   Medication Sig Start Date End Date Taking? Authorizing Provider  brompheniramine-pseudoephedrine-DM 30-2-10 MG/5ML syrup Take 5 mLs by mouth 4 (four) times daily as needed. Mix with 5 mL of viscous lidocaine for swish and swallow 07/07/20  Yes Joni Reining, PA-C  lidocaine (XYLOCAINE) 2 % solution Use as directed 5 mLs in the mouth or throat every 6 (six) hours as needed for mouth pain. Mix with 5 mL of Bromfed-DM for swish and swallow 07/07/20  Yes Joni Reining, PA-C  methylPREDNISolone (MEDROL DOSEPAK) 4 MG TBPK tablet Take Tapered dose as directed 07/07/20  Yes Joni Reining, PA-C  albuterol (PROVENTIL,VENTOLIN) 90 MCG/ACT inhaler Inhale 2 puffs into the lungs  every 6 (six) hours as needed for wheezing. 12/09/10 12/04/11  Antonieta Iba, MD  albuterol (VENTOLIN HFA) 108 (90 Base) MCG/ACT inhaler Inhale 2 puffs into the lungs every 4 (four) hours as needed for wheezing or shortness of breath. 05/03/20   Willy Eddy, MD    Allergies Penicillins cross reactors  Family History  Family history unknown: Yes    Social History Social History   Tobacco Use  . Smoking status: Current Every Day Smoker    Packs/day: 0.10    Types: Cigarettes  . Smokeless tobacco: Never Used  Vaping Use  . Vaping Use: Never used  Substance Use Topics  . Alcohol use: Yes    Comment: social  . Drug use: Yes    Types: "Crack" cocaine, Marijuana    Comment: History of Marijuana, Cocaine some days    Review of Systems Constitutional: No fever/chills Eyes: No visual changes. ENT: No sore throat. Cardiovascular: Denies chest pain. Respiratory: Denies shortness of breath. Gastrointestinal: No abdominal pain.  No nausea, no vomiting.  No diarrhea.  No constipation. Genitourinary: Negative for dysuria. Musculoskeletal: Negative for back pain. Skin: Negative for rash. Neurological: Negative for headaches, focal weakness or numbness. Psychiatric:  Drug use. Allergic/Immunilogical: Penicillin. ____________________________________________   PHYSICAL EXAM:  VITAL SIGNS: ED Triage Vitals  Enc Vitals Group     BP 07/07/20 1039 (!) 126/100     Pulse Rate 07/07/20 1039 84  Resp 07/07/20 1039 16     Temp 07/07/20 1039 98.8 F (37.1 C)     Temp Source 07/07/20 1039 Oral     SpO2 07/07/20 1039 98 %     Weight 07/07/20 1040 150 lb (68 kg)     Height 07/07/20 1040 5\' 7"  (1.702 m)     Head Circumference --      Peak Flow --      Pain Score 07/07/20 1046 10     Pain Loc --      Pain Edu? --      Excl. in GC? --    Constitutional: Alert and oriented. Well appearing and in no acute distress. Eyes: Conjunctivae are normal. PERRL. EOMI. Head:  Atraumatic. Nose: No congestion/rhinnorhea. Mouth/Throat: Mucous membranes are moist.  Oropharynx erythematous. Neck: No stridor. Hematological/Lymphatic/Immunilogical: No cervical lymphadenopathy. Cardiovascular: Normal rate, regular rhythm. Grossly normal heart sounds.  Good peripheral circulation.  Elevated diastolic blood pressure. Respiratory: Normal respiratory effort.  No retractions. Lungs CTAB. Genitourinary: Deferred Musculoskeletal: No lower extremity tenderness nor edema.  No joint effusions. Neurologic:  Normal speech and language. No gross focal neurologic deficits are appreciated. No gait instability. Skin:  Skin is warm, dry and intact. No rash noted. Psychiatric: Mood and affect are normal. Speech and behavior are normal.  ____________________________________________   LABS (all labs ordered are listed, but only abnormal results are displayed)  Labs Reviewed  BASIC METABOLIC PANEL - Abnormal; Notable for the following components:      Result Value   Potassium 3.4 (*)    Calcium 8.8 (*)    All other components within normal limits  GROUP A STREP BY PCR  CBC WITH DIFFERENTIAL/PLATELET   ____________________________________________  EKG   ____________________________________________  RADIOLOGY I, 09/06/20, personally viewed and evaluated these images (plain radiographs) as part of my medical decision making, as well as reviewing the written report by the radiologist.  ED MD interpretation:    Official radiology report(s): DG Neck Soft Tissue  Result Date: 07/07/2020 CLINICAL DATA:  Sore throat.  History of previous tracheostomy. EXAM: NECK SOFT TISSUES - 1+ VIEW COMPARISON:  12/29/2019. FINDINGS: Overlying hair makes evaluation difficult on AP view. Stable tracheal irregularity, again possibly related to prior tracheostomy. Cervical airway widely patent. Epiglottis and retropharyngeal space normal. Loss of normal cervical lordosis. No acute bony  abnormality. Pulmonary apices are clear. IMPRESSION: Stable tracheal irregularity, again possibly related to prior tracheostomy. Cervical airway widely patent. Epiglottis and retropharyngeal space normal. Electronically Signed   By: 12/31/2019  Register   On: 07/07/2020 12:39    ____________________________________________   PROCEDURES  Procedure(s) performed (including Critical Care):  Procedures   ____________________________________________   INITIAL IMPRESSION / ASSESSMENT AND PLAN / ED COURSE  As part of my medical decision making, I reviewed the following data within the electronic MEDICAL RECORD NUMBER         Patient presents with acute onset of sore throat and swelling with decreased voice volume started last night.  Discussed negative strep results with patient.  Soft tissue neck was unremarkable.  Patient complaint physical exam consistent with viral pharyngitis and laryngitis.  Patient given discharge care instruction advised take medication as directed.  Patient advised follow-up PCP.      ____________________________________________   FINAL CLINICAL IMPRESSION(S) / ED DIAGNOSES  Final diagnoses:  Viral pharyngitis  Laryngitis     ED Discharge Orders         Ordered    lidocaine (XYLOCAINE) 2 % solution  Every 6 hours PRN        07/07/20 1323    brompheniramine-pseudoephedrine-DM 30-2-10 MG/5ML syrup  4 times daily PRN        07/07/20 1323    methylPREDNISolone (MEDROL DOSEPAK) 4 MG TBPK tablet        07/07/20 1323          *Please note:  Jacqueline Cooke was evaluated in Emergency Department on 07/07/2020 for the symptoms described in the history of present illness. She was evaluated in the context of the global COVID-19 pandemic, which necessitated consideration that the patient might be at risk for infection with the SARS-CoV-2 virus that causes COVID-19. Institutional protocols and algorithms that pertain to the evaluation of patients at risk for COVID-19  are in a state of rapid change based on information released by regulatory bodies including the CDC and federal and state organizations. These policies and algorithms were followed during the patient's care in the ED.  Some ED evaluations and interventions may be delayed as a result of limited staffing during and the pandemic.*   Note:  This document was prepared using Dragon voice recognition software and may include unintentional dictation errors.    Joni Reining, PA-C 07/07/20 1329    Sharyn Creamer, MD 07/07/20 1340

## 2020-07-07 NOTE — Discharge Instructions (Signed)
No acute findings on your labs or x-rays.  Follow discharge care instruction take medication as directed.  Advised voice rest and soft diet until symptoms resolve.

## 2020-07-07 NOTE — ED Triage Notes (Signed)
Pt c/o sore throat with swelling and hoarse voice that started last night. Airway is patent , respirations WNL, NAD noted at this time

## 2020-07-08 NOTE — Telephone Encounter (Signed)
Mirena rcvd/charged 07/03/2020

## 2020-08-13 ENCOUNTER — Other Ambulatory Visit: Payer: Self-pay

## 2020-08-13 ENCOUNTER — Encounter: Payer: Medicaid Other | Admitting: Obstetrics and Gynecology

## 2020-08-24 ENCOUNTER — Ambulatory Visit: Payer: Medicaid Other | Admitting: Advanced Practice Midwife

## 2020-08-24 ENCOUNTER — Ambulatory Visit: Payer: Medicaid Other | Admitting: Obstetrics and Gynecology

## 2020-08-25 ENCOUNTER — Encounter: Payer: Self-pay | Admitting: Advanced Practice Midwife

## 2020-08-25 ENCOUNTER — Other Ambulatory Visit: Payer: Self-pay

## 2020-08-25 ENCOUNTER — Ambulatory Visit (INDEPENDENT_AMBULATORY_CARE_PROVIDER_SITE_OTHER): Payer: Medicaid Other | Admitting: Advanced Practice Midwife

## 2020-08-25 VITALS — BP 100/62 | Wt 146.0 lb

## 2020-08-25 DIAGNOSIS — Z30432 Encounter for removal of intrauterine contraceptive device: Secondary | ICD-10-CM | POA: Diagnosis not present

## 2020-08-25 DIAGNOSIS — Z30011 Encounter for initial prescription of contraceptive pills: Secondary | ICD-10-CM | POA: Diagnosis not present

## 2020-08-25 DIAGNOSIS — F319 Bipolar disorder, unspecified: Secondary | ICD-10-CM | POA: Insufficient documentation

## 2020-08-25 MED ORDER — NORETHINDRONE 0.35 MG PO TABS: 1.0000 | ORAL_TABLET | Freq: Every day | ORAL | 3 refills | Status: DC

## 2020-08-25 NOTE — Progress Notes (Signed)
    GYNECOLOGY OFFICE PROCEDURE NOTE  Jacqueline Cooke is a 40 y.o. [id number] here for IUD removal. The patient currently has a Mirena IUD placed on 07/03/20. She has had off and on bleeding of moderate flow since the insertion. She understands that this is part of the normal adjustment to the new hormone and she is certain that she wants the IUD removed. She does not want Nexplanon or Depo. We discussed the increased risk of stroke/heart disease with estrogen containing methods since she is a smoker.  She would like to take a pill and she understands the importance of taking the progesterone only pill at the same time every day and never missing a dose for effectiveness.  Last pap smear was about 2 months ago at [facility] and was normal per her report.  Review of Systems  Constitutional: Negative for chills and fever.  HENT: Negative for congestion, ear discharge, ear pain, hearing loss, sinus pain and sore throat.   Eyes: Negative for blurred vision and double vision.  Respiratory: Negative for cough, shortness of breath and wheezing.   Cardiovascular: Negative for chest pain, palpitations and leg swelling.  Gastrointestinal: Negative for abdominal pain, blood in stool, constipation, diarrhea, heartburn, melena, nausea and vomiting.  Genitourinary: Negative for dysuria, flank pain, frequency, hematuria and urgency.  Musculoskeletal: Negative for back pain, joint pain and myalgias.  Skin: Negative for itching and rash.  Neurological: Negative for dizziness, tingling, tremors, sensory change, speech change, focal weakness, seizures, loss of consciousness, weakness and headaches.  Endo/Heme/Allergies: Negative for environmental allergies. Does not bruise/bleed easily.       Positive for irregular and heavy bleeding on IUD  Psychiatric/Behavioral: Positive for depression. Negative for hallucinations, memory loss, substance abuse and suicidal ideas. The patient is not  nervous/anxious and does not have insomnia.     Vital Signs: BP 100/62   Wt 146 lb (66.2 kg)   BMI 22.87 kg/m  Constitutional: Well nourished, well developed female in no acute distress.  HEENT: normal Skin: Warm and dry.  Cardiovascular: Regular rate and rhythm.   Extremity: no edema Respiratory: Clear to auscultation bilateral. Normal respiratory effort Neuro: DTRs 2+, Cranial nerves grossly intact Psych: Alert and Oriented x3. No memory deficits. Normal mood and affect.  MS: normal gait, normal bilateral lower extremity ROM/strength/stability.  Pelvic exam:  is not limited by body habitus EGBUS: within normal limits Vagina: within normal limits and with normal mucosa  Cervix: normal appearance   IUD Removal  Patient identified, informed consent performed, consent signed.  Time out was performed. Speculum placed in the vagina. The strings of the IUD were grasped and pulled using ring forceps. The IUD was successfully removed in its entirety.  Patient tolerated procedure well.    Tresea Mall, CNM [facility] 08/25/2020, 12:57 PM

## 2020-12-26 ENCOUNTER — Encounter: Payer: Self-pay | Admitting: *Deleted

## 2020-12-26 ENCOUNTER — Emergency Department: Payer: Medicaid Other

## 2020-12-26 ENCOUNTER — Other Ambulatory Visit: Payer: Self-pay

## 2020-12-26 DIAGNOSIS — Z79899 Other long term (current) drug therapy: Secondary | ICD-10-CM | POA: Insufficient documentation

## 2020-12-26 DIAGNOSIS — J45909 Unspecified asthma, uncomplicated: Secondary | ICD-10-CM | POA: Diagnosis not present

## 2020-12-26 DIAGNOSIS — Z20822 Contact with and (suspected) exposure to covid-19: Secondary | ICD-10-CM | POA: Diagnosis not present

## 2020-12-26 DIAGNOSIS — R059 Cough, unspecified: Secondary | ICD-10-CM | POA: Diagnosis present

## 2020-12-26 DIAGNOSIS — I10 Essential (primary) hypertension: Secondary | ICD-10-CM | POA: Insufficient documentation

## 2020-12-26 DIAGNOSIS — J069 Acute upper respiratory infection, unspecified: Secondary | ICD-10-CM | POA: Insufficient documentation

## 2020-12-26 DIAGNOSIS — J449 Chronic obstructive pulmonary disease, unspecified: Secondary | ICD-10-CM | POA: Diagnosis not present

## 2020-12-26 DIAGNOSIS — F17210 Nicotine dependence, cigarettes, uncomplicated: Secondary | ICD-10-CM | POA: Diagnosis not present

## 2020-12-26 LAB — CBC
HCT: 48 % — ABNORMAL HIGH (ref 36.0–46.0)
Hemoglobin: 16.6 g/dL — ABNORMAL HIGH (ref 12.0–15.0)
MCH: 32.9 pg (ref 26.0–34.0)
MCHC: 34.6 g/dL (ref 30.0–36.0)
MCV: 95 fL (ref 80.0–100.0)
Platelets: 232 10*3/uL (ref 150–400)
RBC: 5.05 MIL/uL (ref 3.87–5.11)
RDW: 12.7 % (ref 11.5–15.5)
WBC: 8.8 10*3/uL (ref 4.0–10.5)
nRBC: 0 % (ref 0.0–0.2)

## 2020-12-26 LAB — BASIC METABOLIC PANEL
Anion gap: 8 (ref 5–15)
BUN: 13 mg/dL (ref 6–20)
CO2: 23 mmol/L (ref 22–32)
Calcium: 9 mg/dL (ref 8.9–10.3)
Chloride: 106 mmol/L (ref 98–111)
Creatinine, Ser: 1.03 mg/dL — ABNORMAL HIGH (ref 0.44–1.00)
GFR, Estimated: >60 mL/min (ref >60)
Glucose, Bld: 131 mg/dL — ABNORMAL HIGH (ref 70–99)
Potassium: 3.8 mmol/L (ref 3.5–5.1)
Sodium: 137 mmol/L (ref 135–145)

## 2020-12-26 LAB — TROPONIN I (HIGH SENSITIVITY)
Troponin I (High Sensitivity): 4 ng/L (ref <18)
Troponin I (High Sensitivity): 6 ng/L (ref <18)

## 2020-12-26 NOTE — ED Triage Notes (Signed)
Green productive cough since Friday, associated with chest pain, dizziness, change in taste, sweats.

## 2020-12-26 NOTE — ED Notes (Signed)
Pt called for repeat VS and repeat trop, no answer

## 2020-12-27 ENCOUNTER — Emergency Department
Admission: EM | Admit: 2020-12-27 | Discharge: 2020-12-27 | Disposition: A | Discharge status: Home / Self Care | Payer: Medicaid Other | Attending: Emergency Medicine | Admitting: Emergency Medicine

## 2020-12-27 DIAGNOSIS — Z20822 Contact with and (suspected) exposure to covid-19: Secondary | ICD-10-CM

## 2020-12-27 DIAGNOSIS — J069 Acute upper respiratory infection, unspecified: Secondary | ICD-10-CM

## 2020-12-27 LAB — SARS CORONAVIRUS 2 (TAT 6-24 HRS): SARS Coronavirus 2: NEGATIVE

## 2020-12-27 MED ORDER — MAGIC MOUTHWASH W/LIDOCAINE: 5.0000 mL | Freq: Four times a day (QID) | ORAL | 0 refills | Status: DC | PRN

## 2020-12-27 MED ORDER — PREDNISONE 20 MG PO TABS
60.0000 mg | ORAL_TABLET | ORAL | Status: AC
  Administered 2020-12-27: 60 mg via ORAL
  Filled 2020-12-27: qty 3

## 2020-12-27 MED ORDER — NIRMATRELVIR/RITONAVIR (PAXLOVID)TABLET: 3.0000 | ORAL_TABLET | Freq: Two times a day (BID) | ORAL | 0 refills | Status: AC

## 2020-12-27 MED ORDER — BENZONATATE 100 MG PO CAPS: 100.0000 mg | ORAL_CAPSULE | Freq: Three times a day (TID) | ORAL | 0 refills | Status: DC | PRN

## 2020-12-27 MED ORDER — BENZONATATE 100 MG PO CAPS
100.0000 mg | ORAL_CAPSULE | Freq: Once | ORAL | Status: AC
  Administered 2020-12-27: 100 mg via ORAL
  Filled 2020-12-27: qty 1

## 2020-12-27 MED ORDER — HYDROCOD POLST-CPM POLST ER 10-8 MG/5ML PO SUER: 5.0000 mL | Freq: Two times a day (BID) | ORAL | 0 refills | Status: DC | PRN

## 2020-12-27 MED ORDER — PREDNISONE 20 MG PO TABS: 60.0000 mg | ORAL_TABLET | Freq: Every day | ORAL | 0 refills | Status: AC

## 2020-12-27 MED ORDER — ALBUTEROL SULFATE HFA 108 (90 BASE) MCG/ACT IN AERS: INHALATION_SPRAY | RESPIRATORY_TRACT | 0 refills | Status: DC

## 2020-12-27 NOTE — ED Provider Notes (Signed)
[facility] Emergency Department Provider Note  ____________________________________________   Event Date/Time   First MD Initiated Contact with Patient 12/27/20 0110     (approximate)  I have reviewed the triage vital signs and the nursing notes.   HISTORY  Chief Complaint Cough    HPI Jacqueline Cooke is a 40 y.o. female with medical history as listed below who presents for evaluation of a variety of symptoms over the last 24 to 48 hours.  She reports a cough, general malaise, subjective fever, loss of smell and taste, and sore throat.  She describes the symptoms as severe and nothing in particular makes them better or worse.  She has pain when she coughs.  She denies nausea and vomiting and abdominal pain.  She denies dysuria.  She reports having 2 vaccinations against COVID-19 but has not had a booster.  She has been infected with COVID-19 in the past.  She smokes daily but does not use nebulizers or inhalers regularly.     Past Medical History:  Diagnosis Date   Anemia    Asthma    COPD (chronic obstructive pulmonary disease) (HCC)    Depression    Hyperphosphatemia    Hypertension    Leukocytosis    Substance abuse (HCC)    History of Cocaine abuse   Tachycardia     Patient Active Problem List   Diagnosis Date Noted   Bipolar depression (HCC) 08/25/2020   Moderate persistent asthma without complication 09/25/2018   Migraine without aura and without status migrainosus, not intractable 01/28/2018   Tobacco abuse 01/28/2018   History of tracheostomy 12/20/2013   H/O: substance abuse (HCC) 08/24/2012   Tachycardia 12/09/2010   SOB (shortness of breath) 12/09/2010    Past Surgical History:  Procedure Laterality Date   CESAREAN SECTION  01/04/2013   DILATION AND CURETTAGE OF UTERUS  2012   [facility]  2013 [facility]   TRACHEOSTOMY  10/2010    Prior to Admission medications   Medication Sig Start Date End Date Taking?  Authorizing Provider  albuterol (VENTOLIN HFA) 108 (90 Base) MCG/ACT inhaler Inhale 2-4 puffs by mouth every 4 hours as needed for wheezing, cough, and/or shortness of breath 12/27/20  Yes Loleta Rose, MD  benzonatate (TESSALON PERLES) 100 MG capsule Take 1 capsule (100 mg total) by mouth 3 (three) times daily as needed for cough. 12/27/20  Yes Loleta Rose, MD  chlorpheniramine-HYDROcodone [facility] PENNKINETIC ER) 10-8 MG/5ML SUER Take 5 mLs by mouth every 12 (twelve) hours as needed for cough. 12/27/20  Yes Loleta Rose, MD  magic mouthwash w/lidocaine SOLN Take 5 mLs by mouth 4 (four) times daily as needed for mouth pain. Swish and spit, do not swallow the solution. 12/27/20  Yes Loleta Rose, MD  nirmatrelvir/ritonavir EUA (PAXLOVID) 20 x 150 MG & 10 x 100MG  TABS Take 3 tablets by mouth 2 (two) times daily for 5 days. Patient GFR is normal. Take nirmatrelvir (150 mg) two tablets twice daily for 5 days and ritonavir (100 mg) one tablet twice daily for 5 days. 12/27/20 01/01/21 Yes 03/03/21, MD  predniSONE (DELTASONE) 20 MG tablet Take 3 tablets (60 mg total) by mouth daily with breakfast for 5 days. 12/27/20 01/01/21 Yes 03/03/21, MD  albuterol (PROVENTIL,VENTOLIN) 90 MCG/ACT inhaler Inhale 2 puffs into the lungs every 6 (six) hours as needed for wheezing. 12/09/10 12/04/11  02/03/12, MD  hydrOXYzine (ATARAX/VISTARIL) 25 MG tablet hydroxyzine HCl 25 mg tablet  TAKE  1 TO 2 TABLETS BY MOUTH EVERY DAY AT BEDTIME AS NEEDED    [provider]  mirtazapine (REMERON) 15 MG tablet mirtazapine 15 mg tablet  TAKE 1 TABLET BY MOUTH EVERY DAY AT BEDTIME    [provider]  naproxen (NAPROSYN) 500 MG tablet Take 500 mg by mouth 2 (two) times daily as needed. 08/03/20   [provider]  norethindrone (MICRONOR) 0.35 MG tablet Take 1 tablet (0.35 mg total) by mouth daily. 08/25/20   Tresea MallGledhill, Jane, CNM  valACYclovir (VALTREX) 1000 MG tablet Take 1,000 mg by mouth 3 (three)  times daily. 07/01/20   [provider]    Allergies Penicillins cross reactors and Penicillins  Family History  Family history unknown: Yes    Social History Social History   Tobacco Use   Smoking status: Every Day    Packs/day: 0.10    Types: Cigarettes   Smokeless tobacco: Never  Vaping Use   Vaping Use: Never used  Substance Use Topics   Alcohol use: Yes    Comment: social   Drug use: Yes    Types: "Crack" cocaine, Marijuana    Comment: History of Marijuana, Cocaine some days    Review of Systems Constitutional: Subjective fever with general malaise. Eyes: No visual changes. ENT: Positive for sore throat Cardiovascular: Denies chest pain except for when she coughs. Respiratory: Positive for cough.  Some associated shortness of breath. Gastrointestinal: No abdominal pain.  No nausea, no vomiting.  No diarrhea.  No constipation. Genitourinary: Negative for dysuria. Musculoskeletal: Body aches.  Negative for neck pain.  Negative for back pain. Integumentary: Negative for rash. Neurological: Generalized weakness.  Negative for headaches, focal weakness or numbness.   ____________________________________________   PHYSICAL EXAM:  VITAL SIGNS: ED Triage Vitals [12/26/20 2002]  Enc Vitals Group     BP (!) 120/96     Pulse Rate 98     Resp 18     Temp 98.6 F (37 C)     Temp Source Oral     SpO2 97 %     Weight 56.7 kg (125 lb)     Height 1.727 m (5\' 8" )     Head Circumference      Peak Flow      Pain Score 10     Pain Loc      Pain Edu?      Excl. in GC?     Constitutional: Alert and oriented.  Appears uncomfortable but nontoxic. Eyes: Conjunctivae are normal.  Head: Atraumatic. Nose: No congestion/rhinnorhea. Mouth/Throat: Patient is wearing a mask. Neck: No stridor.  No meningeal signs.  Patient has a well-healed tracheostomy scar from about 10 years ago (per patient) . Cardiovascular: Normal rate, regular rhythm. Good peripheral  circulation. Respiratory: Normal respiratory effort.  No retractions.  Frequent cough but lung sounds are clear to auscultation bilaterally. Gastrointestinal: Soft and nontender. No distention.  Musculoskeletal: No lower extremity tenderness nor edema. No gross deformities of extremities. Neurologic:  Normal speech and language. No gross focal neurologic deficits are appreciated.  Skin:  Skin is warm, dry and intact. Psychiatric: Mood and affect are normal. Speech and behavior are normal.  ____________________________________________   LABS (all labs ordered are listed, but only abnormal results are displayed)  Labs Reviewed  BASIC METABOLIC PANEL - Abnormal; Notable for the following components:      Result Value   Glucose, Bld 131 (*)    Creatinine, Ser 1.03 (*)    All other components within  normal limits  CBC - Abnormal; Notable for the following components:   Hemoglobin 16.6 (*)    HCT 48.0 (*)    All other components within normal limits  SARS CORONAVIRUS 2 (TAT 6-24 HRS)  POC URINE PREG, ED  TROPONIN I (HIGH SENSITIVITY)  TROPONIN I (HIGH SENSITIVITY)   ____________________________________________  EKG  ED ECG REPORT I, Loleta Rose, the attending physician, personally viewed and interpreted this ECG.  Date: 12/26/2020 EKG Time: 20:12 Rate: 93 Rhythm: normal sinus rhythm QRS Axis: normal Intervals: normal ST/T Wave abnormalities: normal Narrative Interpretation: no evidence of acute ischemia  ____________________________________________  RADIOLOGY I, Loleta Rose, personally viewed and evaluated these images (plain radiographs) as part of my medical decision making, as well as reviewing the written report by the radiologist.  ED MD interpretation: No acute abnormalities on chest x-ray.  Official radiology report(s): DG Chest 2 View  Result Date: 12/26/2020 CLINICAL DATA:  Productive cough. EXAM: CHEST - 2 VIEW COMPARISON:  May 02, 2020 FINDINGS: The  heart size and mediastinal contours are within normal limits. Both lungs are clear. The visualized skeletal structures are unremarkable. IMPRESSION: No active cardiopulmonary disease. Electronically Signed   By: Aram Candela M.D.   On: 12/26/2020 20:36    ____________________________________________   PROCEDURES   Procedure(s) performed (including Critical Care):  Procedures   ____________________________________________   INITIAL IMPRESSION / MDM / ASSESSMENT AND PLAN / ED COURSE  As part of my medical decision making, I reviewed the following data within the electronic MEDICAL RECORD NUMBER Nursing notes reviewed and incorporated, Labs reviewed , EKG interpreted , Old chart reviewed, Radiograph reviewed , and Notes from prior ED visits   Differential diagnosis includes, but is not limited to, COVID-19, other nonspecific respiratory virus, community-acquired pneumonia, pneumothorax, bronchitis.  Patient's vital signs are within normal limits.  EKG is generally reassuring and nonischemic.  I personally reviewed the patient's imaging and agree with the radiologist's interpretation that there are no acute abnormalities on chest x-ray.  CBC, BMP, and high-sensitivity troponin x 2 are all within normal limits.  I talked with patient about the high probability that her symptoms are the result of COVID-19.  She has had it before and had similar symptoms.  I had my usual and customary COVID-19 discussion with her.  I provided prescriptions as listed below.  We had a long talk and I gave both verbal and written instructions along with a prescription for paxlovid; she knows that she is to check her COVID-19 results in the morning and MyChart and only fill the prescription for paxlovid if it comes back positive.  I gave my usual and customary management recommendations and return precautions.  She understands and agrees with the plan.      ____________________________________________  FINAL  CLINICAL IMPRESSION(S) / ED DIAGNOSES  Final diagnoses:  Viral URI with cough  Suspected COVID-19 virus infection     MEDICATIONS GIVEN DURING THIS VISIT:  Medications  predniSONE (DELTASONE) tablet 60 mg (has no administration in time range)  benzonatate (TESSALON) capsule 100 mg (has no administration in time range)     ED Discharge Orders          Ordered    chlorpheniramine-HYDROcodone (TUSSIONEX PENNKINETIC ER) 10-8 MG/5ML SUER  Every 12 hours PRN        12/27/20 0134    albuterol (VENTOLIN HFA) 108 (90 Base) MCG/ACT inhaler       Note to Pharmacy: Pharmacy may substitute brand and size for insurance-approved equivalent  12/27/20 0134    predniSONE (DELTASONE) 20 MG tablet  Daily with breakfast        12/27/20 0134    magic mouthwash w/lidocaine SOLN  4 times daily PRN       Note to Pharmacy: Please mix viscous lidocaine 2% with magic mouthwash solution so that the lidocaine comprises approximately 25 % of the total solution.   12/27/20 0134    nirmatrelvir/ritonavir EUA (PAXLOVID) 20 x 150 MG & 10 x 100MG  TABS  2 times daily        12/27/20 0134    benzonatate (TESSALON PERLES) 100 MG capsule  3 times daily PRN        12/27/20 0142             Note:  This document was prepared using Dragon voice recognition software and may include unintentional dictation errors.   12/29/20, MD 12/27/20 [phone]

## 2020-12-27 NOTE — Discharge Instructions (Addendum)
As we discussed, we believe your symptoms are caused by a respiratory virus.  Stay hydrated with water, Gatorade, or other clear fluids, and use ibupropfen and/or acetaminophen (Tylenol) as needed for pain and fever (according to label instructions).  We sent a nasal swab test from the Emergency Department (ED).  Please read through the included information in this paperwork for information about how to set up a MyChart account so that you can log in tomorrow for your test results (including COVID-19 swab results if one was obtained during your Emergency Department visit).  We provided a prescription for paxlovid, medication that can help reduce the duration of symptoms associated with a COVID-19 infection. DO NOT TAKE THE PAXLOVID UNLESS YOUR TEST RESULT COMES BACK POSITIVE.  Read through all the included information including the recommendations from the CDC.  If your test is positive, we recommend that you self-quarantine at home for about a week.  You should have as minimal contact as possible with anyone else including close family as per the [facility] paperwork guidelines listed below. Follow-up with your doctor by phone or online as needed and return immediately to the emergency department or call 911 only if you develop new or worsening symptoms that concern you.  You can find up-to-date information about COVID-19 in [place] by calling the [facility] Helpline: [phone]. You may also call 2-1-1, or [phone], or additional resources.  You can also find information online at PureLoser.gl, or on the Center for Disease Control (CDC) website at http://bradshaw.com/.

## 2020-12-27 NOTE — ED Notes (Signed)
ED Provider at bedside. 

## 2020-12-29 ENCOUNTER — Telehealth: Payer: Self-pay

## 2020-12-29 NOTE — Telephone Encounter (Signed)
Pt aware covid lab test negative, not detected °

## 2021-04-06 NOTE — Telephone Encounter (Signed)
No Show 01/20/2020 apt

## 2021-06-15 ENCOUNTER — Encounter: Payer: Medicaid Other | Admitting: Licensed Practical Nurse

## 2021-07-27 ENCOUNTER — Other Ambulatory Visit: Payer: Self-pay

## 2021-07-27 ENCOUNTER — Emergency Department
Admission: EM | Admit: 2021-07-27 | Discharge: 2021-07-27 | Disposition: A | Discharge status: Home / Self Care | Payer: Medicaid Other | Attending: Emergency Medicine | Admitting: Emergency Medicine

## 2021-07-27 ENCOUNTER — Emergency Department: Payer: Medicaid Other

## 2021-07-27 DIAGNOSIS — J449 Chronic obstructive pulmonary disease, unspecified: Secondary | ICD-10-CM | POA: Diagnosis not present

## 2021-07-27 DIAGNOSIS — I10 Essential (primary) hypertension: Secondary | ICD-10-CM | POA: Diagnosis not present

## 2021-07-27 DIAGNOSIS — J40 Bronchitis, not specified as acute or chronic: Secondary | ICD-10-CM | POA: Insufficient documentation

## 2021-07-27 DIAGNOSIS — R059 Cough, unspecified: Secondary | ICD-10-CM | POA: Diagnosis present

## 2021-07-27 MED ORDER — PREDNISONE 50 MG PO TABS: 50.0000 mg | ORAL_TABLET | Freq: Every day | ORAL | 0 refills | Status: DC

## 2021-07-27 MED ORDER — ALBUTEROL SULFATE HFA 108 (90 BASE) MCG/ACT IN AERS: 2.0000 | INHALATION_SPRAY | Freq: Four times a day (QID) | RESPIRATORY_TRACT | 2 refills | Status: DC | PRN

## 2021-07-27 MED ORDER — PREDNISONE 20 MG PO TABS
60.0000 mg | ORAL_TABLET | Freq: Once | ORAL | Status: AC
  Administered 2021-07-27: 60 mg via ORAL
  Filled 2021-07-27: qty 3

## 2021-07-27 MED ORDER — IPRATROPIUM-ALBUTEROL 0.5-2.5 (3) MG/3ML IN SOLN
3.0000 mL | Freq: Once | RESPIRATORY_TRACT | Status: AC
  Administered 2021-07-27: 3 mL via RESPIRATORY_TRACT
  Filled 2021-07-27: qty 3

## 2021-07-27 NOTE — ED Provider Notes (Signed)
? ?[facility] ?Provider Note ? ? ? Event Date/Time  ? First MD Initiated Contact with Patient 07/27/21 1233   ?  (approximate) ? ? ?History  ? ?URI ? ? ?HPI ? ?Jacqueline Cooke is a 41 y.o. female with history of COPD, hypertension who presents with complaints of cough, nasal congestion.  Patient reports has been coughing for several days.  She reports she has had green mucus but seems to have improved with Mucinex.  Denies fevers or chills.  She reports she no longer smokes ?  ? ? ?Physical Exam  ? ?Triage Vital Signs: ?ED Triage Vitals  ?Enc Vitals Group  ?   BP 07/27/21 1203 121/86  ?   Pulse Rate 07/27/21 1203 96  ?   Resp 07/27/21 1203 18  ?   Temp 07/27/21 1203 98.1 ?F (36.7 ?C)  ?   Temp Source 07/27/21 1203 Oral  ?   SpO2 07/27/21 1203 98 %  ?   Weight 07/27/21 1203 63.5 kg (140 lb)  ?   Height 07/27/21 1203 1.702 m (5\' 7" )  ?   Head Circumference --   ?   Peak Flow --   ?   Pain Score 07/27/21 1448 0  ?   Pain Loc --   ?   Pain Edu? --   ?   Excl. in [place]? --   ? ? ?Most recent vital signs: ?Vitals:  ? 07/27/21 1203 07/27/21 1448  ?BP: 121/86 132/74  ?Pulse: 96 84  ?Resp: 18 17  ?Temp: 98.1 ?F (36.7 ?C) 98.8 ?F (37.1 ?C)  ?SpO2: 98% 97%  ? ? ? ?General: Awake, no distress.  ?CV:  Good peripheral perfusion.  ?Resp:  Normal effort.  Scattered wheezing ?Abd:  No distention.  ?Other:  No calf swelling or pain ? ? ?ED Results / Procedures / Treatments  ? ?Labs ?(all labs ordered are listed, but only abnormal results are displayed) ?Labs Reviewed - No data to display ? ? ?EKG ? ? ? ? ?RADIOLOGY ?Chest x-ray viewed interpreted by me, no infiltrate or pneumonia ? ? ? ?PROCEDURES: ? ?Critical Care performed:  ? ?Procedures ? ? ?MEDICATIONS ORDERED IN ED: ?Medications  ?ipratropium-albuterol (DUONEB) 0.5-2.5 (3) MG/3ML nebulizer solution 3 mL (3 mLs Nebulization Given 07/27/21 1340)  ?ipratropium-albuterol (DUONEB) 0.5-2.5 (3) MG/3ML nebulizer solution 3 mL (3 mLs Nebulization Given 07/27/21  1340)  ?predniSONE (DELTASONE) tablet 60 mg (60 mg Oral Given 07/27/21 1339)  ? ? ? ?IMPRESSION / MDM / ASSESSMENT AND PLAN / ED COURSE  ?I reviewed the triage vital signs and the nursing notes. ? ? ? ?Patient presents with cough, URI symptoms but also with wheezing.  Suspect element of bronchospasm/COPD. ? ?Chest x-ray negative for pneumonia. ? ?Patient treated with DuoNebs, prednisone with significant improvement. ? ?We will provide prescription with prednisone, albuterol, return precautions discussed ? ? ? ?  ? ? ?FINAL CLINICAL IMPRESSION(S) / ED DIAGNOSES  ? ?Final diagnoses:  ?Bronchitis  ? ? ? ?Rx / DC Orders  ? ?ED Discharge Orders   ? ?      Ordered  ?  predniSONE (DELTASONE) 50 MG tablet  Daily with breakfast       ? 07/27/21 1434  ?  albuterol (VENTOLIN HFA) 108 (90 Base) MCG/ACT inhaler  Every 6 hours PRN       ? 07/27/21 1434  ? ?  ?  ? ?  ? ? ? ?Note:  This document was prepared using Dragon voice  recognition software and may include unintentional dictation errors. ?  ?Lavonia Drafts, MD ?07/27/21 1516 ? ?

## 2021-07-27 NOTE — ED Notes (Signed)
Pt gave verbal consent to DC ° °

## 2021-07-27 NOTE — ED Triage Notes (Signed)
Pt c/o cough with congestion that has progressed over the past 2 weeks, hurts to breath or cough.  ?

## 2021-09-26 ENCOUNTER — Encounter: Payer: Self-pay | Admitting: Emergency Medicine

## 2021-09-26 ENCOUNTER — Emergency Department
Admission: EM | Admit: 2021-09-26 | Discharge: 2021-09-26 | Disposition: A | Discharge status: Home / Self Care | Payer: Medicaid Other | Attending: Emergency Medicine | Admitting: Emergency Medicine

## 2021-09-26 ENCOUNTER — Other Ambulatory Visit: Payer: Self-pay

## 2021-09-26 ENCOUNTER — Emergency Department: Payer: Medicaid Other

## 2021-09-26 DIAGNOSIS — M542 Cervicalgia: Secondary | ICD-10-CM | POA: Insufficient documentation

## 2021-09-26 DIAGNOSIS — Y92481 Parking lot as the place of occurrence of the external cause: Secondary | ICD-10-CM | POA: Insufficient documentation

## 2021-09-26 DIAGNOSIS — S161XXA Strain of muscle, fascia and tendon at neck level, initial encounter: Secondary | ICD-10-CM

## 2021-09-26 LAB — POC URINE PREG, ED: Preg Test, Ur: NEGATIVE

## 2021-09-26 MED ORDER — LIDOCAINE 5 % EX PTCH
1.0000 | MEDICATED_PATCH | CUTANEOUS | Status: DC
  Administered 2021-09-26: 1 via TRANSDERMAL
  Filled 2021-09-26: qty 1

## 2021-09-26 MED ORDER — KETOROLAC TROMETHAMINE 30 MG/ML IJ SOLN
30.0000 mg | Freq: Once | INTRAMUSCULAR | Status: AC
  Administered 2021-09-26: 30 mg via INTRAMUSCULAR
  Filled 2021-09-26: qty 1

## 2021-09-26 MED ORDER — ACETAMINOPHEN 500 MG PO TABS
1000.0000 mg | ORAL_TABLET | Freq: Once | ORAL | Status: AC
  Administered 2021-09-26: 1000 mg via ORAL
  Filled 2021-09-26: qty 2

## 2021-09-26 MED ORDER — LIDOCAINE 5 % EX PTCH: 1.0000 | MEDICATED_PATCH | Freq: Two times a day (BID) | CUTANEOUS | 0 refills | Status: AC
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
## 2021-09-26 NOTE — Discharge Instructions (Signed)
Please take Tylenol and ibuprofen/Advil for your pain.  It is safe to take them together, or to alternate them every few hours.  Take up to 1000mg of Tylenol at a time, up to 4 times per day.  Do not take more than 4000 mg of Tylenol in 24 hours.  For ibuprofen, take 400-600 mg, 3 - 4 times per day.  Please use lidocaine patches at your site of pain.  Apply 1 patch at a time, leave on for 12 hours, then remove for 12 hours.  12 hours on, 12 hours off.  Do not apply more than 1 patch at a time.  
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
## 2021-10-14 ENCOUNTER — Ambulatory Visit: Payer: Medicaid Other

## 2021-12-05 ENCOUNTER — Emergency Department: Payer: Medicaid Other

## 2021-12-05 ENCOUNTER — Emergency Department
Admission: EM | Admit: 2021-12-05 | Discharge: 2021-12-06 | Disposition: A | Discharge status: Home / Self Care | Payer: Medicaid Other | Attending: Emergency Medicine | Admitting: Emergency Medicine

## 2021-12-05 ENCOUNTER — Other Ambulatory Visit: Payer: Self-pay

## 2021-12-05 ENCOUNTER — Encounter: Payer: Self-pay | Admitting: Emergency Medicine

## 2021-12-05 DIAGNOSIS — J449 Chronic obstructive pulmonary disease, unspecified: Secondary | ICD-10-CM | POA: Insufficient documentation

## 2021-12-05 DIAGNOSIS — M25461 Effusion, right knee: Secondary | ICD-10-CM | POA: Diagnosis not present

## 2021-12-05 DIAGNOSIS — I10 Essential (primary) hypertension: Secondary | ICD-10-CM | POA: Insufficient documentation

## 2021-12-05 DIAGNOSIS — M25561 Pain in right knee: Secondary | ICD-10-CM | POA: Insufficient documentation

## 2021-12-05 DIAGNOSIS — M7989 Other specified soft tissue disorders: Secondary | ICD-10-CM | POA: Insufficient documentation

## 2021-12-05 LAB — BASIC METABOLIC PANEL
Anion gap: 4 — ABNORMAL LOW (ref 5–15)
BUN: 16 mg/dL (ref 6–20)
CO2: 24 mmol/L (ref 22–32)
Calcium: 8.9 mg/dL (ref 8.9–10.3)
Chloride: 112 mmol/L — ABNORMAL HIGH (ref 98–111)
Creatinine, Ser: 0.84 mg/dL (ref 0.44–1.00)
GFR, Estimated: >60 mL/min (ref >60)
Glucose, Bld: 111 mg/dL — ABNORMAL HIGH (ref 70–99)
Potassium: 3.9 mmol/L (ref 3.5–5.1)
Sodium: 140 mmol/L (ref 135–145)

## 2021-12-05 LAB — CBC WITH DIFFERENTIAL/PLATELET
Abs Immature Granulocytes: 0.03 10*3/uL (ref 0.00–0.07)
Basophils Absolute: 0 10*3/uL (ref 0.0–0.1)
Basophils Relative: 1 %
Eosinophils Absolute: 0.2 10*3/uL (ref 0.0–0.5)
Eosinophils Relative: 2 %
HCT: 37.8 % (ref 36.0–46.0)
Hemoglobin: 12.7 g/dL (ref 12.0–15.0)
Immature Granulocytes: 0 %
Lymphocytes Relative: 39 %
Lymphs Abs: 3 10*3/uL (ref 0.7–4.0)
MCH: 32.8 pg (ref 26.0–34.0)
MCHC: 33.6 g/dL (ref 30.0–36.0)
MCV: 97.7 fL (ref 80.0–100.0)
Monocytes Absolute: 0.6 10*3/uL (ref 0.1–1.0)
Monocytes Relative: 8 %
Neutro Abs: 3.8 10*3/uL (ref 1.7–7.7)
Neutrophils Relative %: 50 %
Platelets: 224 10*3/uL (ref 150–400)
RBC: 3.87 MIL/uL (ref 3.87–5.11)
RDW: 12.3 % (ref 11.5–15.5)
WBC: 7.8 10*3/uL (ref 4.0–10.5)
nRBC: 0 % (ref 0.0–0.2)

## 2021-12-05 LAB — URIC ACID: Uric Acid, Serum: 3.3 mg/dL (ref 2.5–7.1)

## 2021-12-05 MED ORDER — TRAMADOL HCL 50 MG PO TABS: 50.0000 mg | ORAL_TABLET | Freq: Four times a day (QID) | ORAL | 0 refills | Status: DC | PRN

## 2021-12-05 MED ORDER — FENTANYL CITRATE PF 50 MCG/ML IJ SOSY
50.0000 ug | PREFILLED_SYRINGE | Freq: Once | INTRAMUSCULAR | Status: AC
  Administered 2021-12-05: 50 ug via INTRAVENOUS
  Filled 2021-12-05: qty 1

## 2021-12-05 MED ORDER — DEXAMETHASONE SODIUM PHOSPHATE 10 MG/ML IJ SOLN
10.0000 mg | Freq: Once | INTRAMUSCULAR | Status: AC
  Administered 2021-12-05: 10 mg via INTRAVENOUS
  Filled 2021-12-05: qty 1

## 2021-12-05 MED ORDER — PREDNISONE 10 MG (21) PO TBPK: ORAL_TABLET | ORAL | 0 refills | Status: DC

## 2021-12-05 MED ORDER — FENTANYL CITRATE PF 50 MCG/ML IJ SOSY
50.0000 ug | PREFILLED_SYRINGE | Freq: Once | INTRAMUSCULAR | Status: DC
Start: 2021-12-05 — End: 2021-12-05

## 2021-12-05 MED ORDER — GADOBUTROL 1 MMOL/ML IV SOLN
8.0000 mL | Freq: Once | INTRAVENOUS | Status: AC | PRN
  Administered 2021-12-05: 8 mL via INTRAVENOUS

## 2021-12-05 MED ORDER — OXYCODONE-ACETAMINOPHEN 5-325 MG PO TABS
1.0000 | ORAL_TABLET | Freq: Once | ORAL | Status: AC
  Administered 2021-12-05: 1 via ORAL
  Filled 2021-12-05: qty 1

## 2021-12-05 MED ORDER — FENTANYL CITRATE PF 50 MCG/ML IJ SOSY
50.0000 ug | PREFILLED_SYRINGE | Freq: Once | INTRAMUSCULAR | Status: AC
Start: 2021-12-05 — End: 2021-12-05
  Administered 2021-12-05: 50 ug via INTRAVENOUS
  Filled 2021-12-05: qty 1

## 2021-12-05 NOTE — ED Provider Notes (Signed)
[facility] Provider Note    Event Date/Time   First MD Initiated Contact with Patient 12/05/21 1828     (approximate)   History   Knee Pain   HPI  Jacqueline Cooke is a 41 y.o. female with history of PID, hyperphosphatemia, leukocytosis, hypertension COPD and substance abuse presents emergency department with complaints of right knee pain and swelling.  Patient denies any known injury.  Was seen at urgent care yesterday and given Toradol and meloxicam.  States its become so painful she cannot straighten her leg.      Physical Exam   Triage Vital Signs: ED Triage Vitals  Enc Vitals Group     BP 12/05/21 1746 (!) 125/91     Pulse Rate 12/05/21 1746 72     Resp 12/05/21 1746 14     Temp 12/05/21 1746 98 F (36.7 C)     Temp Source 12/05/21 1746 Oral     SpO2 12/05/21 1746 98 %     Weight 12/05/21 1742 171 lb 15.3 oz (78 kg)     Height 12/05/21 1742 5\' 10"  (1.778 m)     Head Circumference --      Peak Flow --      Pain Score 12/05/21 1742 10     Pain Loc --      Pain Edu? --      Excl. in [place]? --     Most recent vital signs: Vitals:   12/05/21 1746  BP: (!) 125/91  Pulse: 72  Resp: 14  Temp: 98 F (36.7 C)  SpO2: 98%     General: Awake, no distress.   CV:  Good peripheral perfusion. regular rate and  rhythm Resp:  Normal effort. Abd:  No distention.   Other:  Right leg with decreased range of motion with extension, patient asked to keep it in a bent position, tender at the posterior part of the right knee and calf, some swelling noted suprapatella region, neurovascular appears to be intact   ED Results / Procedures / Treatments   Labs (all labs ordered are listed, but only abnormal results are displayed) Labs Reviewed  BASIC METABOLIC PANEL - Abnormal; Notable for the following components:      Result Value   Chloride 112 (*)    Glucose, Bld 111 (*)    Anion gap 4 (*)    All other components within normal limits  CBC WITH  DIFFERENTIAL/PLATELET  URIC ACID     EKG     RADIOLOGY X-ray of the right knee, ultrasound for DVT    PROCEDURES:   Procedures   MEDICATIONS ORDERED IN ED: Medications  dexamethasone (DECADRON) injection 10 mg (has no administration in time range)  fentaNYL (SUBLIMAZE) injection 50 mcg (has no administration in time range)  oxyCODONE-acetaminophen (PERCOCET/ROXICET) 5-325 MG per tablet 1 tablet (1 tablet Oral Given 12/05/21 1848)  fentaNYL (SUBLIMAZE) injection 50 mcg (50 mcg Intravenous Given 12/05/21 2055)  gadobutrol (GADAVIST) 1 MMOL/ML injection 8 mL (8 mLs Intravenous Contrast Given 12/05/21 2239)     IMPRESSION / MDM / ASSESSMENT AND PLAN / ED COURSE  I reviewed the triage vital signs and the nursing notes.                              Differential diagnosis includes, but is not limited to, right knee effusion, septic arthritis, DVT  Patient's presentation is most consistent with acute complicated  illness / injury requiring diagnostic workup.   X-ray and ultrasound ordered.  X-ray of the right knee individually interpreted and reviewed by me as being negative.  Ultrasound of the right lower extremity independently reviewed and interpreted by me as being negative for DVT  Due to the patient continued to have knee pain and has not been helped by Percocet we will give the patient fentanyl 50 mcg IV, labs were ordered to assess for septic arthritis  Labs are reassuring, patient does not have infection or gout noted in her lab work.  MRI of the right knee with and without contrast preliminary report from radiology states no effusion, no septic arthritis and no necrosis.  With this preliminary reading I feel that if there is internal derangement this can be followed as outpatient.  Do not feel that the patient will need to be admitted.  She was given 10 of Decadron IV, additional fentanyl 50 mcg.  Discharged in stable condition in the care of her friend.  She is to  continue to apply ice.  Wear the knee immobilizer even when sleeping so she will not bend the knee.  Return if worsening.  She is in agreement with the treatment plan   FINAL CLINICAL IMPRESSION(S) / ED DIAGNOSES   Final diagnoses:  Acute pain of right knee     Rx / DC Orders   ED Discharge Orders          Ordered    predniSONE (STERAPRED UNI-PAK 21 TAB) 10 MG (21) TBPK tablet        12/05/21 2323    traMADol (ULTRAM) 50 MG tablet  Every 6 hours PRN        12/05/21 2323             Note:  This document was prepared using Dragon voice recognition software and may include unintentional dictation errors.    Faythe Ghee, PA-C 12/05/21 Dorothy Puffer    Sharyn Creamer, MD 12/07/21 Moses Manners

## 2021-12-05 NOTE — ED Triage Notes (Signed)
Pt reports hurt her right knee a year ago when she fell in a store and has had intermittent issues since then. Pt reports over the last 2 weeks her right knee has been hurting all the way down to her ankle so she went to [facility] yesterday and they did an x-ray and told her everything was fine but pt reports she is still in pain

## 2021-12-05 NOTE — Discharge Instructions (Signed)
Follow-up with [facility].  They have a walk-in clinic or can call and make an appointment.  Continue to apply ice to the right knee.  Sleep in the knee immobilizer to alleviate you bending the knee and your sleep. Take the Sterapred as prescribed.  Pain medication as needed. Return if worsening.

## 2022-02-07 ENCOUNTER — Ambulatory Visit
Admission: EM | Admit: 2022-02-07 | Discharge: 2022-02-07 | Disposition: A | Discharge status: Home / Self Care | Payer: Medicaid Other | Attending: Physician Assistant | Admitting: Physician Assistant

## 2022-02-07 ENCOUNTER — Encounter: Payer: Self-pay | Admitting: Emergency Medicine

## 2022-02-07 DIAGNOSIS — R112 Nausea with vomiting, unspecified: Secondary | ICD-10-CM | POA: Diagnosis not present

## 2022-02-07 DIAGNOSIS — Z113 Encounter for screening for infections with a predominantly sexual mode of transmission: Secondary | ICD-10-CM | POA: Diagnosis not present

## 2022-02-07 DIAGNOSIS — R051 Acute cough: Secondary | ICD-10-CM | POA: Diagnosis not present

## 2022-02-07 DIAGNOSIS — Z1152 Encounter for screening for COVID-19: Secondary | ICD-10-CM | POA: Insufficient documentation

## 2022-02-07 DIAGNOSIS — A599 Trichomoniasis, unspecified: Secondary | ICD-10-CM | POA: Diagnosis not present

## 2022-02-07 DIAGNOSIS — I10 Essential (primary) hypertension: Secondary | ICD-10-CM | POA: Diagnosis not present

## 2022-02-07 DIAGNOSIS — F319 Bipolar disorder, unspecified: Secondary | ICD-10-CM | POA: Insufficient documentation

## 2022-02-07 DIAGNOSIS — J4489 Other specified chronic obstructive pulmonary disease: Secondary | ICD-10-CM | POA: Diagnosis not present

## 2022-02-07 DIAGNOSIS — R1084 Generalized abdominal pain: Secondary | ICD-10-CM | POA: Insufficient documentation

## 2022-02-07 LAB — URINALYSIS, ROUTINE W REFLEX MICROSCOPIC
Glucose, UA: NEGATIVE mg/dL
Hgb urine dipstick: NEGATIVE
Nitrite: NEGATIVE
Protein, ur: 30 mg/dL — AB
Specific Gravity, Urine: >1.03 — ABNORMAL HIGH (ref 1.005–1.030)
pH: 5.5 (ref 5.0–8.0)

## 2022-02-07 LAB — URINALYSIS, MICROSCOPIC (REFLEX)

## 2022-02-07 LAB — PREGNANCY, URINE: Preg Test, Ur: NEGATIVE

## 2022-02-07 LAB — SARS CORONAVIRUS 2 BY RT PCR: SARS Coronavirus 2 by RT PCR: NEGATIVE

## 2022-02-07 MED ORDER — ONDANSETRON HCL 4 MG/2ML IJ SOLN
8.0000 mg | Freq: Once | INTRAMUSCULAR | Status: AC
  Administered 2022-02-07: 8 mg via INTRAMUSCULAR

## 2022-02-07 MED ORDER — METRONIDAZOLE 500 MG PO TABS: 500.0000 mg | ORAL_TABLET | Freq: Two times a day (BID) | ORAL | 0 refills | Status: AC

## 2022-02-07 MED ORDER — ONDANSETRON 4 MG PO TBDP: 4.0000 mg | ORAL_TABLET | Freq: Four times a day (QID) | ORAL | 0 refills | Status: DC | PRN

## 2022-02-07 NOTE — Discharge Instructions (Signed)
ABDOMINAL PAIN: You may take Tylenol for pain relief. Use medications as directed including antiemetics and antidiarrheal medications if suggested or prescribed. You should increase fluids and electrolytes as well as rest over these next several days. If you have any questions or concerns, or if your symptoms are not improving or if especially if they acutely worsen, please call or stop back to the clinic immediately and we will be happy to help you or go to the ER   ABDOMINAL PAIN RED FLAGS: Seek immediate further care if: symptoms remain the same or worsen over the next 3-7 days, you are unable to keep fluids down, you see blood or mucus in your stool, you vomit black or dark red material, you have a fever of 101.F or higher, you have localized and/or persistent abdominal pain    -The COVID test will be back tomorrow.  If positive you need to isolate 5 days and wear a mask for 5 days.  We will contact you if the result is positive.  If you do not hear from [place] is negative and your symptoms could be related to another virus. - Your trichomonas test was positive.  I have sent antibiotics for this.  Started soon as possible.  You are partner needs to be treated for this as well and you both need to abstain from sexual contact until 1 week after you both have completed treatment. - Results of your gonorrhea and Chlamydia testing will be back tomorrow we will call with any positives and recommend further treatment from there. - If you have worsening abdominal pain or running fever you should go to the emergency department for further work-up.  I did send Zofran for your symptoms as well.

## 2022-02-07 NOTE — ED Provider Notes (Signed)
[facility]    CSN: [id number] Arrival date & time: 02/07/22  1924      History   Chief Complaint Chief Complaint  Patient presents with   Abdominal Pain   Emesis    HPI BAHAREH DENISCO is a 41 y.o. female presenting with her partner for abdominal cramping and nausea/vomiting that suddenly began about an hour prior to arrival to urgent care.  She says symptoms started after she ate chitterlings. She reports other people also a chitterlings but have not experienced same symptoms.  She denies fever.  She has had cough and congestion for the past 2 to 3 days.  She denies any chest pain or breathing difficulty.  No urinary symptoms.  Denies any change in BMs.  No headaches or dizziness.  Has not been constipated or had diarrhea.  Patient denies any sick contacts.  Patient is unsure of her last menstrual period.  Does not believe she could be pregnant.  Has not taken any medication for her symptoms.  Her medical history significant for asthma, COPD, hypertension, bipolar depression, migraines and substance abuse.  HPI  Past Medical History:  Diagnosis Date   Anemia    Asthma    COPD (chronic obstructive pulmonary disease) ([place])    Depression    Hyperphosphatemia    Hypertension    Leukocytosis    Substance abuse ([place])    History of Cocaine abuse   Tachycardia     Patient Active Problem List   Diagnosis Date Noted   Bipolar depression ([place]) 08/25/2020   Moderate persistent asthma without complication [id number]   Migraine without aura and without status migrainosus, not intractable 01/28/2018   Tobacco abuse 01/28/2018   History of tracheostomy 12/20/2013   H/O: substance abuse ([place]) 08/24/2012   Tachycardia 12/09/2010   SOB (shortness of breath) 12/09/2010    Past Surgical History:  Procedure Laterality Date   CESAREAN SECTION  01/04/2013   DILATION AND CURETTAGE OF UTERUS  2012   [facility] &  2013 [facility]   TRACHEOSTOMY  10/2010    OB History      Gravida  6   Para  2   Term  2   Preterm      AB  4   Living  2      SAB  4   IAB      Ectopic      Multiple      Live Births  2            Home Medications    Prior to Admission medications   Medication Sig Start Date End Date Taking? Authorizing Provider  metroNIDAZOLE (FLAGYL) 500 MG tablet Take 1 tablet (500 mg total) by mouth 2 (two) times daily for 7 days. 02/07/22 02/14/22 Yes Laurene Footman B, PA-C  ondansetron (ZOFRAN-ODT) 4 MG disintegrating tablet Take 1 tablet (4 mg total) by mouth every 6 (six) hours as needed for nausea or vomiting. 02/07/22  Yes Laurene Footman B, PA-C  albuterol (PROVENTIL,VENTOLIN) 90 MCG/ACT inhaler Inhale 2 puffs into the lungs every 6 (six) hours as needed for wheezing. 12/09/10 12/04/11  Minna Merritts, MD  albuterol (VENTOLIN HFA) 108 (90 Base) MCG/ACT inhaler Inhale 2-4 puffs by mouth every 4 hours as needed for wheezing, cough, and/or shortness of breath 12/27/20   Hinda Kehr, MD  albuterol (VENTOLIN HFA) 108 (90 Base) MCG/ACT inhaler Inhale 2 puffs into the lungs every 6 (six) hours as needed for wheezing or shortness  of breath. 07/27/21   Lavonia Drafts, MD  benzonatate (TESSALON PERLES) 100 MG capsule Take 1 capsule (100 mg total) by mouth 3 (three) times daily as needed for cough. 12/27/20   Hinda Kehr, MD  chlorpheniramine-HYDROcodone [facility] PENNKINETIC ER) 10-8 MG/5ML SUER Take 5 mLs by mouth every 12 (twelve) hours as needed for cough. 12/27/20   Hinda Kehr, MD  hydrOXYzine (ATARAX/VISTARIL) 25 MG tablet hydroxyzine HCl 25 mg tablet  TAKE 1 TO 2 TABLETS BY MOUTH EVERY DAY AT BEDTIME AS NEEDED    [provider]  lidocaine (LIDODERM) 5 % Place 1 patch onto the skin every 12 (twelve) hours. Remove & Discard patch within 12 hours or as directed by MD 09/26/21 09/26/22  Vladimir Crofts, MD  magic mouthwash w/lidocaine SOLN Take 5 mLs by mouth 4 (four) times daily as needed for mouth pain. Swish and spit, do not  swallow the solution. 12/27/20   Hinda Kehr, MD  mirtazapine (REMERON) 15 MG tablet mirtazapine 15 mg tablet  TAKE 1 TABLET BY MOUTH EVERY DAY AT BEDTIME    [provider]  naproxen (NAPROSYN) 500 MG tablet Take 500 mg by mouth 2 (two) times daily as needed. 08/03/20   [provider]  norethindrone (MICRONOR) 0.35 MG tablet Take 1 tablet (0.35 mg total) by mouth daily. 08/25/20   Rod Can, CNM  predniSONE (STERAPRED UNI-PAK 21 TAB) 10 MG (21) TBPK tablet Take 6 pills on day one then decrease by 1 pill each day 12/05/21   Versie Starks, PA-C  traMADol (ULTRAM) 50 MG tablet Take 1 tablet (50 mg total) by mouth every 6 (six) hours as needed. 12/05/21   Fisher, Linden Dolin, PA-C  valACYclovir (VALTREX) 1000 MG tablet Take 1,000 mg by mouth 3 (three) times daily. 07/01/20   [provider]    Family History Family History  Family history unknown: Yes    Social History Social History   Tobacco Use   Smoking status: Every Day    Packs/day: 0.10    Types: Cigarettes   Smokeless tobacco: Never  Vaping Use   Vaping Use: Never used  Substance Use Topics   Alcohol use: Yes    Comment: social   Drug use: Yes    Types: "Crack" cocaine, Marijuana    Comment: History of Marijuana, Cocaine some days     Allergies   Penicillins cross reactors and Penicillins   Review of Systems Review of Systems  Constitutional:  Positive for fatigue. Negative for chills, diaphoresis and fever.  HENT:  Positive for congestion and rhinorrhea. Negative for ear pain, sinus pressure, sinus pain and sore throat.   Respiratory:  Positive for cough. Negative for shortness of breath.   Gastrointestinal:  Positive for nausea and vomiting. Negative for abdominal pain, constipation and diarrhea.  Musculoskeletal:  Negative for arthralgias and myalgias.  Skin:  Negative for rash.  Neurological:  Negative for weakness and headaches.  Hematological:  Negative for adenopathy.     Physical  Exam Triage Vital Signs ED Triage Vitals  Enc Vitals Group     BP 02/07/22 1932 114/85     Pulse Rate 02/07/22 1932 89     Resp 02/07/22 1932 18     Temp 02/07/22 1932 98.5 F (36.9 C)     Temp Source 02/07/22 1932 Oral     SpO2 02/07/22 1932 99 %     Weight --      Height --      Head Circumference --  Peak Flow --      Pain Score 02/07/22 1929 10     Pain Loc --      Pain Edu? --      Excl. in [place]? --    No data found.  Updated Vital Signs BP 114/85 (BP Location: Right Arm)   Pulse 89   Temp 98.5 F (36.9 C) (Oral)   Resp 18   LMP  (LMP Unknown)   SpO2 99%   Physical Exam Vitals and nursing note reviewed.  Constitutional:      General: She is not in acute distress.    Appearance: Normal appearance. She is ill-appearing. She is not toxic-appearing.  HENT:     Head: Normocephalic and atraumatic.     Nose: Congestion present.     Mouth/Throat:     Mouth: Mucous membranes are moist.     Pharynx: Oropharynx is clear.  Eyes:     General: No scleral icterus.       Right eye: No discharge.        Left eye: No discharge.     Conjunctiva/sclera: Conjunctivae normal.  Cardiovascular:     Rate and Rhythm: Normal rate and regular rhythm.     Heart sounds: Normal heart sounds.  Pulmonary:     Effort: Pulmonary effort is normal. No respiratory distress.     Breath sounds: Normal breath sounds.  Abdominal:     General: There is distension.     Palpations: Abdomen is soft.     Tenderness: There is abdominal tenderness (generalized). There is no right CVA tenderness, left CVA tenderness, guarding or rebound.  Musculoskeletal:     Cervical back: Neck supple.  Skin:    General: Skin is dry.  Neurological:     General: No focal deficit present.     Mental Status: She is alert and oriented to person, place, and time. Mental status is at baseline.     Cranial Nerves: No cranial nerve deficit.     Motor: No weakness.     Gait: Gait normal.  Psychiatric:        Mood  and Affect: Mood normal.        Behavior: Behavior normal.        Thought Content: Thought content normal.      UC Treatments / Results  Labs (all labs ordered are listed, but only abnormal results are displayed) Labs Reviewed  URINALYSIS, ROUTINE W REFLEX MICROSCOPIC - Abnormal; Notable for the following components:      Result Value   APPearance HAZY (*)    Specific Gravity, Urine >1.030 (*)    Bilirubin Urine SMALL (*)    Ketones, ur TRACE (*)    Protein, ur 30 (*)    Leukocytes,Ua TRACE (*)    All other components within normal limits  URINALYSIS, MICROSCOPIC (REFLEX) - Abnormal; Notable for the following components:   Bacteria, UA FEW (*)    Trichomonas, UA PRESENT (*)    All other components within normal limits  SARS CORONAVIRUS 2 BY RT PCR  PREGNANCY, URINE  CYTOLOGY, (ORAL, ANAL, URETHRAL) ANCILLARY ONLY    EKG   Radiology No results found.  Procedures Procedures (including critical care time)  Medications Ordered in UC Medications  ondansetron (ZOFRAN) injection 8 mg (8 mg Intramuscular Given 02/07/22 1951)    Initial Impression / Assessment and Plan / UC Course  I have reviewed the triage vital signs and the nursing notes.  Pertinent labs & imaging results that were  available during my care of the patient were reviewed by me and considered in my medical decision making (see chart for details).   41 year old female presenting for sudden onset of abdominal cramping, nausea and vomiting about an hour prior to arrival to urgent care.  Has been congested and coughing for the past 2 to 3 days.  No associated fever, breathing difficulty.  Denies urinary symptoms or diarrhea or constipation.  No headaches or dizziness.  Vitals are normal and stable.  Patient is ill-appearing but nontoxic.  She is actively vomiting in exam room.  On exam she does have nasal congestion and coughs a few times.  Her chest is clear auscultation heart regular rate and rhythm.  Abdomen is  soft but distended.  She has generalized tenderness to palpation.  Urine pregnancy is negative. Urinalysis also obtained.  Patient given 8 mg IM Zofran for active vomiting.  Urinalysis shows hazy urine with greater than 1.030 specific gravity, ketones and trace leukocytes.  On microscopic analysis trichomonas is seen.  Discussed results with patient.  She says she is only sexually active with 1 partner.  She says she does not think he has other partners.  Explained to her that it is sexually transmitted infection.  I suggested GC/chlamydia testing and she agrees.  Patient performs vaginal self swab.  Advised her that results will be back tomorrow we will contact her with any positives and treat her from there.  She is denying any symptoms of vaginal discharge or odor.  We will treat trichomonas with metronidazole at this time.  Patient is reporting some improvement in her abdominal cramping and nausea/vomiting after receiving the IM Zofran.  Suspect patient's symptoms could be related to viral infection.  Pending COVID test.  Reviewed current CDC guidelines, isolation protocol and ED precautions if COVID-positive.  Advised if that test is negative, could be another virus and continue with increasing rest and fluids and Zofran.  Tylenol for abdominal pain.  We also discussed the possibility that the trichomonas could be causing the symptoms.  I have sent metronidazole for that and awaiting other STI testing before treatment.  Discussed with patient that she should go to the emergency department if abdominal pain worsens or she develops a fever or has intractable vomiting, changes in BMs, any chest pain or breathing difficulty, wheezing, etc.  *** Final Clinical Impressions(s) / UC Diagnoses   Final diagnoses:  Generalized abdominal pain  Nausea and vomiting, unspecified vomiting type  Trichomonas infection  Routine screening for STI (sexually transmitted infection)  Acute cough      Discharge Instructions      ABDOMINAL PAIN: You may take Tylenol for pain relief. Use medications as directed including antiemetics and antidiarrheal medications if suggested or prescribed. You should increase fluids and electrolytes as well as rest over these next several days. If you have any questions or concerns, or if your symptoms are not improving or if especially if they acutely worsen, please call or stop back to the clinic immediately and we will be happy to help you or go to the ER   ABDOMINAL PAIN RED FLAGS: Seek immediate further care if: symptoms remain the same or worsen over the next 3-7 days, you are unable to keep fluids down, you see blood or mucus in your stool, you vomit black or dark red material, you have a fever of 101.F or higher, you have localized and/or persistent abdominal pain    -The COVID test will be back tomorrow.  If  positive you need to isolate 5 days and wear a mask for 5 days.  We will contact you if the result is positive.  If you do not hear from [place] is negative and your symptoms could be related to another virus. - Your trichomonas test was positive.  I have sent antibiotics for this.  Started soon as possible.  You are partner needs to be treated for this as well and you both need to abstain from sexual contact until 1 week after you both have completed treatment. - Results of your gonorrhea and Chlamydia testing will be back tomorrow we will call with any positives and recommend further treatment from there. - If you have worsening abdominal pain or running fever you should go to the emergency department for further work-up.  I did send Zofran for your symptoms as well.     ED Prescriptions     Medication Sig Dispense Auth. Provider   ondansetron (ZOFRAN-ODT) 4 MG disintegrating tablet Take 1 tablet (4 mg total) by mouth every 6 (six) hours as needed for nausea or vomiting. 15 tablet Laurene Footman B, PA-C   metroNIDAZOLE (FLAGYL) 500 MG tablet Take  1 tablet (500 mg total) by mouth 2 (two) times daily for 7 days. 14 tablet Gretta Cool      PDMP not reviewed this encounter.

## 2022-02-07 NOTE — ED Triage Notes (Signed)
Pt presents with vomiting and abdominal pain that started today. Pt unable to keep anything down.

## 2022-02-09 LAB — CYTOLOGY, (ORAL, ANAL, URETHRAL) ANCILLARY ONLY
Chlamydia: NEGATIVE
Comment: NEGATIVE
Comment: NORMAL
Neisseria Gonorrhea: NEGATIVE

## 2022-02-15 ENCOUNTER — Ambulatory Visit
Admission: EM | Admit: 2022-02-15 | Discharge: 2022-02-15 | Disposition: A | Discharge status: Home / Self Care | Payer: Medicaid Other | Attending: Physician Assistant | Admitting: Physician Assistant

## 2022-02-15 ENCOUNTER — Ambulatory Visit (INDEPENDENT_AMBULATORY_CARE_PROVIDER_SITE_OTHER): Payer: Medicaid Other

## 2022-02-15 ENCOUNTER — Encounter: Payer: Self-pay | Admitting: Emergency Medicine

## 2022-02-15 DIAGNOSIS — J441 Chronic obstructive pulmonary disease with (acute) exacerbation: Secondary | ICD-10-CM

## 2022-02-15 DIAGNOSIS — R079 Chest pain, unspecified: Secondary | ICD-10-CM

## 2022-02-15 DIAGNOSIS — R0600 Dyspnea, unspecified: Secondary | ICD-10-CM | POA: Diagnosis not present

## 2022-02-15 MED ORDER — METHYLPREDNISOLONE SODIUM SUCC 40 MG IJ SOLR: 80.0000 mg | Freq: Once | INTRAMUSCULAR | Status: DC

## 2022-02-15 MED ORDER — IPRATROPIUM-ALBUTEROL 0.5-2.5 (3) MG/3ML IN SOLN: 3.0000 mL | Freq: Four times a day (QID) | RESPIRATORY_TRACT | 0 refills | Status: AC | PRN

## 2022-02-15 MED ORDER — DOXYCYCLINE HYCLATE 100 MG PO CAPS: 100.0000 mg | ORAL_CAPSULE | Freq: Two times a day (BID) | ORAL | 0 refills | Status: DC

## 2022-02-15 MED ORDER — IPRATROPIUM-ALBUTEROL 0.5-2.5 (3) MG/3ML IN SOLN
3.0000 mL | Freq: Once | RESPIRATORY_TRACT | Status: AC
  Administered 2022-02-15: 3 mL via RESPIRATORY_TRACT

## 2022-02-15 MED ORDER — BUDESONIDE-FORMOTEROL FUMARATE 80-4.5 MCG/ACT IN AERO: 2.0000 | INHALATION_SPRAY | Freq: Two times a day (BID) | RESPIRATORY_TRACT | 2 refills | Status: DC

## 2022-02-15 MED ORDER — METHYLPREDNISOLONE SODIUM SUCC 125 MG IJ SOLR
80.0000 mg | Freq: Once | INTRAMUSCULAR | Status: AC
  Administered 2022-02-15: 80 mg via INTRAVENOUS

## 2022-02-15 MED ORDER — PREDNISONE 10 MG (21) PO TBPK: ORAL_TABLET | Freq: Every day | ORAL | 0 refills | Status: DC

## 2022-02-15 NOTE — ED Provider Notes (Signed)
[facility]    CSN: [id number] Arrival date & time: 02/15/22  1744      History   Chief Complaint Chief Complaint  Patient presents with   Chest Pain    HPI Jacqueline Cooke is a 41 y.o. female.   Patient presents today with a several day history of worsening shortness of breath and wheezing.  She has a history of COPD and asthma managed with albuterol as needed.  Reports she has been using this medication regularly without improvement of symptoms.  She reports occasional lightheadedness as well as chest pain with coughing or deep breathing.  She denies any fever, nausea, vomiting, diaphoresis, malaise.  She has not tried any over-the-counter medication for symptom management.  Denies any recent illness.  Does report she had a prednisone dose approximately 2 weeks ago.  She is not on any maintenance medication for COPD or asthma.  She does not smoke.  Denies any recent antibiotics.  Does have a history of pneumonia requiring ventilation and trach.    Past Medical History:  Diagnosis Date   Anemia    Asthma    COPD (chronic obstructive pulmonary disease) (HCC)    Depression    Hyperphosphatemia    Hypertension    Leukocytosis    Substance abuse (HCC)    History of Cocaine abuse   Tachycardia     Patient Active Problem List   Diagnosis Date Noted   Bipolar depression (HCC) 08/25/2020   Moderate persistent asthma without complication 09/25/2018   Migraine without aura and without status migrainosus, not intractable 01/28/2018   Tobacco abuse 01/28/2018   History of tracheostomy 12/20/2013   H/O: substance abuse (HCC) 08/24/2012   Tachycardia 12/09/2010   SOB (shortness of breath) 12/09/2010    Past Surgical History:  Procedure Laterality Date   CESAREAN SECTION  01/04/2013   DILATION AND CURETTAGE OF UTERUS  2012   [facility] &  2013 [facility]   TRACHEOSTOMY  10/2010    OB History     Gravida  6   Para  2   Term  2   Preterm      AB   4   Living  2      SAB  4   IAB      Ectopic      Multiple      Live Births  2            Home Medications    Prior to Admission medications   Medication Sig Start Date End Date Taking? Authorizing Provider  budesonide-formoterol (SYMBICORT) 80-4.5 MCG/ACT inhaler Inhale 2 puffs into the lungs in the morning and at bedtime. 02/15/22  Yes Spence Soberano K, PA-C  doxycycline (VIBRAMYCIN) 100 MG capsule Take 1 capsule (100 mg total) by mouth 2 (two) times daily. 02/15/22  Yes Exzavier Ruderman K, PA-C  ipratropium-albuterol (DUONEB) 0.5-2.5 (3) MG/3ML SOLN Take 3 mLs by nebulization every 6 (six) hours as needed. 02/15/22  Yes Creg Gilmer K, PA-C  predniSONE (STERAPRED UNI-PAK 21 TAB) 10 MG (21) TBPK tablet Take by mouth daily. Take 6 tabs by mouth daily  for 2 days, then 5 tabs for 2 days, then 4 tabs for 2 days, then 3 tabs for 2 days, 2 tabs for 2 days, then 1 tab by mouth daily for 2 days 02/15/22  Yes Darlyn Repsher K, PA-C  albuterol (PROVENTIL,VENTOLIN) 90 MCG/ACT inhaler Inhale 2 puffs into the lungs every 6 (six) hours as needed for  wheezing. 12/09/10 12/04/11  Minna Merritts, MD  albuterol (VENTOLIN HFA) 108 (90 Base) MCG/ACT inhaler Inhale 2-4 puffs by mouth every 4 hours as needed for wheezing, cough, and/or shortness of breath 12/27/20   Hinda Kehr, MD  albuterol (VENTOLIN HFA) 108 (90 Base) MCG/ACT inhaler Inhale 2 puffs into the lungs every 6 (six) hours as needed for wheezing or shortness of breath. 07/27/21   Lavonia Drafts, MD  benzonatate (TESSALON PERLES) 100 MG capsule Take 1 capsule (100 mg total) by mouth 3 (three) times daily as needed for cough. 12/27/20   Hinda Kehr, MD  chlorpheniramine-HYDROcodone [facility] PENNKINETIC ER) 10-8 MG/5ML SUER Take 5 mLs by mouth every 12 (twelve) hours as needed for cough. 12/27/20   Hinda Kehr, MD  hydrOXYzine (ATARAX/VISTARIL) 25 MG tablet hydroxyzine HCl 25 mg tablet  TAKE 1 TO 2 TABLETS BY MOUTH EVERY DAY AT BEDTIME AS  NEEDED    [provider]  lidocaine (LIDODERM) 5 % Place 1 patch onto the skin every 12 (twelve) hours. Remove & Discard patch within 12 hours or as directed by MD 09/26/21 09/26/22  Vladimir Crofts, MD  magic mouthwash w/lidocaine SOLN Take 5 mLs by mouth 4 (four) times daily as needed for mouth pain. Swish and spit, do not swallow the solution. 12/27/20   Hinda Kehr, MD  mirtazapine (REMERON) 15 MG tablet mirtazapine 15 mg tablet  TAKE 1 TABLET BY MOUTH EVERY DAY AT BEDTIME    [provider]  naproxen (NAPROSYN) 500 MG tablet Take 500 mg by mouth 2 (two) times daily as needed. 08/03/20   [provider]  norethindrone (MICRONOR) 0.35 MG tablet Take 1 tablet (0.35 mg total) by mouth daily. 08/25/20   Rod Can, CNM  ondansetron (ZOFRAN-ODT) 4 MG disintegrating tablet Take 1 tablet (4 mg total) by mouth every 6 (six) hours as needed for nausea or vomiting. 02/07/22   Danton Clap, PA-C  traMADol (ULTRAM) 50 MG tablet Take 1 tablet (50 mg total) by mouth every 6 (six) hours as needed. 12/05/21   Fisher, Linden Dolin, PA-C  valACYclovir (VALTREX) 1000 MG tablet Take 1,000 mg by mouth 3 (three) times daily. 07/01/20   [provider]    Family History Family History  Family history unknown: Yes    Social History Social History   Tobacco Use   Smoking status: Every Day    Packs/day: 0.10    Types: Cigarettes   Smokeless tobacco: Never  Vaping Use   Vaping Use: Never used  Substance Use Topics   Alcohol use: Yes    Comment: social   Drug use: Not Currently    Types: "Crack" cocaine, Marijuana    Comment: History of Marijuana, Cocaine some days     Allergies   Penicillins cross reactors and Penicillins   Review of Systems Review of Systems  Constitutional:  Positive for activity change. Negative for appetite change, fatigue and fever.  HENT:  Negative for congestion, sinus pressure, sneezing and sore throat.   Respiratory:  Positive for cough, chest  tightness, shortness of breath and wheezing.   Cardiovascular:  Positive for chest pain.  Gastrointestinal:  Negative for abdominal pain, diarrhea, nausea and vomiting.     Physical Exam Triage Vital Signs ED Triage Vitals  Enc Vitals Group     BP 02/15/22 1822 (!) 125/97     Pulse Rate 02/15/22 1822 77     Resp 02/15/22 1822 18     Temp 02/15/22 1822 98.8 F (37.1 C)  Temp Source 02/15/22 1822 Oral     SpO2 02/15/22 1822 100 %     Weight --      Height --      Head Circumference --      Peak Flow --      Pain Score 02/15/22 1821 10     Pain Loc --      Pain Edu? --      Excl. in [place]? --    No data found.  Updated Vital Signs BP (!) 125/97 (BP Location: Left Arm)   Pulse 77   Temp 98.8 F (37.1 C) (Oral)   Resp 18   LMP  (LMP Unknown)   SpO2 100%   Visual Acuity Right Eye Distance:   Left Eye Distance:   Bilateral Distance:    Right Eye Near:   Left Eye Near:    Bilateral Near:     Physical Exam Vitals reviewed.  Constitutional:      General: She is awake. She is not in acute distress.    Appearance: Normal appearance. She is well-developed. She is not ill-appearing.     Comments: Very pleasant female appears stated age in no acute distress sitting comfortably in exam room   HENT:     Head: Normocephalic and atraumatic.     Mouth/Throat:     Pharynx: Uvula midline. No oropharyngeal exudate or posterior oropharyngeal erythema.  Cardiovascular:     Rate and Rhythm: Normal rate and regular rhythm.     Heart sounds: Normal heart sounds, S1 normal and S2 normal. No murmur heard. Pulmonary:     Effort: Pulmonary effort is normal.     Breath sounds: Wheezing and rhonchi present. No rales.     Comments: Widespread wheezing rhonchi Psychiatric:        Behavior: Behavior is cooperative.      UC Treatments / Results  Labs (all labs ordered are listed, but only abnormal results are displayed) Labs Reviewed - No data to display  EKG   Radiology DG  Chest 2 View  Result Date: 02/15/2022 CLINICAL DATA:  Dyspnea, chest pain EXAM: CHEST - 2 VIEW COMPARISON:  09/26/2021 FINDINGS: The heart size and mediastinal contours are within normal limits. Both lungs are clear. The visualized skeletal structures are unremarkable. IMPRESSION: No active cardiopulmonary disease. Electronically Signed   By: Helyn Numbers M.D.   On: 02/15/2022 18:48    Procedures Procedures (including critical care time)  Medications Ordered in UC Medications  ipratropium-albuterol (DUONEB) 0.5-2.5 (3) MG/3ML nebulizer solution 3 mL (3 mLs Nebulization Given 02/15/22 1930)  methylPREDNISolone sodium succinate (SOLU-MEDROL) 125 mg/2 mL injection 80 mg (80 mg Intravenous Given 02/15/22 1931)    Initial Impression / Assessment and Plan / UC Course  I have reviewed the triage vital signs and the nursing notes.  Pertinent labs & imaging results that were available during my care of the patient were reviewed by me and considered in my medical decision making (see chart for details).     EKG obtained showed normal sinus rhythm ventricular rate of 71 bpm without ischemic changes; compared to 12/28/2020 tracing no significant change.  X-ray was obtained that showed no acute cardiopulmonary disease.  Concern for COPD/asthma exacerbation.  She was given DuoNeb and 80 mg of Solu-Medrol in clinic.  Reports significant improvement of symptoms with medication including.  She did request a nebulizer as she finds this to be more effective.  She was given a nebulizer in clinic and DuoNebs were sent to  pharmacy.  We will start prednisone taper for 2 weeks and she was instructed not to take NSAIDs with this medication.  She was started on doxycycline for COPD exacerbation.  Discussed that she will need a maintenance medication given recurrent symptoms and was started on Symbicort which was sent to her pharmacy.  Discussed that she will need to rinse her mouth following use of this medication to  prevent thrush.  Recommend close follow-up with her primary care.  Discussed that if she has any worsening symptoms she needs to go to the emergency room immediately.  Final Clinical Impressions(s) / UC Diagnoses   Final diagnoses:  COPD exacerbation (HCC)     Discharge Instructions      I am concerned that you have a COPD exacerbation.  Start doxycycline to cover for infection.  Stay out of sun while this medication.  Start prednisone taper.  Do not take NSAIDs including aspirin, ibuprofen/Advil, naproxen/Aleve while on this medication as it can cause stomach bleeding.  Use Symbicort twice daily.  I have called in DuoNebs to help with your breathing.  Use these every 4-6 hours as needed.  These are in place of your albuterol inhaler.  Use your albuterol inhaler if you are out and about.  Rinse your mouth following use of this medication to prevent thrush.  Follow-up closely with your primary care.  If your symptoms are not improving or if anything worsens you need to go to the emergency room.     ED Prescriptions     Medication Sig Dispense Auth. Provider   predniSONE (STERAPRED UNI-PAK 21 TAB) 10 MG (21) TBPK tablet Take by mouth daily. Take 6 tabs by mouth daily  for 2 days, then 5 tabs for 2 days, then 4 tabs for 2 days, then 3 tabs for 2 days, 2 tabs for 2 days, then 1 tab by mouth daily for 2 days 42 tablet Neha Waight K, PA-C   budesonide-formoterol (SYMBICORT) 80-4.5 MCG/ACT inhaler Inhale 2 puffs into the lungs in the morning and at bedtime. 1 each Mikhala Kenan K, PA-C   doxycycline (VIBRAMYCIN) 100 MG capsule Take 1 capsule (100 mg total) by mouth 2 (two) times daily. 20 capsule Emmaleah Meroney K, PA-C   ipratropium-albuterol (DUONEB) 0.5-2.5 (3) MG/3ML SOLN Take 3 mLs by nebulization every 6 (six) hours as needed. 90 mL Pinchos Topel K, PA-C      PDMP not reviewed this encounter.   Jeani Hawking, PA-C 02/15/22 1954

## 2022-02-15 NOTE — Discharge Instructions (Addendum)
I am concerned that you have a COPD exacerbation.  Start doxycycline to cover for infection.  Stay out of sun while this medication.  Start prednisone taper.  Do not take NSAIDs including aspirin, ibuprofen/Advil, naproxen/Aleve while on this medication as it can cause stomach bleeding.  Use Symbicort twice daily.  I have called in DuoNebs to help with your breathing.  Use these every 4-6 hours as needed.  These are in place of your albuterol inhaler.  Use your albuterol inhaler if you are out and about.  Rinse your mouth following use of this medication to prevent thrush.  Follow-up closely with your primary care.  If your symptoms are not improving or if anything worsens you need to go to the emergency room.

## 2022-02-15 NOTE — ED Triage Notes (Signed)
Pt presents with chest pain that started last night. The pain is worse when she takes a deep breath in. She tried using her inhaler for relief but it just makes her feel light headed.

## 2022-04-09 ENCOUNTER — Other Ambulatory Visit: Payer: Self-pay

## 2022-04-09 ENCOUNTER — Emergency Department
Admission: EM | Admit: 2022-04-09 | Discharge: 2022-04-09 | Disposition: A | Discharge status: Home / Self Care | Payer: Medicaid Other | Attending: Emergency Medicine | Admitting: Emergency Medicine

## 2022-04-09 ENCOUNTER — Emergency Department: Payer: Medicaid Other

## 2022-04-09 DIAGNOSIS — Z20822 Contact with and (suspected) exposure to covid-19: Secondary | ICD-10-CM | POA: Insufficient documentation

## 2022-04-09 DIAGNOSIS — F17210 Nicotine dependence, cigarettes, uncomplicated: Secondary | ICD-10-CM | POA: Diagnosis not present

## 2022-04-09 DIAGNOSIS — J069 Acute upper respiratory infection, unspecified: Secondary | ICD-10-CM | POA: Diagnosis not present

## 2022-04-09 DIAGNOSIS — J449 Chronic obstructive pulmonary disease, unspecified: Secondary | ICD-10-CM | POA: Diagnosis not present

## 2022-04-09 DIAGNOSIS — R229 Localized swelling, mass and lump, unspecified: Secondary | ICD-10-CM | POA: Diagnosis not present

## 2022-04-09 DIAGNOSIS — R519 Headache, unspecified: Secondary | ICD-10-CM | POA: Diagnosis present

## 2022-04-09 DIAGNOSIS — I10 Essential (primary) hypertension: Secondary | ICD-10-CM | POA: Insufficient documentation

## 2022-04-09 DIAGNOSIS — J45909 Unspecified asthma, uncomplicated: Secondary | ICD-10-CM | POA: Insufficient documentation

## 2022-04-09 DIAGNOSIS — R059 Cough, unspecified: Secondary | ICD-10-CM | POA: Diagnosis not present

## 2022-04-09 LAB — CBC WITH DIFFERENTIAL/PLATELET
Abs Immature Granulocytes: 0.03 10*3/uL (ref 0.00–0.07)
Basophils Absolute: 0.1 10*3/uL (ref 0.0–0.1)
Basophils Relative: 1 %
Eosinophils Absolute: 0.2 10*3/uL (ref 0.0–0.5)
Eosinophils Relative: 2 %
HCT: 39.1 % (ref 36.0–46.0)
Hemoglobin: 13.1 g/dL (ref 12.0–15.0)
Immature Granulocytes: 0 %
Lymphocytes Relative: 32 %
Lymphs Abs: 2.7 10*3/uL (ref 0.7–4.0)
MCH: 32.5 pg (ref 26.0–34.0)
MCHC: 33.5 g/dL (ref 30.0–36.0)
MCV: 97 fL (ref 80.0–100.0)
Monocytes Absolute: 0.6 10*3/uL (ref 0.1–1.0)
Monocytes Relative: 7 %
Neutro Abs: 5 10*3/uL (ref 1.7–7.7)
Neutrophils Relative %: 58 %
Platelets: 268 10*3/uL (ref 150–400)
RBC: 4.03 MIL/uL (ref 3.87–5.11)
RDW: 12 % (ref 11.5–15.5)
WBC: 8.5 10*3/uL (ref 4.0–10.5)
nRBC: 0 % (ref 0.0–0.2)

## 2022-04-09 LAB — GROUP A STREP BY PCR: Group A Strep by PCR: NOT DETECTED

## 2022-04-09 LAB — BASIC METABOLIC PANEL
Anion gap: 4 — ABNORMAL LOW (ref 5–15)
BUN: 13 mg/dL (ref 6–20)
CO2: 26 mmol/L (ref 22–32)
Calcium: 8.6 mg/dL — ABNORMAL LOW (ref 8.9–10.3)
Chloride: 110 mmol/L (ref 98–111)
Creatinine, Ser: 0.8 mg/dL (ref 0.44–1.00)
GFR, Estimated: >60 mL/min (ref >60)
Glucose, Bld: 101 mg/dL — ABNORMAL HIGH (ref 70–99)
Potassium: 4 mmol/L (ref 3.5–5.1)
Sodium: 140 mmol/L (ref 135–145)

## 2022-04-09 LAB — RESP PANEL BY RT-PCR (RSV, FLU A&B, COVID)  RVPGX2
Influenza A by PCR: NEGATIVE
Influenza B by PCR: NEGATIVE
Resp Syncytial Virus by PCR: NEGATIVE
SARS Coronavirus 2 by RT PCR: NEGATIVE

## 2022-04-09 MED ORDER — KETOROLAC TROMETHAMINE 30 MG/ML IJ SOLN
30.0000 mg | Freq: Once | INTRAMUSCULAR | Status: AC
  Administered 2022-04-09: 30 mg via INTRAVENOUS
  Filled 2022-04-09: qty 1

## 2022-04-09 MED ORDER — IOHEXOL 300 MG/ML  SOLN
75.0000 mL | Freq: Once | INTRAMUSCULAR | Status: AC | PRN
  Administered 2022-04-09: 75 mL via INTRAVENOUS

## 2022-04-09 NOTE — ED Provider Triage Note (Signed)
Emergency Medicine Provider Triage Evaluation Note  Jacqueline Cooke , a 41 y.o. female  was evaluated in triage.  Pt complains of nasal congestion, headache, chills, cough. HX COPD but symptoms are different. Visited niece 2 days ago who tested positive for RSV. No CP, ShOB, emesis, fever.  Review of Systems  Positive: Cough, HA, chills, nasal congestion Negative: Fever, shob, CP  Physical Exam  BP 112/80   Pulse 97   Temp 98 F (36.7 C) (Oral)   Resp 18   LMP 04/06/2022   SpO2 100%  Gen:   Awake, no distress   Resp:  Normal effort  MSK:   Moves extremities without difficulty  Other:    Medical Decision Making  Medically screening exam initiated at 6:00 PM.  Appropriate orders placed.  Jacqueline Cooke was informed that the remainder of the evaluation will be completed by another provider, this initial triage assessment does not replace that evaluation, and the importance of remaining in the ED until their evaluation is complete.  Chest xray, swab at this time   Racheal Patches, PA-C 04/09/22 1800

## 2022-04-09 NOTE — ED Provider Notes (Signed)
[facility] Provider Note    Event Date/Time   First MD Initiated Contact with Patient 04/09/22 1807     (approximate)   History   Headache   HPI  Jacqueline Cooke is a 41 y.o. female presents emergency department complaining of congestion, headache, chills cough.  History of COPD.  Symptoms for 1 to 2 days.  Had a family member visit that was positive for RSV.  States she also feels like the front of her neck is a little swollen where she used to have a trach years ago.  Denies chest pain, shortness of breath or fever      Physical Exam   Triage Vital Signs: ED Triage Vitals  Enc Vitals Group     BP 04/09/22 1757 112/80     Pulse Rate 04/09/22 1757 97     Resp 04/09/22 1757 18     Temp 04/09/22 1757 98 F (36.7 C)     Temp Source 04/09/22 1757 Oral     SpO2 04/09/22 1757 100 %     Weight --      Height --      Head Circumference --      Peak Flow --      Pain Score 04/09/22 1758 10     Pain Loc --      Pain Edu? --      Excl. in [place]? --     Most recent vital signs: Vitals:   04/09/22 1757  BP: 112/80  Pulse: 97  Resp: 18  Temp: 98 F (36.7 C)  SpO2: 100%     General: Awake, no distress.   CV:  Good peripheral perfusion. regular rate and  rhythm Resp:  Normal effort. Lungs CTA Abd:  No distention.   Other:  Neck is supple, tender along the left tonsillar gland, some swelling noted anteriorly along the previous trach scar, no crepitus was noted   ED Results / Procedures / Treatments   Labs (all labs ordered are listed, but only abnormal results are displayed) Labs Reviewed  RESP PANEL BY RT-PCR (RSV, FLU A&B, COVID)  [id number]  GROUP A STREP BY PCR  CBC WITH DIFFERENTIAL/PLATELET  BASIC METABOLIC PANEL     EKG     RADIOLOGY Chest x-ray, soft tissue of the neck    PROCEDURES:   Procedures   MEDICATIONS ORDERED IN ED: Medications  ketorolac (TORADOL) 30 MG/ML injection 30 mg (30 mg Intravenous Given  04/09/22 1936)     IMPRESSION / MDM / ASSESSMENT AND PLAN / ED COURSE  I reviewed the triage vital signs and the nursing notes.                              Differential diagnosis includes, but is not limited to, COVID, influenza, strep throat, free air in the airway, complication from previous trach  Patient's presentation is most consistent with acute complicated illness / injury requiring diagnostic workup.   Chest x-ray independently reviewed and interpreted by me as being negative for any acute abnormality.  Confirmed by radiology  Respiratory panel, strep swab, soft tissue of the neck to determine if there is free air   , Strep test and Respiratory panel reassuring  X-ray soft tissue of the neck does show a fair amount of soft tissue swelling, will order CT soft tissue of the neck, labs  Care being transferred to Galen Daft, FNP at shift change.  Plan is to await the CT results.  Feel patient will be stable for discharge unless the CT shows something pertinent.  Diagnosis at this time is URI  FINAL CLINICAL IMPRESSION(S) / ED DIAGNOSES   Final diagnoses:  Acute URI  Soft tissue swelling     Rx / DC Orders   ED Discharge Orders     None        Note:  This document was prepared using Dragon voice recognition software and may include unintentional dictation errors.    Faythe Ghee, PA-C 04/09/22 Graylon Gunning    Shaune Pollack, MD 04/17/22 [phone]

## 2022-04-09 NOTE — ED Provider Notes (Signed)
[facility] Emergency Department Provider Note   ____________________________________________   Event Date/Time   First MD Initiated Contact with Patient 04/09/22 1807     (approximate)  I have reviewed the triage vital signs and the nursing notes.   HISTORY  Chief Complaint Headache    HPI Jacqueline Cooke is a 41 y.o. female resenting to the emergency room for complaint of congestion/headache/chills/cough.  She was worked up by previous shift and found to have negative strep/negative respiratory panel.  However her x-ray of soft tissue of the neck shows some fair amount of soft tissue swelling.  Therefore, a CT of the soft tissue of the neck was ordered as well as labs. I received report from previous shift and I am awaiting CT results.  If CT results are normal patient will be discharged home with diagnosis of acute upper respiratory infection.   Past Medical History:  Diagnosis Date   Anemia    Asthma    COPD (chronic obstructive pulmonary disease) (HCC)    Depression    Hyperphosphatemia    Hypertension    Leukocytosis    Substance abuse (HCC)    History of Cocaine abuse   Tachycardia     Patient Active Problem List   Diagnosis Date Noted   Bipolar depression (HCC) 08/25/2020   Moderate persistent asthma without complication 09/25/2018   Migraine without aura and without status migrainosus, not intractable 01/28/2018   Tobacco abuse 01/28/2018   History of tracheostomy 12/20/2013   H/O: substance abuse (HCC) 08/24/2012   Tachycardia 12/09/2010   SOB (shortness of breath) 12/09/2010    Past Surgical History:  Procedure Laterality Date   CESAREAN SECTION  01/04/2013   DILATION AND CURETTAGE OF UTERUS  2012   [facility]  2013 [facility] [place]   TRACHEOSTOMY  10/2010    Prior to Admission medications   Medication Sig Start Date End Date Taking? Authorizing Provider  albuterol (PROVENTIL,VENTOLIN) 90 MCG/ACT inhaler Inhale 2 puffs  into the lungs every 6 (six) hours as needed for wheezing. 12/09/10 12/04/11  Antonieta Iba, MD  albuterol (VENTOLIN HFA) 108 (90 Base) MCG/ACT inhaler Inhale 2-4 puffs by mouth every 4 hours as needed for wheezing, cough, and/or shortness of breath 12/27/20   Loleta Rose, MD  albuterol (VENTOLIN HFA) 108 (90 Base) MCG/ACT inhaler Inhale 2 puffs into the lungs every 6 (six) hours as needed for wheezing or shortness of breath. 07/27/21   Jene Every, MD  benzonatate (TESSALON PERLES) 100 MG capsule Take 1 capsule (100 mg total) by mouth 3 (three) times daily as needed for cough. 12/27/20   Loleta Rose, MD  budesonide-formoterol [facility]) 80-4.5 MCG/ACT inhaler Inhale 2 puffs into the lungs in the morning and at bedtime. 02/15/22   Raspet, Noberto Retort, PA-C  chlorpheniramine-HYDROcodone (TUSSIONEX PENNKINETIC ER) 10-8 MG/5ML SUER Take 5 mLs by mouth every 12 (twelve) hours as needed for cough. 12/27/20   Loleta Rose, MD  doxycycline (VIBRAMYCIN) 100 MG capsule Take 1 capsule (100 mg total) by mouth 2 (two) times daily. 02/15/22   Raspet, Noberto Retort, PA-C  hydrOXYzine (ATARAX/VISTARIL) 25 MG tablet hydroxyzine HCl 25 mg tablet  TAKE 1 TO 2 TABLETS BY MOUTH EVERY DAY AT BEDTIME AS NEEDED    [provider]  ipratropium-albuterol (DUONEB) 0.5-2.5 (3) MG/3ML SOLN Take 3 mLs by nebulization every 6 (six) hours as needed. 02/15/22   Raspet, Erin K, PA-C  lidocaine (LIDODERM) 5 % Place 1 patch onto the skin every  12 (twelve) hours. Remove & Discard patch within 12 hours or as directed by MD 09/26/21 09/26/22  Vladimir Crofts, MD  magic mouthwash w/lidocaine SOLN Take 5 mLs by mouth 4 (four) times daily as needed for mouth pain. Swish and spit, do not swallow the solution. 12/27/20   Hinda Kehr, MD  mirtazapine (REMERON) 15 MG tablet mirtazapine 15 mg tablet  TAKE 1 TABLET BY MOUTH EVERY DAY AT BEDTIME    [provider]  naproxen (NAPROSYN) 500 MG tablet Take 500 mg by mouth 2 (two) times daily as  needed. 08/03/20   [provider]  norethindrone (MICRONOR) 0.35 MG tablet Take 1 tablet (0.35 mg total) by mouth daily. 08/25/20   Rod Can, CNM  ondansetron (ZOFRAN-ODT) 4 MG disintegrating tablet Take 1 tablet (4 mg total) by mouth every 6 (six) hours as needed for nausea or vomiting. 02/07/22   Danton Clap, PA-C  predniSONE (STERAPRED UNI-PAK 21 TAB) 10 MG (21) TBPK tablet Take by mouth daily. Take 6 tabs by mouth daily  for 2 days, then 5 tabs for 2 days, then 4 tabs for 2 days, then 3 tabs for 2 days, 2 tabs for 2 days, then 1 tab by mouth daily for 2 days 02/15/22   Raspet, Erin K, PA-C  traMADol (ULTRAM) 50 MG tablet Take 1 tablet (50 mg total) by mouth every 6 (six) hours as needed. 12/05/21   Fisher, Linden Dolin, PA-C  valACYclovir (VALTREX) 1000 MG tablet Take 1,000 mg by mouth 3 (three) times daily. 07/01/20   [provider]    Allergies Penicillins cross reactors and Penicillins  Family History  Family history unknown: Yes    Social History Social History   Tobacco Use   Smoking status: Every Day    Packs/day: 0.10    Types: Cigarettes   Smokeless tobacco: Never  Vaping Use   Vaping Use: Never used  Substance Use Topics   Alcohol use: Yes    Comment: social   Drug use: Not Currently    Types: "Crack" cocaine, Marijuana    Comment: History of Marijuana, Cocaine some days    Review of Systems  Constitutional: No fever/chills Eyes: No visual changes. ENT: Positive for sore throat Cardiovascular: Denies chest pain. Respiratory: Positive for cough Gastrointestinal: No abdominal pain.  No nausea, no vomiting.  No diarrhea.  No constipation. Genitourinary: Negative for dysuria. Musculoskeletal: Negative for back pain. Skin: Negative for rash. Neurological: Positive for headache   ____________________________________________   PHYSICAL EXAM:  VITAL SIGNS: ED Triage Vitals  Enc Vitals Group     BP 04/09/22 1757 112/80     Pulse Rate  04/09/22 1757 97     Resp 04/09/22 1757 18     Temp 04/09/22 1757 98 F (36.7 C)     Temp Source 04/09/22 1757 Oral     SpO2 04/09/22 1757 100 %     Weight --      Height --      Head Circumference --      Peak Flow --      Pain Score 04/09/22 1758 10     Pain Loc --      Pain Edu? --      Excl. in [place]? --     Constitutional: Alert and oriented. Well appearing and in no acute distress. Eyes: Conjunctivae are normal. PERRL. EOMI. Head: Atraumatic. Nose: No congestion/rhinnorhea. Mouth/Throat: Mucous membranes are moist.  Oropharynx non-erythematous. Neck: No stridor.   Cardiovascular: Normal rate,  regular rhythm. Grossly normal heart sounds.  Good peripheral circulation. Respiratory: Normal respiratory effort.  No retractions. Lungs CTAB. Gastrointestinal: Soft and nontender. No distention. No abdominal bruits. No CVA tenderness. Musculoskeletal: No lower extremity tenderness nor edema.  No joint effusions. Neurologic:  Normal speech and language. No gross focal neurologic deficits are appreciated. No gait instability. Skin:  Skin is warm, dry and intact. No rash noted. Psychiatric: Mood and affect are normal. Speech and behavior are normal.  ____________________________________________   LABS (all labs ordered are listed, but only abnormal results are displayed)  Labs Reviewed  BASIC METABOLIC PANEL - Abnormal; Notable for the following components:      Result Value   Glucose, Bld 101 (*)    Calcium 8.6 (*)    Anion gap 4 (*)    All other components within normal limits  RESP PANEL BY RT-PCR (RSV, FLU A&B, COVID)  RVPGX2  GROUP A STREP BY PCR  CBC WITH DIFFERENTIAL/PLATELET   ____________________________________________  EKG   ____________________________________________  RADIOLOGY  ED MD interpretation: CT of neck was reviewed by me and read by radiologist.  Official radiology report(s): CT Soft Tissue Neck W Contrast  Result Date: 04/09/2022 CLINICAL DATA:   Headache, chills, and cough, soft tissue infection suspected EXAM: CT NECK WITH CONTRAST TECHNIQUE: Multidetector CT imaging of the neck was performed using the standard protocol following the bolus administration of intravenous contrast. RADIATION DOSE REDUCTION: This exam was performed according to the departmental dose-optimization program which includes automated exposure control, adjustment of the mA and/or kV according to patient size and/or use of iterative reconstruction technique. CONTRAST:  76mL OMNIPAQUE IOHEXOL 300 MG/ML  SOLN COMPARISON:  None Available. FINDINGS: Pharynx and larynx: Normal. No mass or swelling. Salivary glands: No inflammation, mass, or stone. Thyroid: Normal. Lymph nodes: Prominent cervical lymph nodes, which retain normal morphology. For example, a left level 2B lymph node that measures up to 8 mm in short axis (series 2, image 30), a right level 2A lymph node that measures up to 10 mm in short axis (image 107) and a left level 1B lymph node that measures up to 8 mm in short axis (series 2, image 46). No suppurative lymphadenopathy. Vascular: Patent. Limited intracranial: Negative. Visualized orbits: Negative. Mastoids and visualized paranasal sinuses: Overall clear paranasal sinuses. The mastoids are well aerated. Skeleton: No acute osseous abnormality. Upper chest: No focal pulmonary opacity or pleural effusion. Subpleural lucencies, possibly blebs. Other: None. IMPRESSION: 1. Prominent cervical lymph nodes, which retain normal morphology, likely reactive. No suppurative lymphadenopathy. 2. No definite acute process in the neck. Electronically Signed   By: Wiliam Ke M.D.   On: 04/09/2022 21:53   DG Neck Soft Tissue  Result Date: 04/09/2022 CLINICAL DATA:  Swelling in tracheostomy EXAM: NECK SOFT TISSUES - 1+ VIEW COMPARISON:  None Available. FINDINGS: The hypopharynx is not distended. Epiglottis and aryepiglottic folds are normal. Subglottic airway is widely patent. There  is subglottic soft tissue swelling noted anteriorly in the region of the expected tracheostomy. Retropharyngeal soft tissues are not thickened. Limited evaluation of the cervical spine is unremarkable. IMPRESSION: 1. Subglottic soft tissue swelling in the region of the expected tracheostomy. Electronically Signed   By: Helyn Numbers M.D.   On: 04/09/2022 19:01   DG Chest 2 View  Result Date: 04/09/2022 CLINICAL DATA:  Cough, headache, congestion EXAM: CHEST - 2 VIEW COMPARISON:  02/15/2022 FINDINGS: Frontal and lateral views of the chest demonstrate an unremarkable cardiac silhouette. No acute airspace disease, effusion,  or pneumothorax. No acute bony abnormality. IMPRESSION: 1. Stable chest, no acute process. Electronically Signed   By: Randa Ngo M.D.   On: 04/09/2022 18:15    ____________________________________________   PROCEDURES  Procedure(s) performed: None  Procedures  Critical Care performed: No  ____________________________________________   INITIAL IMPRESSION / ASSESSMENT AND PLAN / ED COURSE     Jacqueline Cooke is a 41 y.o. female resenting to the emergency room for complaint of congestion/headache/chills/cough.  She was worked up by previous shift and found to have negative strep/negative respiratory panel.  However her x-ray of soft tissue of the neck shows some fair amount of soft tissue swelling.  Therefore, a CT of the soft tissue of the neck was ordered as well as labs. I received report from previous shift and I am awaiting CT results.  If CT results are normal patient will be discharged home with diagnosis of acute upper respiratory infection.   CT of neck shows prominent cervical lymph nodes which retain normal morphology and are most likely reactive in nature.  There is no supratip of lymphadenopathy.  There is no acute process in the neck. Therefore, patient will be discharged home in stable condition at this time. Discussed findings with patient. She  should take over-the-counter medications to treat her symptoms. Patient is in agreement with plan and will be discharged home in stable condition at this time.      ____________________________________________   FINAL CLINICAL IMPRESSION(S) / ED DIAGNOSES  Final diagnoses:  Acute URI  Soft tissue swelling     ED Discharge Orders     None        Note:  This document was prepared using Dragon voice recognition software and may include unintentional dictation errors.     Willaim Rayas, NP 04/09/22 2213    Naaman Plummer, MD 04/09/22 [phone]

## 2022-04-09 NOTE — ED Provider Notes (Incomplete)
[facility] Emergency Department Provider Note   ____________________________________________   Event Date/Time   First MD Initiated Contact with Patient 04/09/22 1807     (approximate)  I have reviewed the triage vital signs and the nursing notes.   HISTORY  Chief Complaint Headache    HPI Jacqueline Cooke is a 41 y.o. female ***    {**SYMPTOM/COMPLAINT  LOCATION (describe anatomically) DURATION (when did it start) TIMING (onset and pattern) SEVERITY (0-10, mild/moderate/severe) QUALITY (description of symptoms) CONTEXT (recent surgery, new meds, activity, etc.) MODIFYINGFACTORS (what makes it better/worse) ASSOCIATEDSYMPTOMS (pertinent positives and negatives)**} Past Medical History:  Diagnosis Date  . Anemia   . Asthma   . COPD (chronic obstructive pulmonary disease) (HCC)   . Depression   . Hyperphosphatemia   . Hypertension   . Leukocytosis   . Substance abuse (HCC)    History of Cocaine abuse  . Tachycardia     Patient Active Problem List   Diagnosis Date Noted  . Bipolar depression (HCC) 08/25/2020  . Moderate persistent asthma without complication 09/25/2018  . Migraine without aura and without status migrainosus, not intractable 01/28/2018  . Tobacco abuse 01/28/2018  . History of tracheostomy 12/20/2013  . H/O: substance abuse (HCC) 08/24/2012  . Tachycardia 12/09/2010  . SOB (shortness of breath) 12/09/2010    Past Surgical History:  Procedure Laterality Date  . CESAREAN SECTION  01/04/2013  . DILATION AND CURETTAGE OF UTERUS  2012   [facility]  2013 [facility]  . TRACHEOSTOMY  10/2010    Prior to Admission medications   Medication Sig Start Date End Date Taking? Authorizing Provider  albuterol (PROVENTIL,VENTOLIN) 90 MCG/ACT inhaler Inhale 2 puffs into the lungs every 6 (six) hours as needed for wheezing. 12/09/10 12/04/11  Antonieta Iba, MD  albuterol (VENTOLIN HFA) 108 (90 Base) MCG/ACT inhaler Inhale  2-4 puffs by mouth every 4 hours as needed for wheezing, cough, and/or shortness of breath 12/27/20   Loleta Rose, MD  albuterol (VENTOLIN HFA) 108 (90 Base) MCG/ACT inhaler Inhale 2 puffs into the lungs every 6 (six) hours as needed for wheezing or shortness of breath. 07/27/21   Jene Every, MD  benzonatate (TESSALON PERLES) 100 MG capsule Take 1 capsule (100 mg total) by mouth 3 (three) times daily as needed for cough. 12/27/20   Loleta Rose, MD  budesonide-formoterol [facility]) 80-4.5 MCG/ACT inhaler Inhale 2 puffs into the lungs in the morning and at bedtime. 02/15/22   Raspet, Noberto Retort, PA-C  chlorpheniramine-HYDROcodone (TUSSIONEX PENNKINETIC ER) 10-8 MG/5ML SUER Take 5 mLs by mouth every 12 (twelve) hours as needed for cough. 12/27/20   Loleta Rose, MD  doxycycline (VIBRAMYCIN) 100 MG capsule Take 1 capsule (100 mg total) by mouth 2 (two) times daily. 02/15/22   Raspet, Noberto Retort, PA-C  hydrOXYzine (ATARAX/VISTARIL) 25 MG tablet hydroxyzine HCl 25 mg tablet  TAKE 1 TO 2 TABLETS BY MOUTH EVERY DAY AT BEDTIME AS NEEDED    [provider]  ipratropium-albuterol (DUONEB) 0.5-2.5 (3) MG/3ML SOLN Take 3 mLs by nebulization every 6 (six) hours as needed. 02/15/22   Raspet, Erin K, PA-C  lidocaine (LIDODERM) 5 % Place 1 patch onto the skin every 12 (twelve) hours. Remove & Discard patch within 12 hours or as directed by MD 09/26/21 09/26/22  Delton Prairie, MD  magic mouthwash w/lidocaine SOLN Take 5 mLs by mouth 4 (four) times daily as needed for mouth pain. Swish and spit, do not swallow the solution. 12/27/20  Loleta Rose, MD  mirtazapine (REMERON) 15 MG tablet mirtazapine 15 mg tablet  TAKE 1 TABLET BY MOUTH EVERY DAY AT BEDTIME    [provider]  naproxen (NAPROSYN) 500 MG tablet Take 500 mg by mouth 2 (two) times daily as needed. 08/03/20   [provider]  norethindrone (MICRONOR) 0.35 MG tablet Take 1 tablet (0.35 mg total) by mouth daily. 08/25/20   Tresea Mall, CNM   ondansetron (ZOFRAN-ODT) 4 MG disintegrating tablet Take 1 tablet (4 mg total) by mouth every 6 (six) hours as needed for nausea or vomiting. 02/07/22   Shirlee Latch, PA-C  predniSONE (STERAPRED UNI-PAK 21 TAB) 10 MG (21) TBPK tablet Take by mouth daily. Take 6 tabs by mouth daily  for 2 days, then 5 tabs for 2 days, then 4 tabs for 2 days, then 3 tabs for 2 days, 2 tabs for 2 days, then 1 tab by mouth daily for 2 days 02/15/22   Raspet, Erin K, PA-C  traMADol (ULTRAM) 50 MG tablet Take 1 tablet (50 mg total) by mouth every 6 (six) hours as needed. 12/05/21   Fisher, Roselyn Bering, PA-C  valACYclovir (VALTREX) 1000 MG tablet Take 1,000 mg by mouth 3 (three) times daily. 07/01/20   [provider]    Allergies Penicillins cross reactors and Penicillins  Family History  Family history unknown: Yes    Social History Social History   Tobacco Use  . Smoking status: Every Day    Packs/day: 0.10    Types: Cigarettes  . Smokeless tobacco: Never  Vaping Use  . Vaping Use: Never used  Substance Use Topics  . Alcohol use: Yes    Comment: social  . Drug use: Not Currently    Types: "Crack" cocaine, Marijuana    Comment: History of Marijuana, Cocaine some days    Review of Systems {** Revise as appropriate then delete this line - Documentation of 10 systems is required  **} Constitutional: No fever/chills Eyes: No visual changes. ENT: No sore throat. Cardiovascular: Denies chest pain. Respiratory: Denies shortness of breath. Gastrointestinal: No abdominal pain.  No nausea, no vomiting.  No diarrhea.  No constipation. Genitourinary: Negative for dysuria. Musculoskeletal: Negative for back pain. Skin: Negative for rash. Neurological: Negative for headaches, focal weakness or numbness. {**Psychiatric:  Endocrine:  Hematological/Lymphatic:  Allergic/Immunilogical: **}  ____________________________________________   PHYSICAL EXAM:  VITAL SIGNS: ED Triage Vitals  Enc Vitals  Group     BP 04/09/22 1757 112/80     Pulse Rate 04/09/22 1757 97     Resp 04/09/22 1757 18     Temp 04/09/22 1757 98 F (36.7 C)     Temp Source 04/09/22 1757 Oral     SpO2 04/09/22 1757 100 %     Weight --      Height --      Head Circumference --      Peak Flow --      Pain Score 04/09/22 1758 10     Pain Loc --      Pain Edu? --      Excl. in [place]? --    {** Revise as appropriate then delete this line - 8 systems required **} Constitutional: Alert and oriented. Well appearing and in no acute distress. Eyes: Conjunctivae are normal. PERRL. EOMI. Head: Atraumatic. Nose: No congestion/rhinnorhea. Mouth/Throat: Mucous membranes are moist.  Oropharynx non-erythematous. Neck: No stridor.  {**No cervical spine tenderness to palpation.**} {**Hematological/Lymphatic/Immunilogical: No cervical lymphadenopathy. **}Cardiovascular: Normal rate, regular rhythm. Grossly  normal heart sounds.  Good peripheral circulation. Respiratory: Normal respiratory effort.  No retractions. Lungs CTAB. Gastrointestinal: Soft and nontender. No distention. No abdominal bruits. No CVA tenderness. {**Genitourinary:  **}Musculoskeletal: No lower extremity tenderness nor edema.  No joint effusions. Neurologic:  Normal speech and language. No gross focal neurologic deficits are appreciated. No gait instability. Skin:  Skin is warm, dry and intact. No rash noted. Psychiatric: Mood and affect are normal. Speech and behavior are normal.  ____________________________________________   LABS (all labs ordered are listed, but only abnormal results are displayed)  Labs Reviewed  BASIC METABOLIC PANEL - Abnormal; Notable for the following components:      Result Value   Glucose, Bld 101 (*)    Calcium 8.6 (*)    Anion gap 4 (*)    All other components within normal limits  RESP PANEL BY RT-PCR (RSV, FLU A&B, COVID)  RVPGX2  GROUP A STREP BY PCR  CBC WITH DIFFERENTIAL/PLATELET    ____________________________________________  EKG  *** ____________________________________________  RADIOLOGY  ED MD interpretation:  ***  Official radiology report(s): CT Soft Tissue Neck W Contrast  Result Date: 04/09/2022 CLINICAL DATA:  Headache, chills, and cough, soft tissue infection suspected EXAM: CT NECK WITH CONTRAST TECHNIQUE: Multidetector CT imaging of the neck was performed using the standard protocol following the bolus administration of intravenous contrast. RADIATION DOSE REDUCTION: This exam was performed according to the departmental dose-optimization program which includes automated exposure control, adjustment of the mA and/or kV according to patient size and/or use of iterative reconstruction technique. CONTRAST:  75mL OMNIPAQUE IOHEXOL 300 MG/ML  SOLN COMPARISON:  None Available. FINDINGS: Pharynx and larynx: Normal. No mass or swelling. Salivary glands: No inflammation, mass, or stone. Thyroid: Normal. Lymph nodes: Prominent cervical lymph nodes, which retain normal morphology. For example, a left level 2B lymph node that measures up to 8 mm in short axis (series 2, image 30), a right level 2A lymph node that measures up to 10 mm in short axis (image 107) and a left level 1B lymph node that measures up to 8 mm in short axis (series 2, image 46). No suppurative lymphadenopathy. Vascular: Patent. Limited intracranial: Negative. Visualized orbits: Negative. Mastoids and visualized paranasal sinuses: Overall clear paranasal sinuses. The mastoids are well aerated. Skeleton: No acute osseous abnormality. Upper chest: No focal pulmonary opacity or pleural effusion. Subpleural lucencies, possibly blebs. Other: None. IMPRESSION: 1. Prominent cervical lymph nodes, which retain normal morphology, likely reactive. No suppurative lymphadenopathy. 2. No definite acute process in the neck. Electronically Signed   By: Wiliam KeAlison  Vasan M.D.   On: 04/09/2022 21:53   DG Neck Soft  Tissue  Result Date: 04/09/2022 CLINICAL DATA:  Swelling in tracheostomy EXAM: NECK SOFT TISSUES - 1+ VIEW COMPARISON:  None Available. FINDINGS: The hypopharynx is not distended. Epiglottis and aryepiglottic folds are normal. Subglottic airway is widely patent. There is subglottic soft tissue swelling noted anteriorly in the region of the expected tracheostomy. Retropharyngeal soft tissues are not thickened. Limited evaluation of the cervical spine is unremarkable. IMPRESSION: 1. Subglottic soft tissue swelling in the region of the expected tracheostomy. Electronically Signed   By: Helyn NumbersAshesh  Parikh M.D.   On: 04/09/2022 19:01   DG Chest 2 View  Result Date: 04/09/2022 CLINICAL DATA:  Cough, headache, congestion EXAM: CHEST - 2 VIEW COMPARISON:  02/15/2022 FINDINGS: Frontal and lateral views of the chest demonstrate an unremarkable cardiac silhouette. No acute airspace disease, effusion, or pneumothorax. No acute bony abnormality. IMPRESSION: 1. Stable chest,  no acute process. Electronically Signed   By: Sharlet Salina M.D.   On: 04/09/2022 18:15    ____________________________________________   PROCEDURES  Procedure(s) performed: {Name/None:19197::"***, see procedure note(s).","None"}  Procedures  Critical Care performed: {CriticalCareYesNo:19197::"Yes, see critical care note(s)","No"}  ____________________________________________   INITIAL IMPRESSION / ASSESSMENT AND PLAN / ED COURSE  @[email or web address] @   ***      ____________________________________________   FINAL CLINICAL IMPRESSION(S) / ED DIAGNOSES  Final diagnoses:  Acute URI  Soft tissue swelling     ED Discharge Orders     None        Note:  This document was prepared using Dragon voice recognition software and may include unintentional dictation errors.

## 2022-04-09 NOTE — Discharge Instructions (Signed)
You have been seen today in the emergency room and diagnosed with an upper respiratory infection.  You should take over-the-counter medications to treat your symptoms.  If your symptoms persist or worsen you should follow-up with your primary care provider.

## 2022-04-09 NOTE — ED Triage Notes (Signed)
Pt to ED via POV from home. Pt reports HA, chills and cough that started today. Pt denies fever.

## 2022-04-15 ENCOUNTER — Emergency Department
Admission: EM | Admit: 2022-04-15 | Discharge: 2022-04-15 | Disposition: A | Discharge status: Home / Self Care | Payer: No Typology Code available for payment source | Attending: Emergency Medicine | Admitting: Emergency Medicine

## 2022-04-15 ENCOUNTER — Other Ambulatory Visit: Payer: Self-pay

## 2022-04-15 DIAGNOSIS — S43402A Unspecified sprain of left shoulder joint, initial encounter: Secondary | ICD-10-CM | POA: Diagnosis not present

## 2022-04-15 DIAGNOSIS — S4992XA Unspecified injury of left shoulder and upper arm, initial encounter: Secondary | ICD-10-CM | POA: Diagnosis present

## 2022-04-15 DIAGNOSIS — Y92410 Unspecified street and highway as the place of occurrence of the external cause: Secondary | ICD-10-CM | POA: Diagnosis not present

## 2022-04-15 MED ORDER — NAPROXEN 500 MG PO TABS: 500.0000 mg | ORAL_TABLET | Freq: Two times a day (BID) | ORAL | 2 refills | Status: DC

## 2022-04-15 MED ORDER — METHOCARBAMOL 500 MG PO TABS: 500.0000 mg | ORAL_TABLET | Freq: Three times a day (TID) | ORAL | 1 refills | Status: DC | PRN
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
## 2022-04-17 ENCOUNTER — Encounter: Payer: Self-pay | Admitting: Emergency Medicine

## 2022-04-17 ENCOUNTER — Ambulatory Visit (INDEPENDENT_AMBULATORY_CARE_PROVIDER_SITE_OTHER): Payer: Medicaid Other

## 2022-04-17 ENCOUNTER — Ambulatory Visit
Admission: EM | Admit: 2022-04-17 | Discharge: 2022-04-17 | Disposition: A | Discharge status: Home / Self Care | Payer: Medicaid Other

## 2022-04-17 DIAGNOSIS — S46912D Strain of unspecified muscle, fascia and tendon at shoulder and upper arm level, left arm, subsequent encounter: Secondary | ICD-10-CM | POA: Diagnosis not present

## 2022-04-17 DIAGNOSIS — M25512 Pain in left shoulder: Secondary | ICD-10-CM

## 2022-04-17 MED ORDER — METHYLPREDNISOLONE 4 MG PO TBPK: ORAL_TABLET | ORAL | 0 refills | Status: DC
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
## 2022-04-17 NOTE — Discharge Instructions (Signed)
Start steroid Dosepak Continue naproxen and Robaxin as prescribed in ER Heat to the area Rest Follow-up with your PCP if symptoms or not improving Please return to the emergency room if your symptoms are worsening
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
## 2022-04-22 ENCOUNTER — Emergency Department
Admission: EM | Admit: 2022-04-22 | Discharge: 2022-04-22 | Disposition: A | Discharge status: Home / Self Care | Payer: Medicaid Other | Attending: Emergency Medicine | Admitting: Emergency Medicine

## 2022-04-22 ENCOUNTER — Emergency Department: Payer: Medicaid Other

## 2022-04-22 ENCOUNTER — Other Ambulatory Visit: Payer: Self-pay

## 2022-04-22 DIAGNOSIS — R0602 Shortness of breath: Secondary | ICD-10-CM | POA: Diagnosis present

## 2022-04-22 DIAGNOSIS — Z1152 Encounter for screening for COVID-19: Secondary | ICD-10-CM | POA: Insufficient documentation

## 2022-04-22 DIAGNOSIS — J101 Influenza due to other identified influenza virus with other respiratory manifestations: Secondary | ICD-10-CM | POA: Insufficient documentation

## 2022-04-22 DIAGNOSIS — R000 Tachycardia, unspecified: Secondary | ICD-10-CM | POA: Insufficient documentation

## 2022-04-22 DIAGNOSIS — I10 Essential (primary) hypertension: Secondary | ICD-10-CM | POA: Insufficient documentation

## 2022-04-22 DIAGNOSIS — J441 Chronic obstructive pulmonary disease with (acute) exacerbation: Secondary | ICD-10-CM | POA: Insufficient documentation

## 2022-04-22 LAB — BASIC METABOLIC PANEL
Anion gap: 8 (ref 5–15)
BUN: 12 mg/dL (ref 6–20)
CO2: 21 mmol/L — ABNORMAL LOW (ref 22–32)
Calcium: 9.3 mg/dL (ref 8.9–10.3)
Chloride: 105 mmol/L (ref 98–111)
Creatinine, Ser: 0.83 mg/dL (ref 0.44–1.00)
GFR, Estimated: >60 mL/min (ref >60)
Glucose, Bld: 101 mg/dL — ABNORMAL HIGH (ref 70–99)
Potassium: 3.3 mmol/L — ABNORMAL LOW (ref 3.5–5.1)
Sodium: 134 mmol/L — ABNORMAL LOW (ref 135–145)

## 2022-04-22 LAB — TROPONIN I (HIGH SENSITIVITY): Troponin I (High Sensitivity): 3 ng/L (ref <18)

## 2022-04-22 LAB — CBC
HCT: 40.2 % (ref 36.0–46.0)
Hemoglobin: 13.8 g/dL (ref 12.0–15.0)
MCH: 32.2 pg (ref 26.0–34.0)
MCHC: 34.3 g/dL (ref 30.0–36.0)
MCV: 93.9 fL (ref 80.0–100.0)
Platelets: 217 10*3/uL (ref 150–400)
RBC: 4.28 MIL/uL (ref 3.87–5.11)
RDW: 12 % (ref 11.5–15.5)
WBC: 9.5 10*3/uL (ref 4.0–10.5)
nRBC: 0 % (ref 0.0–0.2)

## 2022-04-22 LAB — RESP PANEL BY RT-PCR (RSV, FLU A&B, COVID)  RVPGX2
Influenza A by PCR: POSITIVE — AB
Influenza B by PCR: NEGATIVE
Resp Syncytial Virus by PCR: NEGATIVE
SARS Coronavirus 2 by RT PCR: NEGATIVE

## 2022-04-22 LAB — HCG, QUANTITATIVE, PREGNANCY: hCG, Beta Chain, Quant, S: <1 m[IU]/mL (ref <5)

## 2022-04-22 MED ORDER — ALBUTEROL SULFATE HFA 108 (90 BASE) MCG/ACT IN AERS: 2.0000 | INHALATION_SPRAY | Freq: Four times a day (QID) | RESPIRATORY_TRACT | 2 refills | Status: DC | PRN

## 2022-04-22 MED ORDER — BUDESONIDE-FORMOTEROL FUMARATE 80-4.5 MCG/ACT IN AERO: 2.0000 | INHALATION_SPRAY | Freq: Two times a day (BID) | RESPIRATORY_TRACT | 2 refills | Status: AC

## 2022-04-22 MED ORDER — OSELTAMIVIR PHOSPHATE 75 MG PO CAPS: 75.0000 mg | ORAL_CAPSULE | Freq: Two times a day (BID) | ORAL | 0 refills | Status: AC

## 2022-04-22 MED ORDER — LACTATED RINGERS IV BOLUS
1000.0000 mL | Freq: Once | INTRAVENOUS | Status: AC
  Administered 2022-04-22: 1000 mL via INTRAVENOUS

## 2022-04-22 MED ORDER — PREDNISONE 50 MG PO TABS: ORAL_TABLET | ORAL | 0 refills | Status: DC

## 2022-04-22 MED ORDER — KETOROLAC TROMETHAMINE 15 MG/ML IJ SOLN
15.0000 mg | Freq: Once | INTRAMUSCULAR | Status: AC
  Administered 2022-04-22: 15 mg via INTRAVENOUS
  Filled 2022-04-22: qty 1

## 2022-04-22 MED ORDER — ACETAMINOPHEN 325 MG PO TABS
650.0000 mg | ORAL_TABLET | Freq: Once | ORAL | Status: AC | PRN
  Administered 2022-04-22: 650 mg via ORAL
  Filled 2022-04-22: qty 2

## 2022-04-22 MED ORDER — IPRATROPIUM-ALBUTEROL 0.5-2.5 (3) MG/3ML IN SOLN
3.0000 mL | Freq: Once | RESPIRATORY_TRACT | Status: AC
  Administered 2022-04-22: 3 mL via RESPIRATORY_TRACT
  Filled 2022-04-22: qty 3

## 2022-04-22 MED ORDER — METHYLPREDNISOLONE SODIUM SUCC 125 MG IJ SOLR
60.0000 mg | Freq: Once | INTRAMUSCULAR | Status: AC
  Administered 2022-04-22: 60 mg via INTRAVENOUS
  Filled 2022-04-22: qty 2

## 2022-04-22 MED ORDER — OSELTAMIVIR PHOSPHATE 75 MG PO CAPS
75.0000 mg | ORAL_CAPSULE | Freq: Once | ORAL | Status: AC
  Administered 2022-04-22: 75 mg via ORAL
  Filled 2022-04-22: qty 1
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
## 2022-04-22 NOTE — Discharge Instructions (Addendum)
You have the flu which is causing exacerbation of your COPD.  Please take the Tamiflu twice a day for the next 5 days.  Please also take the prednisone once a day for the next 5 days.  Please start taking her Symbicort again and you can use the albuterol inhaler 2 to 4 puffs every 4 hours for your shortness of breath.  Please return to the emergency department if your breathing is worsening.

## 2022-04-22 NOTE — ED Triage Notes (Signed)
Reports chest pain, cough, body aches since yesterday.  Hx of COPD low grade temp in triage.

## 2022-04-24 ENCOUNTER — Ambulatory Visit
Admission: EM | Admit: 2022-04-24 | Discharge: 2022-04-24 | Discharge status: Against Medical Advice | Payer: Medicaid Other

## 2022-04-24 NOTE — ED Notes (Signed)
Patient called x2 via phone with no answer.

## 2022-04-24 NOTE — ED Notes (Signed)
Attempted to call patient for the 3rd time via phone with no answer.

## 2022-05-29 ENCOUNTER — Emergency Department
Admission: EM | Admit: 2022-05-29 | Discharge: 2022-05-29 | Disposition: A | Discharge status: Home / Self Care | Payer: Medicaid Other | Attending: Emergency Medicine | Admitting: Emergency Medicine

## 2022-05-29 ENCOUNTER — Other Ambulatory Visit: Payer: Self-pay

## 2022-05-29 ENCOUNTER — Emergency Department: Payer: Medicaid Other

## 2022-05-29 DIAGNOSIS — R059 Cough, unspecified: Secondary | ICD-10-CM | POA: Diagnosis present

## 2022-05-29 DIAGNOSIS — J441 Chronic obstructive pulmonary disease with (acute) exacerbation: Secondary | ICD-10-CM | POA: Insufficient documentation

## 2022-05-29 DIAGNOSIS — Z20822 Contact with and (suspected) exposure to covid-19: Secondary | ICD-10-CM | POA: Diagnosis not present

## 2022-05-29 DIAGNOSIS — J45909 Unspecified asthma, uncomplicated: Secondary | ICD-10-CM | POA: Insufficient documentation

## 2022-05-29 DIAGNOSIS — J069 Acute upper respiratory infection, unspecified: Secondary | ICD-10-CM

## 2022-05-29 LAB — RESP PANEL BY RT-PCR (RSV, FLU A&B, COVID)  RVPGX2
Influenza A by PCR: NEGATIVE
Influenza B by PCR: NEGATIVE
Resp Syncytial Virus by PCR: NEGATIVE
SARS Coronavirus 2 by RT PCR: NEGATIVE

## 2022-05-29 MED ORDER — ALBUTEROL SULFATE HFA 108 (90 BASE) MCG/ACT IN AERS: 2.0000 | INHALATION_SPRAY | Freq: Four times a day (QID) | RESPIRATORY_TRACT | 1 refills | Status: DC | PRN

## 2022-05-29 MED ORDER — ACETAMINOPHEN 325 MG PO TABS
650.0000 mg | ORAL_TABLET | Freq: Once | ORAL | Status: AC
  Administered 2022-05-29: 650 mg via ORAL
  Filled 2022-05-29: qty 2

## 2022-05-29 MED ORDER — PREDNISONE 50 MG PO TABS: 50.0000 mg | ORAL_TABLET | Freq: Every day | ORAL | 0 refills | Status: DC

## 2022-05-29 MED ORDER — PSEUDOEPH-BROMPHEN-DM 30-2-10 MG/5ML PO SYRP: 10.0000 mL | ORAL_SOLUTION | Freq: Four times a day (QID) | ORAL | 0 refills | Status: DC | PRN

## 2022-05-29 MED ORDER — IPRATROPIUM-ALBUTEROL 0.5-2.5 (3) MG/3ML IN SOLN
6.0000 mL | Freq: Once | RESPIRATORY_TRACT | Status: AC
  Administered 2022-05-29: 6 mL via RESPIRATORY_TRACT
  Filled 2022-05-29: qty 3

## 2022-05-29 MED ORDER — HYDROCODONE-ACETAMINOPHEN 5-325 MG PO TABS
1.0000 | ORAL_TABLET | Freq: Once | ORAL | Status: AC
  Administered 2022-05-29: 1 via ORAL
  Filled 2022-05-29: qty 1

## 2022-05-29 MED ORDER — BENZONATATE 100 MG PO CAPS: 100.0000 mg | ORAL_CAPSULE | Freq: Three times a day (TID) | ORAL | 0 refills | Status: DC | PRN

## 2022-05-29 MED ORDER — METHYLPREDNISOLONE SODIUM SUCC 125 MG IJ SOLR
125.0000 mg | Freq: Once | INTRAMUSCULAR | Status: AC
  Administered 2022-05-29: 125 mg via INTRAMUSCULAR
  Filled 2022-05-29: qty 2

## 2022-05-29 NOTE — ED Notes (Signed)
Discharge instructions explained to patient and family at this time. Patient and family state they understand and agree.   

## 2022-05-29 NOTE — ED Triage Notes (Signed)
Pt to ED from home for Cough, congestion, runny nose and headache x 3 days with bodyaches and chills. Pt is CAOx4 and in no acute distress and ambulatory in triage.

## 2022-05-29 NOTE — ED Provider Notes (Signed)
[facility] Provider Note  Patient Contact: 9:41 PM (approximate)   History   Cough and Headache (X3 days)   HPI  Jacqueline Cooke is a 42 y.o. female who presents emergency department with headache, body aches, cough, wheezing.  Patient has a history of asthma/COPD and states that she has had viral symptoms for the last 2 to 3 days.  She is having bodyaches, cough, headache.  Patient has not taken any of her medications for her asthma/COPD despite having increased shortness of breath and wheezing.  No chest pain, no GI complaints.     Physical Exam   Triage Vital Signs: ED Triage Vitals  Enc Vitals Group     BP 05/29/22 1958 119/75     Pulse Rate 05/29/22 1958 80     Resp 05/29/22 1958 16     Temp 05/29/22 1958 98.3 F (36.8 C)     Temp Source 05/29/22 1958 Oral     SpO2 05/29/22 1958 97 %     Weight 05/29/22 1959 190 lb (86.2 kg)     Height 05/29/22 1959 5\' 10"  (1.778 m)     Head Circumference --      Peak Flow --      Pain Score 05/29/22 1959 0     Pain Loc --      Pain Edu? --      Excl. in [place]? --     Most recent vital signs: Vitals:   05/29/22 1958  BP: 119/75  Pulse: 80  Resp: 16  Temp: 98.3 F (36.8 C)  SpO2: 97%     General: Alert and in no acute distress. ENT:      Ears:       Nose: No congestion/rhinnorhea.      Mouth/Throat: Mucous membranes are moist. Neck: No stridor.   Cardiovascular:  Good peripheral perfusion Respiratory: Normal respiratory effort without tachypnea or retractions. Lungs with some expiratory wheezing and mild crackles in the lower lung fields.Kermit Balo air entry to the bases with no decreased or absent breath sounds Musculoskeletal: Full range of motion to all extremities.  Neurologic:  No gross focal neurologic deficits are appreciated.  Skin:   No rash noted Other:   ED Results / Procedures / Treatments   Labs (all labs ordered are listed, but only abnormal results are displayed) Labs  Reviewed  RESP PANEL BY RT-PCR (RSV, FLU A&B, COVID)  [id number]     EKG     RADIOLOGY  I personally viewed, evaluated, and interpreted these images as part of my medical decision making, as well as reviewing the written report by the radiologist.  ED Provider Interpretation: No consolidation concerning for pneumonia  DG Chest 2 View  Result Date: 05/29/2022 CLINICAL DATA:  Cough, congestion, runny nose EXAM: CHEST - 2 VIEW COMPARISON:  04/22/2022 FINDINGS: Lungs are clear.  No pleural effusion or pneumothorax. The heart is normal in size. Visualized osseous structures are within normal limits. IMPRESSION: Normal chest radiographs. Electronically Signed   By: Julian Hy M.D.   On: 05/29/2022 20:22    PROCEDURES:  Critical Care performed: No  Procedures   MEDICATIONS ORDERED IN ED: Medications  acetaminophen (TYLENOL) tablet 650 mg (650 mg Oral Given 05/29/22 2052)  ipratropium-albuterol (DUONEB) 0.5-2.5 (3) MG/3ML nebulizer solution 6 mL (6 mLs Nebulization Given 05/29/22 2206)  methylPREDNISolone sodium succinate (SOLU-MEDROL) 125 mg/2 mL injection 125 mg (125 mg Intramuscular Given 05/29/22 2206)  HYDROcodone-acetaminophen (NORCO/VICODIN) 5-325 MG per tablet 1 tablet (  1 tablet Oral Given 05/29/22 2206)     IMPRESSION / MDM / ASSESSMENT AND PLAN / ED COURSE  I reviewed the triage vital signs and the nursing notes.                                 Differential diagnosis includes, but is not limited to, COPD exacerbation, asthma exacerbation, viral illness, COVID, flu, RSV, bronchitis  Patient's presentation is most consistent with acute presentation with potential threat to life or bodily function.   Patient's diagnosis is consistent with viral respiratory infection with increased COPD symptoms.  Presents emergency department with viral symptoms.  Negative on swab for COVID, flu and RSV.  Patient had some wheezing secondary to her asthma/COPD.  This improved significantly  with nebulized DuoNebs and Solu-Medrol.  Patient is currently stable for discharge.  I will prescribe some controlled medication of cough medications, instructed Tylenol and Motrin at home for additional symptom relief.  I am also can prescribe the patient prednisone for her asthma/COPD exacerbation secondary to the virus.  Patient will have a prescription for rescue inhaler to ensure that she has enough to use over the next 4 days.  Return precautions discussed with patient.  Follow-up primary care as needed..  Patient is given ED precautions to return to the ED for any worsening or new symptoms.     FINAL CLINICAL IMPRESSION(S) / ED DIAGNOSES   Final diagnoses:  Viral URI with cough  Chronic obstructive pulmonary disease with acute exacerbation ([place])     Rx / DC Orders   ED Discharge Orders          Ordered    predniSONE (DELTASONE) 50 MG tablet  Daily with breakfast        05/29/22 2311    albuterol (VENTOLIN HFA) 108 (90 Base) MCG/ACT inhaler  Every 6 hours PRN        05/29/22 2311    brompheniramine-pseudoephedrine-DM 30-2-10 MG/5ML syrup  4 times daily PRN        05/29/22 2311    benzonatate (TESSALON PERLES) 100 MG capsule  3 times daily PRN        05/29/22 2311             Note:  This document was prepared using Dragon voice recognition software and may include unintentional dictation errors.   Brynda Peon 05/29/22 2311    Rada Hay, MD 05/30/22 [phone]

## 2022-06-01 ENCOUNTER — Other Ambulatory Visit: Payer: Medicaid Other

## 2022-06-03 ENCOUNTER — Ambulatory Visit (LOCAL_COMMUNITY_HEALTH_CENTER): Payer: Self-pay

## 2022-06-03 DIAGNOSIS — Z111 Encounter for screening for respiratory tuberculosis: Secondary | ICD-10-CM

## 2022-06-06 ENCOUNTER — Ambulatory Visit (LOCAL_COMMUNITY_HEALTH_CENTER): Payer: Medicaid Other

## 2022-06-06 DIAGNOSIS — Z111 Encounter for screening for respiratory tuberculosis: Secondary | ICD-10-CM

## 2022-06-06 LAB — TB SKIN TEST
Induration: 0 mm
TB Skin Test: NEGATIVE

## 2022-06-22 ENCOUNTER — Other Ambulatory Visit: Payer: Self-pay

## 2022-06-22 ENCOUNTER — Emergency Department
Admission: EM | Admit: 2022-06-22 | Discharge: 2022-06-22 | Disposition: A | Discharge status: Home / Self Care | Payer: Medicaid Other | Attending: Emergency Medicine | Admitting: Emergency Medicine

## 2022-06-22 ENCOUNTER — Emergency Department: Payer: Medicaid Other

## 2022-06-22 ENCOUNTER — Encounter: Payer: Self-pay | Admitting: Emergency Medicine

## 2022-06-22 DIAGNOSIS — Z1152 Encounter for screening for COVID-19: Secondary | ICD-10-CM | POA: Diagnosis not present

## 2022-06-22 DIAGNOSIS — J441 Chronic obstructive pulmonary disease with (acute) exacerbation: Secondary | ICD-10-CM | POA: Insufficient documentation

## 2022-06-22 DIAGNOSIS — J40 Bronchitis, not specified as acute or chronic: Secondary | ICD-10-CM | POA: Diagnosis not present

## 2022-06-22 DIAGNOSIS — I10 Essential (primary) hypertension: Secondary | ICD-10-CM | POA: Insufficient documentation

## 2022-06-22 DIAGNOSIS — R0602 Shortness of breath: Secondary | ICD-10-CM | POA: Diagnosis present

## 2022-06-22 LAB — BASIC METABOLIC PANEL
Anion gap: 4 — ABNORMAL LOW (ref 5–15)
BUN: 12 mg/dL (ref 6–20)
CO2: 25 mmol/L (ref 22–32)
Calcium: 9 mg/dL (ref 8.9–10.3)
Chloride: 109 mmol/L (ref 98–111)
Creatinine, Ser: 0.69 mg/dL (ref 0.44–1.00)
GFR, Estimated: >60 mL/min (ref >60)
Glucose, Bld: 97 mg/dL (ref 70–99)
Potassium: 3.5 mmol/L (ref 3.5–5.1)
Sodium: 138 mmol/L (ref 135–145)

## 2022-06-22 LAB — CBC
HCT: 40.4 % (ref 36.0–46.0)
Hemoglobin: 13.7 g/dL (ref 12.0–15.0)
MCH: 31.5 pg (ref 26.0–34.0)
MCHC: 33.9 g/dL (ref 30.0–36.0)
MCV: 92.9 fL (ref 80.0–100.0)
Platelets: 241 10*3/uL (ref 150–400)
RBC: 4.35 MIL/uL (ref 3.87–5.11)
RDW: 11.8 % (ref 11.5–15.5)
WBC: 8.6 10*3/uL (ref 4.0–10.5)
nRBC: 0 % (ref 0.0–0.2)

## 2022-06-22 LAB — RESP PANEL BY RT-PCR (RSV, FLU A&B, COVID)  RVPGX2
Influenza A by PCR: NEGATIVE
Influenza B by PCR: NEGATIVE
Resp Syncytial Virus by PCR: NEGATIVE
SARS Coronavirus 2 by RT PCR: NEGATIVE

## 2022-06-22 LAB — TROPONIN I (HIGH SENSITIVITY): Troponin I (High Sensitivity): <2 ng/L (ref <18)

## 2022-06-22 MED ORDER — METHYLPREDNISOLONE SODIUM SUCC 125 MG IJ SOLR
125.0000 mg | Freq: Once | INTRAMUSCULAR | Status: AC
  Administered 2022-06-22: 125 mg via INTRAVENOUS
  Filled 2022-06-22: qty 2

## 2022-06-22 MED ORDER — KETOROLAC TROMETHAMINE 30 MG/ML IJ SOLN
15.0000 mg | Freq: Once | INTRAMUSCULAR | Status: AC
  Administered 2022-06-22: 15 mg via INTRAVENOUS
  Filled 2022-06-22: qty 1

## 2022-06-22 MED ORDER — LORAZEPAM 2 MG/ML IJ SOLN
0.5000 mg | Freq: Once | INTRAMUSCULAR | Status: AC
  Administered 2022-06-22: 0.5 mg via INTRAVENOUS
  Filled 2022-06-22: qty 1

## 2022-06-22 MED ORDER — AZITHROMYCIN 250 MG PO TABS: ORAL_TABLET | ORAL | 0 refills | Status: AC

## 2022-06-22 MED ORDER — PREDNISONE 10 MG PO TABS: 10.0000 mg | ORAL_TABLET | Freq: Every day | ORAL | 0 refills | Status: DC

## 2022-06-22 MED ORDER — GUAIFENESIN-CODEINE 100-10 MG/5ML PO SOLN: 5.0000 mL | Freq: Four times a day (QID) | ORAL | 0 refills | Status: DC | PRN

## 2022-06-22 MED ORDER — HYDROCOD POLI-CHLORPHE POLI ER 10-8 MG/5ML PO SUER
5.0000 mL | Freq: Once | ORAL | Status: AC
  Administered 2022-06-22: 5 mL via ORAL
  Filled 2022-06-22: qty 5

## 2022-06-22 MED ORDER — IPRATROPIUM-ALBUTEROL 0.5-2.5 (3) MG/3ML IN SOLN
3.0000 mL | Freq: Once | RESPIRATORY_TRACT | Status: AC
  Administered 2022-06-22: 3 mL via RESPIRATORY_TRACT
  Filled 2022-06-22: qty 3

## 2022-06-22 NOTE — ED Notes (Signed)
X ray to bring pt to room when done with scan. Husband in tx room.

## 2022-06-22 NOTE — ED Triage Notes (Signed)
Pt reports using her inhaler and neb tx over night. Endorses chest tightness and SOB and worried about COPD exacerbation. Also having productive cough.

## 2022-06-22 NOTE — ED Provider Notes (Signed)
[facility] Provider Note    Event Date/Time   First MD Initiated Contact with Patient 06/22/22 [phone]     (approximate)  History   Chief Complaint: Shortness of Breath and Chest Pain  HPI  Jacqueline Cooke is a 42 y.o. female with a past medical history of asthma, COPD, hypertension, presents emergency department for shortness of breath.  According to the patient for the past 3 weeks or so she has been experiencing increased cough shortness of breath and wheeze.  Patient was seen for the same approximately 3 weeks ago per patient finished her course of steroids but states that wheezes come back.  Patient states for the last 2 days she has had increased wheeze and increased cough.  Has been using her nebulizer treatments at home without success so she came to the emergency department today for shortness of breath.  Reassuringly patient is satting 97% on room air.  No baseline O2 requirement.  Physical Exam   Triage Vital Signs: ED Triage Vitals [06/22/22 0725]  Enc Vitals Group     BP 111/76     Pulse Rate 80     Resp 20     Temp 97.8 F (36.6 C)     Temp Source Oral     SpO2 97 %     Weight 190 lb (86.2 kg)     Height 5' 10"$  (1.778 m)     Head Circumference      Peak Flow      Pain Score 7     Pain Loc      Pain Edu?      Excl. in [place]?     Most recent vital signs: Vitals:   06/22/22 0725  BP: 111/76  Pulse: 80  Resp: 20  Temp: 97.8 F (36.6 C)  SpO2: 97%    General: Awake, no distress.  No cough throughout exam. CV:  Good peripheral perfusion.  Regular rate and rhythm  Resp:  Normal effort.  Respiratory rate around 20 to 25 breaths/min with a mild to moderate expiratory wheeze. Abd:  No distention.  Soft, nontender.  No rebound or guarding.   ED Results / Procedures / Treatments   EKG  EKG viewed and interpreted by myself shows a normal sinus rhythm at 74 bpm with a narrow QRS normal axis, normal intervals, no concerning ST  changes.  RADIOLOGY  I have reviewed and interpreted the chest x-ray images.  No consolidation seen on my evaluation.   MEDICATIONS ORDERED IN ED: Medications  methylPREDNISolone sodium succinate (SOLU-MEDROL) 125 mg/2 mL injection 125 mg (has no administration in time range)  ipratropium-albuterol (DUONEB) 0.5-2.5 (3) MG/3ML nebulizer solution 3 mL (has no administration in time range)  ipratropium-albuterol (DUONEB) 0.5-2.5 (3) MG/3ML nebulizer solution 3 mL (has no administration in time range)  ipratropium-albuterol (DUONEB) 0.5-2.5 (3) MG/3ML nebulizer solution 3 mL (has no administration in time range)     IMPRESSION / MDM / ASSESSMENT AND PLAN / ED COURSE  I reviewed the triage vital signs and the nursing notes.  Patient's presentation is most consistent with acute presentation with potential threat to life or bodily function.  Patient presents emergency department for worsening shortness of breath.  Patient has a history of asthma as well as COPD she does have expiratory wheeze on exam.  We will check labs including cardiac enzymes.  We will obtain an EKG and a chest x-ray.  We will dose IV Solu-Medrol as well as DuoNebs and continue  to closely monitor.  Given the patient's increased cough we will also obtain a COVID/flu/RSV swab as a precaution.  Patient agreeable to plan.  Patient states he is feeling much better after cough medication.  Patient's workup is reassuring COVID flu and RSV are negative.  Chemistry is normal, CBC is normal including white blood cell count.  Troponin is negative.  Chest x-ray is clear and EKG reassuring.  Given the patient's reassuring work and suspect COPD possibly with bronchitis.  Will treat with prednisone and Zithromax and a cough medication.  Patient states she is feeling better and is ready to go home.  I discussed very strict return precautions.  Patient agreeable to plan.  FINAL CLINICAL IMPRESSION(S) / ED DIAGNOSES   Dyspnea COPD  exacerbation Bronchitis  Note:  This document was prepared using Dragon voice recognition software and may include unintentional dictation errors.   Harvest Dark, MD 06/22/22 1025

## 2022-09-06 DIAGNOSIS — M25561 Pain in right knee: Secondary | ICD-10-CM | POA: Diagnosis not present

## 2022-09-14 ENCOUNTER — Emergency Department: Payer: 59

## 2022-09-14 ENCOUNTER — Emergency Department
Admission: EM | Admit: 2022-09-14 | Discharge: 2022-09-14 | Disposition: A | Discharge status: Home / Self Care | Payer: 59 | Attending: Emergency Medicine | Admitting: Emergency Medicine

## 2022-09-14 DIAGNOSIS — J45909 Unspecified asthma, uncomplicated: Secondary | ICD-10-CM | POA: Diagnosis not present

## 2022-09-14 DIAGNOSIS — J449 Chronic obstructive pulmonary disease, unspecified: Secondary | ICD-10-CM | POA: Diagnosis not present

## 2022-09-14 DIAGNOSIS — R051 Acute cough: Secondary | ICD-10-CM | POA: Diagnosis not present

## 2022-09-14 DIAGNOSIS — R0789 Other chest pain: Secondary | ICD-10-CM | POA: Insufficient documentation

## 2022-09-14 DIAGNOSIS — I10 Essential (primary) hypertension: Secondary | ICD-10-CM | POA: Diagnosis not present

## 2022-09-14 LAB — BASIC METABOLIC PANEL
Anion gap: 8 (ref 5–15)
BUN: 11 mg/dL (ref 6–20)
CO2: 25 mmol/L (ref 22–32)
Calcium: 9.1 mg/dL (ref 8.9–10.3)
Chloride: 105 mmol/L (ref 98–111)
Creatinine, Ser: 0.8 mg/dL (ref 0.44–1.00)
GFR, Estimated: >60 mL/min (ref >60)
Glucose, Bld: 86 mg/dL (ref 70–99)
Potassium: 3.7 mmol/L (ref 3.5–5.1)
Sodium: 138 mmol/L (ref 135–145)

## 2022-09-14 LAB — CBC
HCT: 43.6 % (ref 36.0–46.0)
Hemoglobin: 14.8 g/dL (ref 12.0–15.0)
MCH: 31.4 pg (ref 26.0–34.0)
MCHC: 33.9 g/dL (ref 30.0–36.0)
MCV: 92.6 fL (ref 80.0–100.0)
Platelets: 233 10*3/uL (ref 150–400)
RBC: 4.71 MIL/uL (ref 3.87–5.11)
RDW: 12.3 % (ref 11.5–15.5)
WBC: 7.1 10*3/uL (ref 4.0–10.5)
nRBC: 0 % (ref 0.0–0.2)

## 2022-09-14 LAB — TROPONIN I (HIGH SENSITIVITY): Troponin I (High Sensitivity): <2 ng/L (ref <18)

## 2022-09-14 MED ORDER — CYCLOBENZAPRINE HCL 10 MG PO TABS: 10.0000 mg | ORAL_TABLET | Freq: Three times a day (TID) | ORAL | 0 refills | Status: DC | PRN

## 2022-09-14 MED ORDER — CYCLOBENZAPRINE HCL 10 MG PO TABS: 10.0000 mg | ORAL_TABLET | Freq: Three times a day (TID) | ORAL | 0 refills | Status: AC | PRN

## 2022-09-14 MED ORDER — NAPROXEN 500 MG PO TABS: 500.0000 mg | ORAL_TABLET | Freq: Two times a day (BID) | ORAL | 0 refills | Status: AC

## 2022-09-14 MED ORDER — NAPROXEN 500 MG PO TABS: 500.0000 mg | ORAL_TABLET | Freq: Two times a day (BID) | ORAL | 0 refills | Status: DC

## 2022-09-14 MED ORDER — LIDOCAINE 5 % EX PTCH
1.0000 | MEDICATED_PATCH | CUTANEOUS | Status: DC
  Administered 2022-09-14: 1 via TRANSDERMAL
  Filled 2022-09-14: qty 1

## 2022-09-14 MED ORDER — PREDNISONE 10 MG PO TABS: 40.0000 mg | ORAL_TABLET | Freq: Every day | ORAL | 0 refills | Status: DC

## 2022-09-14 MED ORDER — KETOROLAC TROMETHAMINE 15 MG/ML IJ SOLN
15.0000 mg | Freq: Once | INTRAMUSCULAR | Status: AC
  Administered 2022-09-14: 15 mg via INTRAMUSCULAR
  Filled 2022-09-14: qty 1

## 2022-09-14 MED ORDER — PREDNISONE 10 MG PO TABS: 40.0000 mg | ORAL_TABLET | Freq: Every day | ORAL | 0 refills | Status: AC

## 2022-09-14 MED ORDER — CYCLOBENZAPRINE HCL 10 MG PO TABS
5.0000 mg | ORAL_TABLET | Freq: Once | ORAL | Status: AC
  Administered 2022-09-14: 5 mg via ORAL
  Filled 2022-09-14: qty 1

## 2022-09-14 NOTE — ED Triage Notes (Signed)
Pt reports that she began last night having left sided stabbing pain to chest with cough and sob.

## 2022-09-14 NOTE — ED Provider Notes (Signed)
[facility] Provider Note    Event Date/Time   First MD Initiated Contact with Patient 09/14/22 1325     (approximate)   History   Chest Pain, Cough, and Wheezing   HPI  Jacqueline Cooke is a 42 y.o. female with history of asthma, COPD, hypertension presenting to the emergency department for evaluation of chest pain.  Patient reports that yesterday she had onset of a cough with associated left-sided chest pain.  Reports that her chest wall is tender to palpation.  Does report associated shortness of breath.  Reports a dry cough.     Physical Exam   Triage Vital Signs: ED Triage Vitals  Enc Vitals Group     BP 09/14/22 1257 103/89     Pulse Rate 09/14/22 1257 63     Resp 09/14/22 1257 16     Temp 09/14/22 1257 98.1 F (36.7 C)     Temp Source 09/14/22 1257 Oral     SpO2 09/14/22 1257 97 %     Weight 09/14/22 1254 195 lb (88.5 kg)     Height 09/14/22 1254 5\' 9"  (1.753 m)     Head Circumference --      Peak Flow --      Pain Score 09/14/22 1254 10     Pain Loc --      Pain Edu? --      Excl. in [place]? --     Most recent vital signs: Vitals:   09/14/22 1257  BP: 103/89  Pulse: 63  Resp: 16  Temp: 98.1 F (36.7 C)  SpO2: 97%     General: Awake, interactive  CV:  Regular rate, good peripheral perfusion.  Resp:  Lungs clear, unlabored respirations.  Chest wall: Readily reproducible tenderness to palpation over the left chest wall not specifically over the costochondral junction, no overlying skin changes Abd:  Soft, nondistended.  Neuro:  Symmetric facial movement, fluid speech   ED Results / Procedures / Treatments   Labs (all labs ordered are listed, but only abnormal results are displayed) Labs Reviewed  BASIC METABOLIC PANEL  CBC  POC URINE PREG, ED  TROPONIN I (HIGH SENSITIVITY)     EKG EKG independently reviewed interpreted by myself (ER attending) demonstrates:  EKG demonstrates normal sinus rhythm at a rate of 60, PR  138, QRS 80, QTc 408  RADIOLOGY Imaging independently reviewed and interpreted by myself demonstrates:  Chest x-Davyd Podgorski with pneumonia  PROCEDURES:  Critical Care performed: No  Procedures   MEDICATIONS ORDERED IN ED: Medications  lidocaine (LIDODERM) 5 % 1 patch (1 patch Transdermal Patch Applied 09/14/22 1426)  ketorolac (TORADOL) 15 MG/ML injection 15 mg (15 mg Intramuscular Given 09/14/22 1426)  cyclobenzaprine (FLEXERIL) tablet 5 mg (5 mg Oral Given 09/14/22 1426)     IMPRESSION / MDM / ASSESSMENT AND PLAN / ED COURSE  I reviewed the triage vital signs and the nursing notes.  Differential diagnosis includes, but is not limited to, ACS, pneumothorax, viral illness, asthma/COPD flare, pneumonia, pleurisy, musculoskeletal pain  Patient's presentation is most consistent with acute complicated illness / injury requiring diagnostic workup.  42 year old female presenting to the emergency department for evaluation of cough and left-sided chest pain.  X-Jovonte Commins without pneumonia.  Fortunately without significant wheezing on exam here.  Readily reproducible tenderness over the left side of her chest.  Suspect likely musculoskeletal pain with possible component of pleurisy.  Low risk heart score.  Will treat symptomatically with lidocaine patch, Toradol, Flexeril and  reevaluate.  Clinical Course as of 09/14/22 1916  Wed Sep 14, 2022  1512 Patient reevaluated after receiving medications.  She feels much improved.  She is comfortable with discharge home.  While no significant wheezing here, do think there is a possibility for a mild COPD flare.  Will discharge with a short steroid course.  No change in sputum, will hold off on antibiotics.  Will also send naproxen, lidocaine patches, short course of Flexeril to her pharmacy.  Strict return precautions provided.  Patient discharged stable condition. [NR]    Clinical Course User Index [NR] Trinna Post, MD     FINAL CLINICAL IMPRESSION(S) / ED  DIAGNOSES   Final diagnoses:  Chest wall pain  Acute cough     Rx / DC Orders   ED Discharge Orders          Ordered    cyclobenzaprine (FLEXERIL) 10 MG tablet  3 times daily PRN,   Status:  Discontinued        09/14/22 1516    naproxen (NAPROSYN) 500 MG tablet  2 times daily with meals,   Status:  Discontinued        09/14/22 1516    predniSONE (DELTASONE) 10 MG tablet  Daily,   Status:  Discontinued        09/14/22 1518    cyclobenzaprine (FLEXERIL) 10 MG tablet  3 times daily PRN        09/14/22 1538    naproxen (NAPROSYN) 500 MG tablet  2 times daily with meals        09/14/22 1538    predniSONE (DELTASONE) 10 MG tablet  Daily        09/14/22 1538             Note:  This document was prepared using Dragon voice recognition software and may include unintentional dictation errors.   Trinna Post, MD 09/14/22 [phone]

## 2022-09-14 NOTE — Discharge Instructions (Addendum)
You were seen in the Emergency Department today for evaluation of your chest pain. Fortunately, your labs, EKG, and chest x-Khadeem Rockett were overall reassuring against a emergency cause for your pain. Please follow-up with your primary doctor within the next few days for reevaluation.  I have sent a in for a steroid, as well as an anti-inflammatory and pain medicine called naproxen.  In addition, I have sent a prescription for a muscle relaxer that you can use for breakthrough pain.  This does make you drowsy, do not drive or operate machinery when taking this. You can also purchase lidocaine patches over-the-counter to help.Return to the ER for any new or worsening symptoms including worsening chest pain, difficulty breathing, or any other new or concerning symptoms that you believe warrants immediate attention.

## 2022-09-21 ENCOUNTER — Other Ambulatory Visit: Payer: Self-pay

## 2022-09-21 DIAGNOSIS — M25561 Pain in right knee: Secondary | ICD-10-CM | POA: Insufficient documentation

## 2022-09-21 DIAGNOSIS — M79604 Pain in right leg: Secondary | ICD-10-CM | POA: Insufficient documentation

## 2022-09-21 NOTE — ED Triage Notes (Signed)
R leg pain. Reports 2 wks ago had a cortisone injection in her R knee and no relief. States pain now radiating from knee down to ankle and up to her hip. Unknown cause of pain. Denies fall or injury. Reports no relief with RICE or lidocaine patches or NSAIDS at home. Pt alert and oriented. Breathing unlabored speaking in full sentences. Symmetric chest rise and fall.

## 2022-09-22 ENCOUNTER — Emergency Department
Admission: EM | Admit: 2022-09-22 | Discharge: 2022-09-22 | Disposition: A | Discharge status: Home / Self Care | Payer: 59 | Attending: Emergency Medicine | Admitting: Emergency Medicine

## 2022-09-22 ENCOUNTER — Emergency Department: Payer: 59

## 2022-09-22 DIAGNOSIS — M79604 Pain in right leg: Secondary | ICD-10-CM

## 2022-09-22 DIAGNOSIS — M25561 Pain in right knee: Secondary | ICD-10-CM | POA: Diagnosis not present

## 2022-09-22 MED ORDER — KETOROLAC TROMETHAMINE 30 MG/ML IJ SOLN
30.0000 mg | Freq: Once | INTRAMUSCULAR | Status: AC
  Administered 2022-09-22: 30 mg via INTRAMUSCULAR
  Filled 2022-09-22: qty 1

## 2022-09-22 MED ORDER — ACETAMINOPHEN 500 MG PO TABS
1000.0000 mg | ORAL_TABLET | Freq: Once | ORAL | Status: AC
  Administered 2022-09-22: 1000 mg via ORAL
  Filled 2022-09-22: qty 2

## 2022-09-22 MED ORDER — LIDOCAINE 5 % EX PTCH
1.0000 | MEDICATED_PATCH | CUTANEOUS | Status: DC
  Administered 2022-09-22: 1 via TRANSDERMAL
  Filled 2022-09-22: qty 1

## 2022-09-22 MED ORDER — OXYCODONE HCL 5 MG PO TABS: 5.0000 mg | ORAL_TABLET | Freq: Three times a day (TID) | ORAL | 0 refills | Status: DC | PRN

## 2022-09-22 MED ORDER — METHOCARBAMOL 500 MG PO TABS
500.0000 mg | ORAL_TABLET | Freq: Once | ORAL | Status: AC
  Administered 2022-09-22: 500 mg via ORAL
  Filled 2022-09-22: qty 1

## 2022-09-22 MED ORDER — OXYCODONE HCL 5 MG PO TABS
5.0000 mg | ORAL_TABLET | Freq: Once | ORAL | Status: AC
  Administered 2022-09-22: 5 mg via ORAL
  Filled 2022-09-22: qty 1

## 2022-09-22 NOTE — Discharge Instructions (Signed)
Follow-up with the rheumatologist and orthopedist

## 2022-09-22 NOTE — ED Notes (Signed)
To patient's room,pt states she has been in the room for almost 2 hours and her leg is hurting and is asking when the doctor will see her, informed pt that the dr. Has had several other pts to see.  Informed Dr. Katrinka Blazing.

## 2022-09-22 NOTE — ED Provider Notes (Signed)
[facility] Provider Note    Event Date/Time   First MD Initiated Contact with Patient 09/22/22 0101     (approximate)   History   Leg Pain   HPI  Jacqueline Cooke is a 42 y.o. female who presents to the ED for evaluation of Leg Pain   Patient presents with her husband for evaluation of acute on subacute right knee pain.  She was seen at [facility] and had a steroid injection in the right knee, she reports it only improved the pain for a couple weeks and it is now worse again.  She reports nonnarcotic analgesia without improvement of the pain and she reports poor sleep at night tonight so she presents to the ED to get checked out.  No falls or injuries.  No fevers or systemic symptoms.  No swelling.  No numbness or weakness to the leg.  She reports that she has an appointment tomorrow with a rheumatologist.   Physical Exam   Triage Vital Signs: ED Triage Vitals  Enc Vitals Group     BP 09/21/22 2349 116/87     Pulse Rate 09/21/22 2349 77     Resp 09/21/22 2349 18     Temp 09/21/22 2349 98.2 F (36.8 C)     Temp Source 09/21/22 2349 Oral     SpO2 09/21/22 2349 100 %     Weight 09/21/22 2348 195 lb (88.5 kg)     Height 09/21/22 2348 5\' 10"  (1.778 m)     Head Circumference --      Peak Flow --      Pain Score 09/21/22 2348 10     Pain Loc --      Pain Edu? --      Excl. in [place]? --     Most recent vital signs: Vitals:   09/21/22 2349 09/22/22 0454  BP: 116/87 122/84  Pulse: 77 74  Resp: 18 16  Temp: 98.2 F (36.8 C) 98.1 F (36.7 C)  SpO2: 100% 99%    General: Awake, no distress.  CV:  Good peripheral perfusion.  Resp:  Normal effort.  Abd:  No distention.  MSK:  No deformity noted.  No swelling or skin changes or signs of trauma appreciated.  Right foot is neurovascularly intact.  Tenderness of the right knee to palpation and with passive ranging.  No step-offs or signs of trauma.  No laxity. Neuro:  No focal deficits  appreciated. Other:     ED Results / Procedures / Treatments   Labs (all labs ordered are listed, but only abnormal results are displayed) Labs Reviewed - No data to display  EKG   RADIOLOGY Plain film the right knee interpreted by me without evidence of fracture or dislocation. Ultrasound of the right leg interpreted by me without evidence of DVT.  Official radiology report(s): US Venous Img Lower Unilateral Right  Result Date: 09/22/2022 CLINICAL DATA:  Right leg pain and swelling EXAM: RIGHT LOWER EXTREMITY VENOUS DOPPLER ULTRASOUND TECHNIQUE: Gray-scale sonography with compression, as well as color and duplex ultrasound, were performed to evaluate the deep venous system(s) from the level of the common femoral vein through the popliteal and proximal calf veins. COMPARISON:  None Available. FINDINGS: VENOUS Normal compressibility of the common femoral, superficial femoral, and popliteal veins, as well as the visualized calf veins. Visualized portions of profunda femoral vein and great saphenous vein unremarkable. No filling defects to suggest DVT on grayscale or color Doppler imaging. Doppler waveforms  show normal direction of venous flow, normal respiratory plasticity and response to augmentation. Limited views of the contralateral common femoral vein are unremarkable. OTHER None. Limitations: none IMPRESSION: Negative. Electronically Signed   By: Helyn Numbers M.D.   On: 09/22/2022 03:42   DG Knee 2 Views Right  Result Date: 09/22/2022 CLINICAL DATA:  Right knee pain. EXAM: RIGHT KNEE - 1-2 VIEW COMPARISON:  Right knee radiograph dated 12/05/2021. FINDINGS: There is no acute fracture or dislocation. The bones are well mineralized. No arthritic changes. Similar appearance of a small exostosis from the medial proximal tibia. No joint effusion. The soft tissues are unremarkable. IMPRESSION: 1. No acute findings. 2. Small exostosis from the medial proximal tibia. Electronically Signed   By:  Elgie Collard M.D.   On: 09/22/2022 00:28    PROCEDURES and INTERVENTIONS:  Procedures  Medications  lidocaine (LIDODERM) 5 % 1 patch (1 patch Transdermal Patch Applied 09/22/22 0305)  ketorolac (TORADOL) 30 MG/ML injection 30 mg (30 mg Intramuscular Given 09/22/22 0306)  acetaminophen (TYLENOL) tablet 1,000 mg (1,000 mg Oral Given 09/22/22 0306)  oxyCODONE (Oxy IR/ROXICODONE) immediate release tablet 5 mg (5 mg Oral Given 09/22/22 0306)  methocarbamol (ROBAXIN) tablet 500 mg (500 mg Oral Given 09/22/22 0306)     IMPRESSION / MDM / ASSESSMENT AND PLAN / ED COURSE  I reviewed the triage vital signs and the nursing notes.  Differential diagnosis includes, but is not limited to, DVT, fracture, pain seeking or secondary gain, arthritis, septic joint, gout  Patient presents with acute on subacute pain without trauma or evidence of acute pathology and suitable for outpatient management.  She has a previously scheduled rheumatology visit tomorrow and has already seen orthopedics with she says a follow-up next week.  Reassuring exam without signs of ischemia and I doubt DVT and ultrasound is reassuring.  Provided a knee immobilizer and crutches and discharged with analgesia.  Clinical Course as of 09/22/22 0705  Thu Sep 22, 2022  0433 Reassessed.  Feeling better and she is appreciative.  We discussed following up with orthopedist and rheumatologist as scheduled.  Discussed return precautions.  She is asking for a brace and crutches [DS]    Clinical Course User Index [DS] Delton Prairie, MD     FINAL CLINICAL IMPRESSION(S) / ED DIAGNOSES   Final diagnoses:  Right leg pain  Acute pain of right knee     Rx / DC Orders   ED Discharge Orders          Ordered    oxyCODONE (ROXICODONE) 5 MG immediate release tablet  Every 8 hours PRN        09/22/22 0435             Note:  This document was prepared using Dragon voice recognition software and may include unintentional dictation  errors.   Delton Prairie, MD 09/22/22 [phone]

## 2022-10-05 ENCOUNTER — Other Ambulatory Visit: Payer: Self-pay

## 2022-10-05 ENCOUNTER — Emergency Department
Admission: EM | Admit: 2022-10-05 | Discharge: 2022-10-05 | Disposition: A | Discharge status: Home / Self Care | Payer: 59 | Attending: Orthopedic Surgery | Admitting: Orthopedic Surgery

## 2022-10-05 DIAGNOSIS — L03113 Cellulitis of right upper limb: Secondary | ICD-10-CM | POA: Diagnosis not present

## 2022-10-05 DIAGNOSIS — W57XXXA Bitten or stung by nonvenomous insect and other nonvenomous arthropods, initial encounter: Secondary | ICD-10-CM | POA: Diagnosis not present

## 2022-10-05 DIAGNOSIS — S50861A Insect bite (nonvenomous) of right forearm, initial encounter: Secondary | ICD-10-CM | POA: Insufficient documentation

## 2022-10-05 MED ORDER — SULFAMETHOXAZOLE-TRIMETHOPRIM 800-160 MG PO TABS
1.0000 | ORAL_TABLET | Freq: Once | ORAL | Status: AC
  Administered 2022-10-05: 1 via ORAL
  Filled 2022-10-05: qty 1

## 2022-10-05 MED ORDER — SULFAMETHOXAZOLE-TRIMETHOPRIM 800-160 MG PO TABS: 1.0000 | ORAL_TABLET | Freq: Two times a day (BID) | ORAL | 0 refills | Status: DC

## 2022-10-05 NOTE — ED Provider Notes (Signed)
[place] EMERGENCY DEPARTMENT AT [facility] Provider Note   CSN: [id number] Arrival date & time: 10/05/22  2057     History  Chief Complaint  Patient presents with   Insect Bite    Jacqueline Cooke is a 42 y.o. female presents to the emergency department evaluation of insect bite that occurred yesterday.  She has possible spider bite to the right dorsum of the forearm.  She has had increasing redness, swelling.  No fevers.  Pain is mild.  No numbness or tingling throughout the right upper extremity.  HPI     Home Medications Prior to Admission medications   Medication Sig Start Date End Date Taking? Authorizing Provider  sulfamethoxazole-trimethoprim (BACTRIM DS) 800-160 MG tablet Take 1 tablet by mouth 2 (two) times daily. 10/05/22  Yes Evon Slack, PA-C  albuterol (PROVENTIL,VENTOLIN) 90 MCG/ACT inhaler Inhale 2 puffs into the lungs every 6 (six) hours as needed for wheezing. 12/09/10 12/04/11  Antonieta Iba, MD  albuterol (VENTOLIN HFA) 108 (90 Base) MCG/ACT inhaler Inhale 2-4 puffs by mouth every 4 hours as needed for wheezing, cough, and/or shortness of breath 12/27/20   Loleta Rose, MD  albuterol (VENTOLIN HFA) 108 (90 Base) MCG/ACT inhaler Inhale 2 puffs into the lungs every 6 (six) hours as needed for wheezing or shortness of breath. 07/27/21   Jene Every, MD  albuterol (VENTOLIN HFA) 108 (90 Base) MCG/ACT inhaler Inhale 2 puffs into the lungs every 6 (six) hours as needed for wheezing or shortness of breath. 04/22/22   Georga Hacking, MD  albuterol (VENTOLIN HFA) 108 (90 Base) MCG/ACT inhaler Inhale 2 puffs into the lungs every 6 (six) hours as needed for wheezing or shortness of breath. 05/29/22   Cuthriell, Delorise Royals, PA-C  benzonatate (TESSALON PERLES) 100 MG capsule Take 1 capsule (100 mg total) by mouth 3 (three) times daily as needed for cough. 05/29/22 05/29/23  Cuthriell, Delorise Royals, PA-C  brompheniramine-pseudoephedrine-DM 30-2-10 MG/5ML  syrup Take 10 mLs by mouth 4 (four) times daily as needed. 05/29/22   Cuthriell, Delorise Royals, PA-C  budesonide-formoterol (SYMBICORT) 80-4.5 MCG/ACT inhaler Inhale 2 puffs into the lungs in the morning and at bedtime. 04/22/22   Georga Hacking, MD  chlorpheniramine-HYDROcodone [facility] PENNKINETIC ER) 10-8 MG/5ML SUER Take 5 mLs by mouth every 12 (twelve) hours as needed for cough. 12/27/20   Loleta Rose, MD  doxycycline (VIBRAMYCIN) 100 MG capsule Take 1 capsule (100 mg total) by mouth 2 (two) times daily. 02/15/22   Raspet, Erin K, PA-C  guaiFENesin-codeine 100-10 MG/5ML syrup Take 5 mLs by mouth every 6 (six) hours as needed for cough. 06/22/22   Minna Antis, MD  hydrOXYzine (ATARAX/VISTARIL) 25 MG tablet hydroxyzine HCl 25 mg tablet  TAKE 1 TO 2 TABLETS BY MOUTH EVERY DAY AT BEDTIME AS NEEDED    [provider]  ipratropium-albuterol (DUONEB) 0.5-2.5 (3) MG/3ML SOLN Take 3 mLs by nebulization every 6 (six) hours as needed. 02/15/22   Raspet, Noberto Retort, PA-C  magic mouthwash w/lidocaine SOLN Take 5 mLs by mouth 4 (four) times daily as needed for mouth pain. Swish and spit, do not swallow the solution. 12/27/20   Loleta Rose, MD  meloxicam (MOBIC) 15 MG tablet Take 1 tablet every day by oral route with meals. 02/03/22   [provider]  methocarbamol (ROBAXIN) 500 MG tablet Take 1 tablet (500 mg total) by mouth every 8 (eight) hours as needed for muscle spasms. 04/15/22   Jene Every, MD  methylPREDNISolone (  MEDROL DOSEPAK) 4 MG TBPK tablet Take as instructed 04/17/22   Radford Pax, NP  mirtazapine (REMERON) 15 MG tablet mirtazapine 15 mg tablet  TAKE 1 TABLET BY MOUTH EVERY DAY AT BEDTIME    [provider]  norethindrone (MICRONOR) 0.35 MG tablet Take 1 tablet (0.35 mg total) by mouth daily. 08/25/20   Tresea Mall, CNM  ondansetron (ZOFRAN-ODT) 4 MG disintegrating tablet Take 1 tablet (4 mg total) by mouth every 6 (six) hours as needed for nausea or  vomiting. 02/07/22   Eusebio Friendly B, PA-C  oxyCODONE (ROXICODONE) 5 MG immediate release tablet Take 1 tablet (5 mg total) by mouth every 8 (eight) hours as needed. 09/22/22 09/22/23  Delton Prairie, MD  traMADol (ULTRAM) 50 MG tablet Take 1 tablet (50 mg total) by mouth every 6 (six) hours as needed. 12/05/21   Fisher, Roselyn Bering, PA-C  valACYclovir (VALTREX) 1000 MG tablet Take 1,000 mg by mouth 3 (three) times daily. 07/01/20   [provider]      Allergies    Penicillins cross reactors and Penicillins    Review of Systems   Review of Systems  Physical Exam Updated Vital Signs BP 107/81   Pulse 78   Temp 98.6 F (37 C) (Oral)   Resp 18   Wt 88.5 kg   LMP 09/28/2022 (Exact Date)   SpO2 100%   BMI 27.98 kg/m  Physical Exam Constitutional:      Appearance: She is well-developed.  HENT:     Head: Normocephalic and atraumatic.  Eyes:     Conjunctiva/sclera: Conjunctivae normal.  Cardiovascular:     Rate and Rhythm: Normal rate.  Pulmonary:     Effort: Pulmonary effort is normal. No respiratory distress.  Musculoskeletal:        General: Normal range of motion.     Cervical back: Normal range of motion.     Comments: Right upper extremity with 2 cm wide area of induration and swelling on the dorsum of the forearm.  There is a central area of irritation in the middle of the redness consistent with bite mark.  No fluctuance or drainage.   Skin:    General: Skin is warm.     Findings: No rash.  Neurological:     Mental Status: She is alert and oriented to person, place, and time.  Psychiatric:        Behavior: Behavior normal.        Thought Content: Thought content normal.     ED Results / Procedures / Treatments   Labs (all labs ordered are listed, but only abnormal results are displayed) Labs Reviewed - No data to display  EKG None  Radiology No results found.  Procedures Procedures    Medications Ordered in ED Medications  sulfamethoxazole-trimethoprim  (BACTRIM DS) 800-160 MG per tablet 1 tablet (has no administration in time range)    ED Course/ Medical Decision Making/ A&P                             Medical Decision Making Risk Prescription drug management.   42 year old female with insect bite to the right dorsum of the forearm.  Bite occurred yesterday.  She has had some increase in redness, swelling.  No drainage.  On exam no fluctuance.  Vital signs stable, afebrile.  Compartments are soft.  No numbness or tingling. Final Clinical Impression(s) / ED Diagnoses Final diagnoses:  Insect bite of right  forearm, initial encounter  Cellulitis of right upper extremity    Rx / DC Orders ED Discharge Orders          Ordered    sulfamethoxazole-trimethoprim (BACTRIM DS) 800-160 MG tablet  2 times daily        10/05/22 2147              Ronnette Juniper 10/05/22 2204    Georga Hacking, MD 10/06/22 2348

## 2022-10-05 NOTE — ED Triage Notes (Signed)
Pt presents to ER with c/o poss spider bite to right distal forearm that happened last night.  Pt reports feeling a small pinch type pain, and did not think much of it.  Pt did not see any spider or insect.  Today, pt states the redness has started to become worse and spread up her arm.  Very small amt of redness seen on exam.  Pt denies any other symptoms.  Pt otherwise A&O x4 and in NAD.

## 2022-10-05 NOTE — Discharge Instructions (Signed)
Continue with hydrocortisone cream.  Take antibiotic as prescribed.  Return for any increasing pain swelling warmth redness or fevers.

## 2022-10-06 DIAGNOSIS — R768 Other specified abnormal immunological findings in serum: Secondary | ICD-10-CM | POA: Diagnosis not present

## 2022-10-06 DIAGNOSIS — G8929 Other chronic pain: Secondary | ICD-10-CM | POA: Diagnosis not present

## 2022-10-06 DIAGNOSIS — M25561 Pain in right knee: Secondary | ICD-10-CM | POA: Diagnosis not present

## 2022-10-10 DIAGNOSIS — M2391 Unspecified internal derangement of right knee: Secondary | ICD-10-CM | POA: Diagnosis not present

## 2022-10-17 ENCOUNTER — Other Ambulatory Visit: Payer: Self-pay

## 2022-10-17 ENCOUNTER — Encounter: Payer: Self-pay | Admitting: Emergency Medicine

## 2022-10-17 DIAGNOSIS — J449 Chronic obstructive pulmonary disease, unspecified: Secondary | ICD-10-CM | POA: Diagnosis not present

## 2022-10-17 DIAGNOSIS — Z7951 Long term (current) use of inhaled steroids: Secondary | ICD-10-CM | POA: Diagnosis not present

## 2022-10-17 DIAGNOSIS — J45909 Unspecified asthma, uncomplicated: Secondary | ICD-10-CM | POA: Diagnosis not present

## 2022-10-17 DIAGNOSIS — M5441 Lumbago with sciatica, right side: Secondary | ICD-10-CM | POA: Insufficient documentation

## 2022-10-17 DIAGNOSIS — I10 Essential (primary) hypertension: Secondary | ICD-10-CM | POA: Insufficient documentation

## 2022-10-17 DIAGNOSIS — M545 Low back pain, unspecified: Secondary | ICD-10-CM | POA: Diagnosis not present

## 2022-10-17 NOTE — ED Triage Notes (Signed)
Pt to triage via w/c with no distress noted; pt reports pain to rt hip radiating down rt leg that began today; denies any back pain; denies any injury but st has been seen recently at emerg ortho for rt knee pain

## 2022-10-18 ENCOUNTER — Emergency Department
Admission: EM | Admit: 2022-10-18 | Discharge: 2022-10-18 | Disposition: A | Discharge status: Home / Self Care | Payer: 59 | Attending: Emergency Medicine | Admitting: Emergency Medicine

## 2022-10-18 DIAGNOSIS — M5431 Sciatica, right side: Secondary | ICD-10-CM

## 2022-10-18 DIAGNOSIS — M5441 Lumbago with sciatica, right side: Secondary | ICD-10-CM | POA: Diagnosis not present

## 2022-10-18 MED ORDER — LIDOCAINE 5 % EX PTCH
1.0000 | MEDICATED_PATCH | CUTANEOUS | Status: DC
  Administered 2022-10-18: 1 via TRANSDERMAL
  Filled 2022-10-18: qty 1

## 2022-10-18 MED ORDER — KETOROLAC TROMETHAMINE 60 MG/2ML IM SOLN
30.0000 mg | Freq: Once | INTRAMUSCULAR | Status: AC
  Administered 2022-10-18: 30 mg via INTRAMUSCULAR
  Filled 2022-10-18: qty 2

## 2022-10-18 MED ORDER — OXYCODONE-ACETAMINOPHEN 5-325 MG PO TABS: 1.0000 | ORAL_TABLET | ORAL | 0 refills | Status: DC | PRN

## 2022-10-18 MED ORDER — DIAZEPAM 2 MG PO TABS: 2.0000 mg | ORAL_TABLET | Freq: Three times a day (TID) | ORAL | 0 refills | Status: DC | PRN

## 2022-10-18 MED ORDER — PREDNISONE 20 MG PO TABS
30.0000 mg | ORAL_TABLET | Freq: Once | ORAL | Status: AC
  Administered 2022-10-18: 30 mg via ORAL
  Filled 2022-10-18: qty 1

## 2022-10-18 MED ORDER — OXYCODONE-ACETAMINOPHEN 5-325 MG PO TABS
1.0000 | ORAL_TABLET | Freq: Once | ORAL | Status: AC
  Administered 2022-10-18: 1 via ORAL
  Filled 2022-10-18: qty 1

## 2022-10-18 MED ORDER — METHYLPREDNISOLONE 4 MG PO TBPK: ORAL_TABLET | ORAL | 0 refills | Status: DC

## 2022-10-18 MED ORDER — LIDOCAINE 5 % EX PTCH: 1.0000 | MEDICATED_PATCH | CUTANEOUS | 0 refills | Status: DC

## 2022-10-18 MED ORDER — DIAZEPAM 2 MG PO TABS
2.0000 mg | ORAL_TABLET | Freq: Once | ORAL | Status: AC
  Administered 2022-10-18: 2 mg via ORAL
  Filled 2022-10-18: qty 1

## 2022-10-18 NOTE — ED Provider Notes (Signed)
[facility] Provider Note    Event Date/Time   First MD Initiated Contact with Patient 10/18/22 0104     (approximate)   History   Leg Pain   HPI  Jacqueline Cooke is a 42 y.o. female who presents to the ED from home with a chief complaint of low back pain radiating to her right hip and down her right leg which began yesterday.  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     Past Medical History   Past Medical History:  Diagnosis Date   Anemia    Asthma    COPD (chronic obstructive pulmonary disease) (HCC)    Depression    Hyperphosphatemia    Hypertension    Leukocytosis    Substance abuse (HCC)    History of Cocaine abuse   Tachycardia      Active Problem List   Patient Active Problem List   Diagnosis Date Noted   Bipolar depression (HCC) 08/25/2020   Moderate persistent asthma without complication 09/25/2018   Migraine without aura and without status migrainosus, not intractable 01/28/2018   Tobacco abuse 01/28/2018   History of tracheostomy 12/20/2013   H/O: substance abuse (HCC) 08/24/2012   Tachycardia 12/09/2010   SOB (shortness of breath) 12/09/2010     Past Surgical History   Past Surgical History:  Procedure Laterality Date   CESAREAN SECTION  01/04/2013   DILATION AND CURETTAGE OF UTERUS  2012   [facility]  2013 [facility]   TRACHEOSTOMY  10/2010     Home Medications   Prior to Admission medications   Medication Sig Start Date End Date Taking? Authorizing Provider  diazepam (VALIUM) 2 MG tablet Take 1 tablet (2 mg total) by mouth every 8 (eight) hours as needed for muscle spasms. 10/18/22  Yes Irean Hong, MD  lidocaine (LIDODERM) 5 % Place 1 patch onto the skin daily. Remove & Discard patch within 12 hours or as directed by MD 10/18/22  Yes Irean Hong, MD  methylPREDNISolone (MEDROL DOSEPAK) 4 MG  TBPK tablet Take as directed 10/18/22  Yes Irean Hong, MD  oxyCODONE-acetaminophen (PERCOCET/ROXICET) 5-325 MG tablet Take 1 tablet by mouth every 4 (four) hours as needed for severe pain. 10/18/22  Yes Irean Hong, MD  albuterol (PROVENTIL,VENTOLIN) 90 MCG/ACT inhaler Inhale 2 puffs into the lungs every 6 (six) hours as needed for wheezing. 12/09/10 12/04/11  Antonieta Iba, MD  albuterol (VENTOLIN HFA) 108 (90 Base) MCG/ACT inhaler Inhale 2-4 puffs by mouth every 4 hours as needed for wheezing, cough, and/or shortness of breath 12/27/20   Loleta Rose, MD  albuterol (VENTOLIN HFA) 108 (90 Base) MCG/ACT inhaler Inhale 2 puffs into the lungs every 6 (six) hours as needed for wheezing or shortness of breath. 07/27/21   Jene Every, MD  albuterol (VENTOLIN HFA) 108 (90 Base) MCG/ACT inhaler Inhale 2 puffs into the lungs every 6 (six) hours as needed for wheezing or shortness of breath. 04/22/22   Georga Hacking, MD  albuterol (VENTOLIN HFA) 108 (90 Base) MCG/ACT inhaler Inhale 2 puffs into the lungs every 6 (six) hours as needed for wheezing or shortness of breath. 05/29/22   Cuthriell, Delorise Royals, PA-C  benzonatate (TESSALON PERLES) 100 MG capsule Take 1 capsule (100 mg total) by mouth 3 (three) times daily as needed for cough. 05/29/22 05/29/23  Cuthriell, Delorise Royals, PA-C  brompheniramine-pseudoephedrine-DM 30-2-10 MG/5ML syrup Take 10 mLs by mouth 4 (four) times daily as needed. 05/29/22   Cuthriell, Delorise Royals, PA-C  budesonide-formoterol (SYMBICORT) 80-4.5 MCG/ACT inhaler Inhale 2 puffs into the lungs in the morning and at bedtime. 04/22/22   Georga Hacking, MD  chlorpheniramine-HYDROcodone [facility] PENNKINETIC ER) 10-8 MG/5ML SUER Take 5 mLs by mouth every 12 (twelve) hours as needed for cough. 12/27/20   Loleta Rose, MD  doxycycline (VIBRAMYCIN) 100 MG capsule Take 1 capsule (100 mg total) by mouth 2 (two) times daily. 02/15/22   Raspet, Erin K, PA-C  guaiFENesin-codeine 100-10 MG/5ML  syrup Take 5 mLs by mouth every 6 (six) hours as needed for cough. 06/22/22   Minna Antis, MD  hydrOXYzine (ATARAX/VISTARIL) 25 MG tablet hydroxyzine HCl 25 mg tablet  TAKE 1 TO 2 TABLETS BY MOUTH EVERY DAY AT BEDTIME AS NEEDED    [provider]  ipratropium-albuterol (DUONEB) 0.5-2.5 (3) MG/3ML SOLN Take 3 mLs by nebulization every 6 (six) hours as needed. 02/15/22   Raspet, Noberto Retort, PA-C  magic mouthwash w/lidocaine SOLN Take 5 mLs by mouth 4 (four) times daily as needed for mouth pain. Swish and spit, do not swallow the solution. 12/27/20   Loleta Rose, MD  meloxicam (MOBIC) 15 MG tablet Take 1 tablet every day by oral route with meals. 02/03/22   [provider]  methocarbamol (ROBAXIN) 500 MG tablet Take 1 tablet (500 mg total) by mouth every 8 (eight) hours as needed for muscle spasms. 04/15/22   Jene Every, MD  mirtazapine (REMERON) 15 MG tablet mirtazapine 15 mg tablet  TAKE 1 TABLET BY MOUTH EVERY DAY AT BEDTIME    [provider]  norethindrone (MICRONOR) 0.35 MG tablet Take 1 tablet (0.35 mg total) by mouth daily. 08/25/20   Tresea Mall, CNM  ondansetron (ZOFRAN-ODT) 4 MG disintegrating tablet Take 1 tablet (4 mg total) by mouth every 6 (six) hours as needed for nausea or vomiting. 02/07/22   Eusebio Friendly B, PA-C  oxyCODONE (ROXICODONE) 5 MG immediate release tablet Take 1 tablet (5 mg total) by mouth every 8 (eight) hours as needed. 09/22/22 09/22/23  Delton Prairie, MD  sulfamethoxazole-trimethoprim (BACTRIM DS) 800-160 MG tablet Take 1 tablet by mouth 2 (two) times daily. 10/05/22   Evon Slack, PA-C  traMADol (ULTRAM) 50 MG tablet Take 1 tablet (50 mg total) by mouth every 6 (six) hours as needed. 12/05/21   Fisher, Roselyn Bering, PA-C  valACYclovir (VALTREX) 1000 MG tablet Take 1,000 mg by mouth 3 (three) times daily. 07/01/20   [provider]     Allergies  Penicillins cross reactors and Penicillins   Family History   Family History   Family history unknown: Yes     Physical Exam  Triage Vital Signs: ED Triage Vitals [10/17/22 2332]  Enc Vitals Group     BP 107/77     Pulse Rate 69     Resp 18     Temp 97.8 F (36.6 C)     Temp Source Oral     SpO2 100 %     Weight 195 lb (88.5 kg)     Height 5\' 9"  (1.753 m)     Head Circumference      Peak Flow      Pain Score 10     Pain Loc      Pain Edu?      Excl. in GC?     Updated Vital Signs: BP 107/77 (BP Location: Right  Arm)   Pulse 69   Temp 97.8 F (36.6 C) (Oral)   Resp 18   Ht 5\' 9"  (1.753 m)   Wt 88.5 kg   LMP 09/28/2022 (Exact Date)   SpO2 100%   BMI 28.80 kg/m    General: Awake, no distress.  CV:  Good peripheral perfusion.  Resp:  Normal effort.  Abd:  No distention.  Other:  No lumbar spinal tenderness to palpation.  Right paraspinal muscle spasms.  Tender to palpation right buttock.  Negative straight leg raise.  5/5 motor strength and sensation all extremities.   ED Results / Procedures / Treatments  Labs (all labs ordered are listed, but only abnormal results are displayed) Labs Reviewed - No data to display   EKG  None   RADIOLOGY None   Official radiology report(s): No results found.   PROCEDURES:  Critical Care performed: No  Procedures   MEDICATIONS ORDERED IN ED: Medications  ketorolac (TORADOL) injection 30 mg (has no administration in time range)  oxyCODONE-acetaminophen (PERCOCET/ROXICET) 5-325 MG per tablet 1 tablet (has no administration in time range)  diazepam (VALIUM) tablet 2 mg (has no administration in time range)  predniSONE (DELTASONE) tablet 30 mg (has no administration in time range)  lidocaine (LIDODERM) 5 % 1 patch (has no administration in time range)     IMPRESSION / MDM / ASSESSMENT AND PLAN / ED COURSE  I reviewed the triage vital signs and the nursing notes.                             42 year old female presenting with right lower back pain shooting into her hip and leg.   Symptoms clinically consistent with sciatica.  No focal neurological deficits.  She is already taking meloxicam.  In the ED, will administer IM ketorolac, start steroid taper, add lidocaine patch, Percocet and Valium for pain and muscle relaxation.  Will discharge home on steroid taper, lidocaine patch, Percocet and Valium to use as needed.  Strict return precautions given.  Patient and spouse verbalized understanding agree with plan of care.  Patient's presentation is most consistent with acute, uncomplicated illness.   FINAL CLINICAL IMPRESSION(S) / ED DIAGNOSES   Final diagnoses:  Sciatica of right side     Rx / DC Orders   ED Discharge Orders          Ordered    methylPREDNISolone (MEDROL DOSEPAK) 4 MG TBPK tablet        10/18/22 0116    lidocaine (LIDODERM) 5 %  Every 24 hours        10/18/22 0116    oxyCODONE-acetaminophen (PERCOCET/ROXICET) 5-325 MG tablet  Every 4 hours PRN        10/18/22 0116    diazepam (VALIUM) 2 MG tablet  Every 8 hours PRN        10/18/22 0116             Note:  This document was prepared using Dragon voice recognition software and may include unintentional dictation errors.   Irean Hong, MD 10/18/22 [phone]

## 2022-10-18 NOTE — Discharge Instructions (Addendum)
Continue Meloxicam as already prescribed by your doctor.  Take and finish steroid taper as prescribed.  You may take Percocet and Valium as needed for pain and muscle spasms.  Apply Lidocaine patch daily as directed.  Return to the ER for worsening symptoms, persistent vomiting, difficulty breathing, losing control of your bowel or bladder, extremity weakness or other concerns.

## 2022-11-02 DIAGNOSIS — M659 Synovitis and tenosynovitis, unspecified: Secondary | ICD-10-CM | POA: Diagnosis not present

## 2022-11-02 DIAGNOSIS — M1711 Unilateral primary osteoarthritis, right knee: Secondary | ICD-10-CM | POA: Diagnosis not present

## 2022-11-05 ENCOUNTER — Emergency Department: Payer: 59

## 2022-11-05 ENCOUNTER — Emergency Department
Admission: EM | Admit: 2022-11-05 | Discharge: 2022-11-05 | Disposition: A | Discharge status: Home / Self Care | Payer: 59 | Attending: Student in an Organized Health Care Education/Training Program | Admitting: Student in an Organized Health Care Education/Training Program

## 2022-11-05 ENCOUNTER — Encounter: Payer: Self-pay | Admitting: Radiology

## 2022-11-05 ENCOUNTER — Other Ambulatory Visit: Payer: Self-pay

## 2022-11-05 DIAGNOSIS — C569 Malignant neoplasm of unspecified ovary: Secondary | ICD-10-CM | POA: Diagnosis not present

## 2022-11-05 DIAGNOSIS — A599 Trichomoniasis, unspecified: Secondary | ICD-10-CM | POA: Insufficient documentation

## 2022-11-05 DIAGNOSIS — J45909 Unspecified asthma, uncomplicated: Secondary | ICD-10-CM | POA: Diagnosis not present

## 2022-11-05 DIAGNOSIS — R102 Pelvic and perineal pain: Secondary | ICD-10-CM | POA: Diagnosis not present

## 2022-11-05 DIAGNOSIS — I10 Essential (primary) hypertension: Secondary | ICD-10-CM | POA: Insufficient documentation

## 2022-11-05 DIAGNOSIS — J449 Chronic obstructive pulmonary disease, unspecified: Secondary | ICD-10-CM | POA: Insufficient documentation

## 2022-11-05 DIAGNOSIS — B9689 Other specified bacterial agents as the cause of diseases classified elsewhere: Secondary | ICD-10-CM | POA: Diagnosis not present

## 2022-11-05 DIAGNOSIS — N760 Acute vaginitis: Secondary | ICD-10-CM

## 2022-11-05 DIAGNOSIS — N83202 Unspecified ovarian cyst, left side: Secondary | ICD-10-CM | POA: Diagnosis not present

## 2022-11-05 DIAGNOSIS — A5901 Trichomonal vulvovaginitis: Secondary | ICD-10-CM | POA: Diagnosis not present

## 2022-11-05 DIAGNOSIS — R1013 Epigastric pain: Secondary | ICD-10-CM | POA: Diagnosis not present

## 2022-11-05 LAB — WET PREP, GENITAL
Sperm: NONE SEEN
WBC, Wet Prep HPF POC: <10 (ref <10)
Yeast Wet Prep HPF POC: NONE SEEN

## 2022-11-05 LAB — CBC
HCT: 40.5 % (ref 36.0–46.0)
Hemoglobin: 13.7 g/dL (ref 12.0–15.0)
MCH: 32.4 pg (ref 26.0–34.0)
MCHC: 33.8 g/dL (ref 30.0–36.0)
MCV: 95.7 fL (ref 80.0–100.0)
Platelets: 221 10*3/uL (ref 150–400)
RBC: 4.23 MIL/uL (ref 3.87–5.11)
RDW: 12.5 % (ref 11.5–15.5)
WBC: 6.8 10*3/uL (ref 4.0–10.5)
nRBC: 0 % (ref 0.0–0.2)

## 2022-11-05 LAB — COMPREHENSIVE METABOLIC PANEL
ALT: 35 U/L (ref 0–44)
AST: 16 U/L (ref 15–41)
Albumin: 4.3 g/dL (ref 3.5–5.0)
Alkaline Phosphatase: 55 U/L (ref 38–126)
Anion gap: 6 (ref 5–15)
BUN: 19 mg/dL (ref 6–20)
CO2: 23 mmol/L (ref 22–32)
Calcium: 8.6 mg/dL — ABNORMAL LOW (ref 8.9–10.3)
Chloride: 108 mmol/L (ref 98–111)
Creatinine, Ser: 0.66 mg/dL (ref 0.44–1.00)
GFR, Estimated: >60 mL/min (ref >60)
Glucose, Bld: 94 mg/dL (ref 70–99)
Potassium: 3.9 mmol/L (ref 3.5–5.1)
Sodium: 137 mmol/L (ref 135–145)
Total Bilirubin: 0.7 mg/dL (ref 0.3–1.2)
Total Protein: 6.9 g/dL (ref 6.5–8.1)

## 2022-11-05 LAB — URINALYSIS, ROUTINE W REFLEX MICROSCOPIC
Bilirubin Urine: NEGATIVE
Glucose, UA: NEGATIVE mg/dL
Ketones, ur: NEGATIVE mg/dL
Nitrite: NEGATIVE
Protein, ur: 100 mg/dL — AB
RBC / HPF: >50 RBC/hpf (ref 0–5)
Specific Gravity, Urine: 1.032 — ABNORMAL HIGH (ref 1.005–1.030)
pH: 7 (ref 5.0–8.0)

## 2022-11-05 LAB — LIPASE, BLOOD: Lipase: 25 U/L (ref 11–51)

## 2022-11-05 LAB — CHLAMYDIA/NGC RT PCR (ARMC ONLY)
Chlamydia Tr: NOT DETECTED
N gonorrhoeae: NOT DETECTED

## 2022-11-05 LAB — POC URINE PREG, ED: Preg Test, Ur: NEGATIVE

## 2022-11-05 MED ORDER — METRONIDAZOLE 500 MG PO TABS: 500.0000 mg | ORAL_TABLET | Freq: Two times a day (BID) | ORAL | 0 refills | Status: AC

## 2022-11-05 MED ORDER — METRONIDAZOLE 500 MG PO TABS
500.0000 mg | ORAL_TABLET | Freq: Once | ORAL | Status: AC
  Administered 2022-11-05: 500 mg via ORAL
  Filled 2022-11-05: qty 1

## 2022-11-05 NOTE — ED Provider Notes (Signed)
[facility] Emergency Department Provider Note     Event Date/Time   First MD Initiated Contact with Patient 11/05/22 1558     (approximate)   History   Abdominal Pain   HPI  Jacqueline Cooke is a 42 y.o. female with a history of HTN, COPD, asthma, anemia, and depression, presents to the ED for evaluation of normal uterine bleeding.  Patient reports intermittent spotting described as brownish discharge since June 21.  She presented herself to local urgent care, and was asked to report to the ED for further evaluation.  According to the chart note, the patient had epigastric and abdominal pelvic tenderness on palpation.  Patient denies any fevers, chills, sweats, chest pain, shortness of breath.     Physical Exam   Triage Vital Signs: ED Triage Vitals  Enc Vitals Group     BP 11/05/22 1352 129/88     Pulse Rate 11/05/22 1352 70     Resp 11/05/22 1352 18     Temp 11/05/22 1352 98 F (36.7 C)     Temp Source 11/05/22 1352 Oral     SpO2 11/05/22 1352 100 %     Weight 11/05/22 1351 169 lb (76.7 kg)     Height 11/05/22 1351 5\' 9"  (1.753 m)     Head Circumference --      Peak Flow --      Pain Score --      Pain Loc --      Pain Edu? --      Excl. in GC? --     Most recent vital signs: Vitals:   11/05/22 1352  BP: 129/88  Pulse: 70  Resp: 18  Temp: 98 F (36.7 C)  SpO2: 100%    General Awake, no distress. NAd CV:  Good peripheral perfusion.  RESP:  Normal effort.  ABD:  No distention. Soft, mildly tender to palp over the abdominopelvic region. GU:  Normal external genitalia.  Dark blood noted in the vault.  No CMT or Nexa masses appreciated.    ED Results / Procedures / Treatments   Labs (all labs ordered are listed, but only abnormal results are displayed) Labs Reviewed  WET PREP, GENITAL - Abnormal; Notable for the following components:      Result Value   Trich, Wet Prep PRESENT (*)    Clue Cells Wet Prep HPF POC  PRESENT (*)    All other components within normal limits  COMPREHENSIVE METABOLIC PANEL - Abnormal; Notable for the following components:   Calcium 8.6 (*)    All other components within normal limits  URINALYSIS, ROUTINE W REFLEX MICROSCOPIC - Abnormal; Notable for the following components:   Color, Urine AMBER (*)    APPearance HAZY (*)    Specific Gravity, Urine 1.032 (*)    Hgb urine dipstick MODERATE (*)    Protein, ur 100 (*)    Leukocytes,Ua TRACE (*)    Bacteria, UA RARE (*)    All other components within normal limits  CHLAMYDIA/NGC RT PCR ([facility] ONLY)            LIPASE, BLOOD  CBC  POC URINE PREG, ED     EKG  RADIOLOGY  I personally viewed and evaluated these images as part of my medical decision making, as well as reviewing the written report by the radiologist.  ED Provider Interpretation: No acute findings  US PELVIC COMPLETE W TRANSVAGINAL AND TORSION R/O  Result Date: 11/05/2022 CLINICAL DATA:  Pelvic pain EXAM: TRANSABDOMINAL AND TRANSVAGINAL ULTRASOUND OF PELVIS DOPPLER ULTRASOUND OF OVARIES TECHNIQUE: Both transabdominal and transvaginal ultrasound examinations of the pelvis were performed. Transabdominal technique was performed for global imaging of the pelvis including uterus, ovaries, adnexal regions, and pelvic cul-de-sac. It was necessary to proceed with endovaginal exam following the transabdominal exam to visualize the endometrium and ovaries. Color and duplex Doppler ultrasound was utilized to evaluate blood flow to the ovaries. COMPARISON:  10/20/2019 FINDINGS: Uterus Measurements: 8.7 x 4.5 x 5.7 cm = volume: 115 mL. No fibroids or other mass visualized. Endometrium Thickness: 6 mm in thickness.  No focal abnormality visualized. Right ovary Measurements: 2.8 x 1.9 x 1.8 cm = volume: 5 mL. 1.7 cm dominant follicle. Normal appearance. No adnexal mass. Left ovary Measurements: 3.7 x 2.2 x 3.3 cm = volume: 17 ML. 3 cm simple ovarian cyst. No adnexal mass. Pulsed  Doppler evaluation of both ovaries demonstrates normal low-resistance arterial and venous waveforms. Other findings No abnormal free fluid. IMPRESSION: No acute findings or significant abnormality. No evidence of ovarian torsion. 3 cm left ovarian cyst. No followup imaging recommended. Note: This recommendation does not apply to premenarchal patients or to those with increased risk (genetic, family history, elevated tumor markers or other high-risk factors) of ovarian cancer. Reference: Radiology 2019 Nov; 293(2):359-371. Electronically Signed   By: Charlett Nose M.D.   On: 11/05/2022 18:25     PROCEDURES:  Critical Care performed: No  Procedures   MEDICATIONS ORDERED IN ED: Medications  metroNIDAZOLE (FLAGYL) tablet 500 mg (500 mg Oral Given 11/05/22 1834)     IMPRESSION / MDM / ASSESSMENT AND PLAN / ED COURSE  I reviewed the triage vital signs and the nursing notes.                              Differential diagnosis includes, but is not limited to, ovarian cyst, ovarian torsion, acute appendicitis, diverticulitis, urinary tract infection/pyelonephritis, bowel obstruction, colitis, renal colic, gastroenteritis, hernia, fibroids, endometriosis, pregnancy related pain including ectopic pregnancy, etc.   Patient's presentation is most consistent with acute complicated illness / injury requiring diagnostic workup.  Patient's diagnosis is consistent with pelvic pain secondary to BV and trichomoniasis. Patient will be discharged home with prescriptions for metronidazole.  No significant lab abnormalities appreciated.  No UA evidence of an acute UTI.  Patient's pelvic ultrasound was otherwise within normal limits except a small 3 cm ovarian cyst, that does not require any additional subsequent follow-up.  Patient is to follow up with her primary provider as needed or otherwise directed. Patient is given ED precautions to return to the ED for any worsening or new symptoms.     FINAL CLINICAL  IMPRESSION(S) / ED DIAGNOSES   Final diagnoses:  BV (bacterial vaginosis)  Trichomoniasis     Rx / DC Orders   ED Discharge Orders          Ordered    metroNIDAZOLE (FLAGYL) 500 MG tablet  2 times daily        11/05/22 1836             Note:  This document was prepared using Dragon voice recognition software and may include unintentional dictation errors.    Lissa Hoard, PA-C 11/06/22 0002    Willy Eddy, MD 11/06/22 2004
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
## 2022-11-05 NOTE — Discharge Instructions (Signed)
Your ultrasound is of normal and reassuring.  Does show a small ovarian cyst.  Your symptoms are likely due to an acute vaginitis.  Take the antibiotic as prescribed.  Your intimate partner should be tested and treated before engaging in sex again.

## 2022-11-08 ENCOUNTER — Ambulatory Visit: Payer: Self-pay

## 2022-11-11 ENCOUNTER — Emergency Department: Payer: No Typology Code available for payment source

## 2022-11-11 ENCOUNTER — Emergency Department
Admission: EM | Admit: 2022-11-11 | Discharge: 2022-11-11 | Disposition: A | Discharge status: Home / Self Care | Payer: No Typology Code available for payment source | Attending: Emergency Medicine | Admitting: Emergency Medicine

## 2022-11-11 ENCOUNTER — Other Ambulatory Visit: Payer: Self-pay

## 2022-11-11 DIAGNOSIS — M25512 Pain in left shoulder: Secondary | ICD-10-CM | POA: Diagnosis not present

## 2022-11-11 DIAGNOSIS — S4992XA Unspecified injury of left shoulder and upper arm, initial encounter: Secondary | ICD-10-CM | POA: Diagnosis not present

## 2022-11-11 DIAGNOSIS — Y92410 Unspecified street and highway as the place of occurrence of the external cause: Secondary | ICD-10-CM | POA: Insufficient documentation

## 2022-11-11 DIAGNOSIS — I10 Essential (primary) hypertension: Secondary | ICD-10-CM | POA: Insufficient documentation

## 2022-11-11 DIAGNOSIS — M25511 Pain in right shoulder: Secondary | ICD-10-CM

## 2022-11-11 MED ORDER — KETOROLAC TROMETHAMINE 15 MG/ML IJ SOLN
15.0000 mg | Freq: Once | INTRAMUSCULAR | Status: AC
  Administered 2022-11-11: 15 mg via INTRAMUSCULAR
  Filled 2022-11-11: qty 1

## 2022-11-11 MED ORDER — ACETAMINOPHEN 500 MG PO TABS
1000.0000 mg | ORAL_TABLET | Freq: Once | ORAL | Status: AC
  Administered 2022-11-11: 1000 mg via ORAL
  Filled 2022-11-11: qty 2

## 2022-11-11 MED ORDER — CYCLOBENZAPRINE HCL 5 MG PO TABS: 5.0000 mg | ORAL_TABLET | ORAL | 0 refills | Status: DC | PRN

## 2022-11-11 MED ORDER — IBUPROFEN 800 MG PO TABS: 800.0000 mg | ORAL_TABLET | Freq: Three times a day (TID) | ORAL | 0 refills | Status: DC | PRN
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
## 2022-11-11 NOTE — Discharge Instructions (Signed)
Apply ice and warm compress over affected shoulder to help with pain. Take ibuprofen as needed for pain.

## 2022-11-14 DIAGNOSIS — F1120 Opioid dependence, uncomplicated: Secondary | ICD-10-CM | POA: Diagnosis not present

## 2022-11-14 DIAGNOSIS — Z79899 Other long term (current) drug therapy: Secondary | ICD-10-CM | POA: Diagnosis not present

## 2022-11-22 DIAGNOSIS — F1120 Opioid dependence, uncomplicated: Secondary | ICD-10-CM | POA: Diagnosis not present

## 2022-11-22 DIAGNOSIS — Z79899 Other long term (current) drug therapy: Secondary | ICD-10-CM | POA: Diagnosis not present

## 2022-12-03 ENCOUNTER — Other Ambulatory Visit: Payer: Self-pay

## 2022-12-03 ENCOUNTER — Emergency Department
Admission: EM | Admit: 2022-12-03 | Discharge: 2022-12-04 | Disposition: A | Discharge status: Home / Self Care | Payer: 59 | Attending: Emergency Medicine | Admitting: Emergency Medicine

## 2022-12-03 DIAGNOSIS — Z1152 Encounter for screening for COVID-19: Secondary | ICD-10-CM | POA: Diagnosis not present

## 2022-12-03 DIAGNOSIS — R519 Headache, unspecified: Secondary | ICD-10-CM

## 2022-12-03 DIAGNOSIS — G44209 Tension-type headache, unspecified, not intractable: Secondary | ICD-10-CM

## 2022-12-03 NOTE — ED Triage Notes (Signed)
Pt reports headache for the past 3 days, states she has been taking Tylenol and Ibuprofen at home with no relief. Pt reports photosensitivity, denies nausea or vomiting. Pt denies history of migraines. Pt talks in complete sentences no respiratory distress noted

## 2022-12-04 DIAGNOSIS — G44209 Tension-type headache, unspecified, not intractable: Secondary | ICD-10-CM | POA: Diagnosis not present

## 2022-12-04 LAB — SARS CORONAVIRUS 2 BY RT PCR: SARS Coronavirus 2 by RT PCR: NEGATIVE

## 2022-12-04 MED ORDER — DIPHENHYDRAMINE HCL 50 MG/ML IJ SOLN
25.0000 mg | Freq: Once | INTRAMUSCULAR | Status: AC
  Administered 2022-12-04: 25 mg via INTRAMUSCULAR
  Filled 2022-12-04: qty 1

## 2022-12-04 MED ORDER — PROCHLORPERAZINE MALEATE 10 MG PO TABS
10.0000 mg | ORAL_TABLET | Freq: Once | ORAL | Status: DC
  Filled 2022-12-04: qty 1

## 2022-12-04 MED ORDER — BUTALBITAL-APAP-CAFFEINE 50-325-40 MG PO TABS
2.0000 | ORAL_TABLET | Freq: Once | ORAL | Status: AC
  Administered 2022-12-04: 2 via ORAL
  Filled 2022-12-04: qty 2

## 2022-12-04 MED ORDER — LIDOCAINE 5 % EX PTCH
1.0000 | MEDICATED_PATCH | CUTANEOUS | Status: DC
  Administered 2022-12-04: 1 via TRANSDERMAL
  Filled 2022-12-04: qty 1

## 2022-12-04 MED ORDER — KETOROLAC TROMETHAMINE 30 MG/ML IJ SOLN
30.0000 mg | Freq: Once | INTRAMUSCULAR | Status: AC
  Administered 2022-12-04: 30 mg via INTRAMUSCULAR
  Filled 2022-12-04: qty 1

## 2022-12-04 NOTE — Discharge Instructions (Addendum)
Please take Tylenol and ibuprofen/Advil for your pain.  It is safe to take them together, or to alternate them every few hours.  Take up to 1000mg of Tylenol at a time, up to 4 times per day.  Do not take more than 4000 mg of Tylenol in 24 hours.  For ibuprofen, take 400-600 mg, 3 - 4 times per day.  

## 2022-12-04 NOTE — ED Provider Notes (Signed)
[facility] Provider Note    Event Date/Time   First MD Initiated Contact with Patient 12/03/22 2351     (approximate)   History   Headache   HPI  Jacqueline Cooke is a 42 y.o. female who presents to the ED for evaluation of Headache   Patient presents to the ED for evaluation of a severe headache for the past few days.  She reports the sensation of a tight band across her head, tightness from her left shoulder, left neck and base of her left occiput.  Reports that she has felt this sort of headache before, at that time it was in the setting of COVID-19.  She reports taking home COVID test that was negative.  No fevers, vision changes, falls, trauma or syncope, abdominal pain or emesis   Physical Exam   Triage Vital Signs: ED Triage Vitals [12/03/22 2345]  Encounter Vitals Group     BP (!) 143/94     Systolic BP Percentile      Diastolic BP Percentile      Pulse Rate 61     Resp 16     Temp 98.4 F (36.9 C)     Temp Source Oral     SpO2 100 %     Weight 169 lb (76.7 kg)     Height 5\' 7"  (1.702 m)     Head Circumference      Peak Flow      Pain Score 10     Pain Loc      Pain Education      Exclude from Growth Chart     Most recent vital signs: Vitals:   12/03/22 2345 12/04/22 0130  BP: (!) 143/94 (!) 130/97  Pulse: 61   Resp: 16   Temp: 98.4 F (36.9 C)   SpO2: 100% 96%    General: Awake, no distress.  CV:  Good peripheral perfusion.  Resp:  Normal effort.  Abd:  No distention.  MSK:  No deformity noted.  Tenderness over her left trapezius musculature to her left SCM, occiput that reproduces her "tight band" headache.  No overlying skin changes, swelling or signs of trauma.  No midline spinal tenderness. Neuro:  No focal deficits appreciated. Other:     ED Results / Procedures / Treatments   Labs (all labs ordered are listed, but only abnormal results are displayed) Labs Reviewed  SARS CORONAVIRUS 2 BY RT PCR     EKG   RADIOLOGY   Official radiology report(s): No results found.  PROCEDURES and INTERVENTIONS:  Procedures  Medications  lidocaine (LIDODERM) 5 % 1 patch (1 patch Transdermal Patch Applied 12/04/22 0040)  prochlorperazine (COMPAZINE) tablet 10 mg (10 mg Oral Not Given 12/04/22 0121)  butalbital-acetaminophen-caffeine (FIORICET) 50-325-40 MG per tablet 2 tablet (2 tablets Oral Given 12/04/22 0039)  ketorolac (TORADOL) 30 MG/ML injection 30 mg (30 mg Intramuscular Given 12/04/22 0039)  diphenhydrAMINE (BENADRYL) injection 25 mg (25 mg Intramuscular Given 12/04/22 0039)     IMPRESSION / MDM / ASSESSMENT AND PLAN / ED COURSE  I reviewed the triage vital signs and the nursing notes.  Differential diagnosis includes, but is not limited to, tension headache, viral syndrome, meningitis or encephalitis, migraine headache.  {Patient presents with symptoms of an acute illness or injury that is potentially life-threatening.  Patient presents with stigmata of a tension headache.  No evidence of neurologic deficits, trauma and no clear meningeal features.  Suspected tension headache most likely.  We will treat for this and reassess.  Will obtain a COVID swab as well considering her preceding history. Clinical Course as of 12/04/22 0340  Sun Dec 04, 2022  0204 Reassessed, feeling better [DS]  0223 Reassessed, feeling well and requesting discharge.  Discussed negative COVID test, management at home.  Return precautions. [DS]    Clinical Course User Index [DS] Delton Prairie, MD     FINAL CLINICAL IMPRESSION(S) / ED DIAGNOSES   Final diagnoses:  Bad headache  Tension headache     Rx / DC Orders   ED Discharge Orders     None        Note:  This document was prepared using Dragon voice recognition software and may include unintentional dictation errors.   Delton Prairie, MD 12/04/22 [phone]

## 2022-12-06 ENCOUNTER — Emergency Department
Admission: EM | Admit: 2022-12-06 | Discharge: 2022-12-07 | Disposition: A | Discharge status: Home / Self Care | Payer: 59 | Attending: Student in an Organized Health Care Education/Training Program | Admitting: Student in an Organized Health Care Education/Training Program

## 2022-12-06 ENCOUNTER — Other Ambulatory Visit: Payer: Self-pay

## 2022-12-06 ENCOUNTER — Emergency Department: Payer: 59

## 2022-12-06 DIAGNOSIS — R519 Headache, unspecified: Secondary | ICD-10-CM | POA: Insufficient documentation

## 2022-12-06 MED ORDER — SUMATRIPTAN SUCCINATE 6 MG/0.5ML ~~LOC~~ SOLN
6.0000 mg | Freq: Once | SUBCUTANEOUS | Status: AC
  Administered 2022-12-07: 6 mg via SUBCUTANEOUS
  Filled 2022-12-06: qty 0.5

## 2022-12-06 MED ORDER — PROMETHAZINE HCL 25 MG/ML IJ SOLN
25.0000 mg | Freq: Once | INTRAMUSCULAR | Status: AC
  Administered 2022-12-07: 25 mg via INTRAMUSCULAR
  Filled 2022-12-06: qty 1

## 2022-12-06 MED ORDER — KETOROLAC TROMETHAMINE 30 MG/ML IJ SOLN
30.0000 mg | Freq: Once | INTRAMUSCULAR | Status: AC
  Administered 2022-12-07: 30 mg via INTRAMUSCULAR
  Filled 2022-12-06: qty 1

## 2022-12-06 MED ORDER — DIPHENHYDRAMINE HCL 25 MG PO CAPS
50.0000 mg | ORAL_CAPSULE | Freq: Once | ORAL | Status: AC
  Administered 2022-12-07: 50 mg via ORAL
  Filled 2022-12-06: qty 2

## 2022-12-06 NOTE — ED Provider Notes (Signed)
[facility] Provider Note  Patient Contact: 11:36 PM (approximate)   History   Headache   HPI  Jacqueline Cooke is a 42 y.o. female who presents the emergency department for ongoing headache.  Patient was seen 2 days ago, was given Toradol and Fioricet for headache, started to have improvement, went home and then has had a rebound headache.  Patient denies any concerning neurosymptoms of slurred speech, vision changes, unilateral weakness.  No fevers or chills.  At this time patient returns for medications for ongoing headache.     Physical Exam   Triage Vital Signs: ED Triage Vitals [12/06/22 2130]  Encounter Vitals Group     BP (!) 139/105     Systolic BP Percentile      Diastolic BP Percentile      Pulse Rate 80     Resp 16     Temp 98.7 F (37.1 C)     Temp Source Oral     SpO2 99 %     Weight      Height      Head Circumference      Peak Flow      Pain Score 10     Pain Loc      Pain Education      Exclude from Growth Chart     Most recent vital signs: Vitals:   12/06/22 2130  BP: (!) 139/105  Pulse: 80  Resp: 16  Temp: 98.7 F (37.1 C)  SpO2: 99%     General: Alert and in no acute distress. Eyes:  PERRL. EOMI. Head: No acute traumatic findings  Neck: No stridor. No cervical spine tenderness to palpation  Cardiovascular:  Good peripheral perfusion Respiratory: Normal respiratory effort without tachypnea or retractions. Lungs CTAB. Musculoskeletal: Full range of motion to all extremities.  Neurologic:  No gross focal neurologic deficits are appreciated.  Cranial nerves grossly intact at this time. Skin:   No rash noted Other:   ED Results / Procedures / Treatments   Labs (all labs ordered are listed, but only abnormal results are displayed) Labs Reviewed - No data to display   EKG     RADIOLOGY  I personally viewed, evaluated, and interpreted these images as part of my medical decision making, as well  as reviewing the written report by the radiologist.  ED Provider Interpretation: No acute findings on CT scan of the head  CT Head Wo Contrast  Result Date: 12/06/2022 CLINICAL DATA:  Headache EXAM: CT HEAD WITHOUT CONTRAST TECHNIQUE: Contiguous axial images were obtained from the base of the skull through the vertex without intravenous contrast. RADIATION DOSE REDUCTION: This exam was performed according to the departmental dose-optimization program which includes automated exposure control, adjustment of the mA and/or kV according to patient size and/or use of iterative reconstruction technique. COMPARISON:  None Available. FINDINGS: Brain: No acute intracranial abnormality. Specifically, no hemorrhage, hydrocephalus, mass lesion, acute infarction, or significant intracranial injury. Vascular: No hyperdense vessel or unexpected calcification. Skull: No acute calvarial abnormality. Sinuses/Orbits: No acute findings Other: None IMPRESSION: No acute intracranial abnormality. Electronically Signed   By: Charlett Nose M.D.   On: 12/06/2022 23:10    PROCEDURES:  Critical Care performed: No  Procedures   MEDICATIONS ORDERED IN ED: Medications  ketorolac (TORADOL) 30 MG/ML injection 30 mg (has no administration in time range)  promethazine (PHENERGAN) injection 25 mg (has no administration in time range)  SUMAtriptan (IMITREX) injection 6 mg (has no administration  in time range)  diphenhydrAMINE (BENADRYL) capsule 50 mg (has no administration in time range)     IMPRESSION / MDM / ASSESSMENT AND PLAN / ED COURSE  I reviewed the triage vital signs and the nursing notes.                                 Differential diagnosis includes, but is not limited to, migraine, CVA, intracranial hemorrhage, tension type headache   Patient's presentation is most consistent with acute presentation with potential threat to life or bodily function.   Patient's diagnosis is consistent with bad headache.   Patient presents emergency department with an ongoing headache.  Was seen 2 days ago, given Toradol and Fioricet.  Patient appears for a rebound headache.  No concerning neurodeficits.  No other concerns for findings such as meningitis.  Patient had reassuring imaging.  Will try different combination of medication with Toradol, Phenergan, Benadryl and Imitrex.  Concerning signs and symptoms and return precautions discussed with the patient.  Follow-up primary care as needed. Patient is given ED precautions to return to the ED for any worsening or new symptoms.     FINAL CLINICAL IMPRESSION(S) / ED DIAGNOSES   Final diagnoses:  Bad headache     Rx / DC Orders   ED Discharge Orders     None        Note:  This document was prepared using Dragon voice recognition software and may include unintentional dictation errors.   Lanette Hampshire 12/06/22 2338    Willy Eddy, MD 12/06/22 [phone]

## 2022-12-06 NOTE — ED Triage Notes (Signed)
Pt presents to ER with c/o HA that has been getting worse since being seen here on 8/3.  Pt reports she was given some meds here, and pain eased up some, but has come back worse.  Pt states pain feels like a pressure in the front of her face, and has pain that goes into back of her neck.  Pt states pain in head is worsened when bending over, with pain going from neck into back of head.  States she has been using OTC tylenol and motrin without relief.  Pt denies n/v, but has some sensitivity to light and sounds.  Pt otherwise A&Ox4 and in NAD at this time.

## 2022-12-07 DIAGNOSIS — R519 Headache, unspecified: Secondary | ICD-10-CM | POA: Diagnosis not present

## 2022-12-13 ENCOUNTER — Other Ambulatory Visit: Payer: Self-pay | Admitting: Family Medicine

## 2022-12-13 DIAGNOSIS — Z1231 Encounter for screening mammogram for malignant neoplasm of breast: Secondary | ICD-10-CM

## 2022-12-20 ENCOUNTER — Encounter: Payer: Self-pay | Admitting: Radiology

## 2022-12-20 ENCOUNTER — Ambulatory Visit
Admission: RE | Admit: 2022-12-20 | Discharge: 2022-12-20 | Disposition: A | Discharge status: Home / Self Care | Payer: 59 | Source: Ambulatory Visit | Attending: Family Medicine | Admitting: Family Medicine

## 2022-12-20 DIAGNOSIS — Z1231 Encounter for screening mammogram for malignant neoplasm of breast: Secondary | ICD-10-CM | POA: Diagnosis present

## 2022-12-31 ENCOUNTER — Emergency Department: Payer: 59

## 2022-12-31 ENCOUNTER — Emergency Department
Admission: EM | Admit: 2022-12-31 | Discharge: 2022-12-31 | Discharge status: Against Medical Advice | Payer: 59 | Attending: Emergency Medicine | Admitting: Emergency Medicine

## 2022-12-31 ENCOUNTER — Other Ambulatory Visit: Payer: Self-pay

## 2022-12-31 DIAGNOSIS — J449 Chronic obstructive pulmonary disease, unspecified: Secondary | ICD-10-CM | POA: Diagnosis not present

## 2022-12-31 DIAGNOSIS — R0602 Shortness of breath: Secondary | ICD-10-CM | POA: Diagnosis not present

## 2022-12-31 DIAGNOSIS — R079 Chest pain, unspecified: Secondary | ICD-10-CM | POA: Diagnosis present

## 2022-12-31 DIAGNOSIS — Z5321 Procedure and treatment not carried out due to patient leaving prior to being seen by health care provider: Secondary | ICD-10-CM | POA: Insufficient documentation

## 2022-12-31 LAB — CBC
HCT: 36.6 % (ref 36.0–46.0)
Hemoglobin: 12.8 g/dL (ref 12.0–15.0)
MCH: 32.5 pg (ref 26.0–34.0)
MCHC: 35 g/dL (ref 30.0–36.0)
MCV: 92.9 fL (ref 80.0–100.0)
Platelets: 232 10*3/uL (ref 150–400)
RBC: 3.94 MIL/uL (ref 3.87–5.11)
RDW: 12 % (ref 11.5–15.5)
WBC: 7.6 10*3/uL (ref 4.0–10.5)
nRBC: 0 % (ref 0.0–0.2)

## 2022-12-31 LAB — BASIC METABOLIC PANEL
Anion gap: 9 (ref 5–15)
BUN: 13 mg/dL (ref 6–20)
CO2: 21 mmol/L — ABNORMAL LOW (ref 22–32)
Calcium: 9.5 mg/dL (ref 8.9–10.3)
Chloride: 110 mmol/L (ref 98–111)
Creatinine, Ser: 0.83 mg/dL (ref 0.44–1.00)
GFR, Estimated: >60 mL/min (ref >60)
Glucose, Bld: 90 mg/dL (ref 70–99)
Potassium: 4.1 mmol/L (ref 3.5–5.1)
Sodium: 140 mmol/L (ref 135–145)

## 2022-12-31 LAB — TROPONIN I (HIGH SENSITIVITY): Troponin I (High Sensitivity): 2 ng/L (ref <18)

## 2022-12-31 NOTE — ED Triage Notes (Signed)
Pt to ED via POV from home. Pt reports centralized CP and SOB since yesterday that has been intermittent. Pt reports SOB and CP and neck pain that has gotten . Pt reports hx of asthma and COPD. Pt reports using inhaler PTA. Pt with recent sick contacts.

## 2022-12-31 NOTE — ED Notes (Addendum)
Pt went to assess pt and was notified by a ER tech that pt and family left the ER. Triage RN notified this RN that pt did not have IV in place. MD notified.

## 2023-01-13 ENCOUNTER — Telehealth: Payer: Self-pay

## 2023-01-13 NOTE — Telephone Encounter (Signed)
Transition Care Management Follow-up Telephone Call Date of discharge and from where: [place] 8/31 How have you been since you were released from the hospital? Patient stated she was good and she has followed up with providers then declined the rest of the call  Any questions or concerns?   Items Reviewed: Did the pt receive and understand the discharge instructions provided?  Medications obtained and verified?  Other?  Any new allergies since your discharge?  Dietary orders reviewed?  Do you have support at home?     Follow up appointments reviewed:  PCP Hospital f/u appt confirmed? Yes  Scheduled to see  on  @ . Specialist Hospital f/u appt confirmed? No  Scheduled to see  on  @ . Are transportation arrangements needed?  If their condition worsens, is the pt aware to call PCP or go to the Emergency Dept.?  Was the patient provided with contact information for the PCP's office or ED?  Was to pt encouraged to call back with questions or concerns?

## 2023-01-22 ENCOUNTER — Encounter: Payer: Self-pay | Admitting: Emergency Medicine

## 2023-01-22 ENCOUNTER — Emergency Department
Admission: EM | Admit: 2023-01-22 | Discharge: 2023-01-22 | Disposition: A | Discharge status: Home / Self Care | Payer: 59 | Attending: Emergency Medicine | Admitting: Emergency Medicine

## 2023-01-22 ENCOUNTER — Other Ambulatory Visit: Payer: Self-pay

## 2023-01-22 DIAGNOSIS — R519 Headache, unspecified: Secondary | ICD-10-CM | POA: Diagnosis present

## 2023-01-22 DIAGNOSIS — Z1152 Encounter for screening for COVID-19: Secondary | ICD-10-CM | POA: Insufficient documentation

## 2023-01-22 DIAGNOSIS — R55 Syncope and collapse: Secondary | ICD-10-CM | POA: Insufficient documentation

## 2023-01-22 DIAGNOSIS — G44209 Tension-type headache, unspecified, not intractable: Secondary | ICD-10-CM | POA: Diagnosis not present

## 2023-01-22 LAB — BASIC METABOLIC PANEL
Anion gap: 9 (ref 5–15)
BUN: 14 mg/dL (ref 6–20)
CO2: 24 mmol/L (ref 22–32)
Calcium: 8.9 mg/dL (ref 8.9–10.3)
Chloride: 106 mmol/L (ref 98–111)
Creatinine, Ser: 0.81 mg/dL (ref 0.44–1.00)
GFR, Estimated: >60 mL/min (ref >60)
Glucose, Bld: 93 mg/dL (ref 70–99)
Potassium: 3.7 mmol/L (ref 3.5–5.1)
Sodium: 139 mmol/L (ref 135–145)

## 2023-01-22 LAB — RESP PANEL BY RT-PCR (RSV, FLU A&B, COVID)  RVPGX2
Influenza A by PCR: NEGATIVE
Influenza B by PCR: NEGATIVE
Resp Syncytial Virus by PCR: NEGATIVE
SARS Coronavirus 2 by RT PCR: NEGATIVE

## 2023-01-22 LAB — CBC
HCT: 38.8 % (ref 36.0–46.0)
Hemoglobin: 13.4 g/dL (ref 12.0–15.0)
MCH: 32.7 pg (ref 26.0–34.0)
MCHC: 34.5 g/dL (ref 30.0–36.0)
MCV: 94.6 fL (ref 80.0–100.0)
Platelets: 230 10*3/uL (ref 150–400)
RBC: 4.1 MIL/uL (ref 3.87–5.11)
RDW: 12.2 % (ref 11.5–15.5)
WBC: 7 10*3/uL (ref 4.0–10.5)
nRBC: 0 % (ref 0.0–0.2)

## 2023-01-22 MED ORDER — SODIUM CHLORIDE 0.9 % IV BOLUS
1000.0000 mL | Freq: Once | INTRAVENOUS | Status: AC
  Administered 2023-01-22: 1000 mL via INTRAVENOUS

## 2023-01-22 MED ORDER — DIPHENHYDRAMINE HCL 50 MG/ML IJ SOLN
25.0000 mg | Freq: Once | INTRAMUSCULAR | Status: AC
  Administered 2023-01-22: 25 mg via INTRAVENOUS
  Filled 2023-01-22: qty 1

## 2023-01-22 MED ORDER — CYCLOBENZAPRINE HCL 10 MG PO TABS
10.0000 mg | ORAL_TABLET | Freq: Once | ORAL | Status: AC
  Administered 2023-01-22: 10 mg via ORAL
  Filled 2023-01-22: qty 1

## 2023-01-22 MED ORDER — DEXAMETHASONE SODIUM PHOSPHATE 10 MG/ML IJ SOLN
10.0000 mg | Freq: Once | INTRAMUSCULAR | Status: AC
  Administered 2023-01-22: 10 mg via INTRAVENOUS
  Filled 2023-01-22: qty 1

## 2023-01-22 MED ORDER — CYCLOBENZAPRINE HCL 10 MG PO TABS
10.0000 mg | ORAL_TABLET | Freq: Three times a day (TID) | ORAL | 0 refills | Status: AC | PRN
Start: 2023-01-22 — End: ?

## 2023-01-22 MED ORDER — KETOROLAC TROMETHAMINE 15 MG/ML IJ SOLN
15.0000 mg | Freq: Once | INTRAMUSCULAR | Status: AC
  Administered 2023-01-22: 15 mg via INTRAVENOUS
  Filled 2023-01-22: qty 1

## 2023-01-22 NOTE — ED Triage Notes (Addendum)
Note on wrong patient

## 2023-01-22 NOTE — ED Notes (Signed)
See triage note  Presents with headache  states pain started this am   Low grade temp on arrival  States headache is thur entire head   No n/v

## 2023-01-22 NOTE — ED Provider Notes (Signed)
[facility] Provider Note    Event Date/Time   First MD Initiated Contact with Patient 01/22/23 1340     (approximate)   History   Headache and Near Syncope   HPI  Jacqueline Cooke is a 42 y.o. female with history of migraines, substance abuse presents emergency department with headache and neck pain.  States painful to turn and rotate her neck.  States headache started yesterday.  Took her Topamax without any relief.  No vomiting.  Very sensitive to light and noise.  Denies fever or chills.  However she does states she had a headache like this when she had the flu.      Physical Exam   Triage Vital Signs: ED Triage Vitals  Encounter Vitals Group     BP 01/22/23 1228 121/88     Systolic BP Percentile --      Diastolic BP Percentile --      Pulse Rate 01/22/23 1228 90     Resp 01/22/23 1228 20     Temp 01/22/23 1228 99.5 F (37.5 C)     Temp Source 01/22/23 1228 Oral     SpO2 01/22/23 1228 97 %     Weight 01/22/23 1229 165 lb (74.8 kg)     Height 01/22/23 1229 5\' 9"  (1.753 m)     Head Circumference --      Peak Flow --      Pain Score 01/22/23 1228 10     Pain Loc --      Pain Education --      Exclude from Growth Chart --     Most recent vital signs: Vitals:   01/22/23 1228 01/22/23 1547  BP: 121/88 105/72  Pulse: 90 87  Resp: 20 18  Temp: 99.5 F (37.5 C)   SpO2: 97% 96%     General: Awake, no distress.   CV:  Good peripheral perfusion. regular rate and  rhythm Resp:  Normal effort. Lungs cta Abd:  No distention.   Other:  Cranial nerves II through XII grossly intact   ED Results / Procedures / Treatments   Labs (all labs ordered are listed, but only abnormal results are displayed) Labs Reviewed  RESP PANEL BY RT-PCR (RSV, FLU A&B, COVID)  [id number]  BASIC METABOLIC PANEL  CBC  URINALYSIS, ROUTINE W REFLEX MICROSCOPIC  CBG MONITORING, ED  POC URINE PREG, ED      EKG     RADIOLOGY     PROCEDURES:   Procedures   MEDICATIONS ORDERED IN ED: Medications  sodium chloride 0.9 % bolus 1,000 mL (0 mLs Intravenous Stopped 01/22/23 1553)  ketorolac (TORADOL) 15 MG/ML injection 15 mg (15 mg Intravenous Given 01/22/23 1459)  diphenhydrAMINE (BENADRYL) injection 25 mg (25 mg Intravenous Given 01/22/23 1459)  dexamethasone (DECADRON) injection 10 mg (10 mg Intravenous Given 01/22/23 1459)  cyclobenzaprine (FLEXERIL) tablet 10 mg (10 mg Oral Given 01/22/23 1500)     IMPRESSION / MDM / ASSESSMENT AND PLAN / ED COURSE  I reviewed the triage vital signs and the nursing notes.                              Differential diagnosis includes, but is not limited to, COVID, influenza, migraine, tension headache, cluster headache  Patient's presentation is most consistent with acute illness / injury with system symptoms.   Respiratory panel, basic metabolic panel and CBC are all reassuring  Patient  was given a migraine cocktail with normal saline 1 L IV, Toradol 15 mg IV, Decadron 10 mg IV, Benadryl 25 mg IV, and Flexeril 10 mg p.o. for the tension in her neck and upper back.   Patient had relief with medication.  Will call in a prescription for a muscle relaxer due to the tension type headache.  She is to follow-up with her regular doctor.  Return if worsening.  She is in agreement treatment plan.  She is discharged stable condition in the care of her family member.   FINAL CLINICAL IMPRESSION(S) / ED DIAGNOSES   Final diagnoses:  Bad headache  Tension headache     Rx / DC Orders   ED Discharge Orders          Ordered    cyclobenzaprine (FLEXERIL) 10 MG tablet  3 times daily PRN        01/22/23 1615             Note:  This document was prepared using Dragon voice recognition software and may include unintentional dictation errors.    Faythe Ghee, PA-C 01/22/23 1616    Corena Herter, MD 01/22/23 2015

## 2023-01-22 NOTE — ED Triage Notes (Addendum)
Pt states waking up at 8am with a headache that radiates down the neck. Pt states she almost fainted as well. Pt states previous pain like this, and she was diagnosed with the flu. Pt states she has not been drinking a lot of fluids and may also be dehydrated. Pt states taking pain meds prior to coming in, but has had no relief.

## 2023-01-22 NOTE — ED Notes (Signed)
Pt in bed watching tv and eating chips. No s/s of distress noted, reports she feels a lot better; pain 0/10, encouraged to provide UA per EDP order.

## 2023-01-26 ENCOUNTER — Emergency Department: Payer: 59

## 2023-01-26 ENCOUNTER — Inpatient Hospital Stay
Admission: EM | Admit: 2023-01-26 | Discharge: 2023-01-29 | DRG: 075 | Disposition: A | Discharge status: Home / Self Care | Payer: 59 | Attending: Internal Medicine | Admitting: Internal Medicine

## 2023-01-26 ENCOUNTER — Other Ambulatory Visit: Payer: Self-pay

## 2023-01-26 ENCOUNTER — Encounter: Payer: Self-pay | Admitting: *Deleted

## 2023-01-26 DIAGNOSIS — B003 Herpesviral meningitis: Secondary | ICD-10-CM | POA: Diagnosis not present

## 2023-01-26 DIAGNOSIS — M5020 Other cervical disc displacement, unspecified cervical region: Principal | ICD-10-CM

## 2023-01-26 DIAGNOSIS — T378X5A Adverse effect of other specified systemic anti-infectives and antiparasitics, initial encounter: Secondary | ICD-10-CM | POA: Diagnosis present

## 2023-01-26 DIAGNOSIS — Z880 Allergy status to penicillin: Secondary | ICD-10-CM

## 2023-01-26 DIAGNOSIS — F1411 Cocaine abuse, in remission: Secondary | ICD-10-CM | POA: Diagnosis present

## 2023-01-26 DIAGNOSIS — I10 Essential (primary) hypertension: Secondary | ICD-10-CM | POA: Diagnosis present

## 2023-01-26 DIAGNOSIS — M069 Rheumatoid arthritis, unspecified: Secondary | ICD-10-CM | POA: Diagnosis present

## 2023-01-26 DIAGNOSIS — Z720 Tobacco use: Secondary | ICD-10-CM | POA: Diagnosis present

## 2023-01-26 DIAGNOSIS — E876 Hypokalemia: Secondary | ICD-10-CM | POA: Diagnosis present

## 2023-01-26 DIAGNOSIS — J4540 Moderate persistent asthma, uncomplicated: Secondary | ICD-10-CM | POA: Diagnosis present

## 2023-01-26 DIAGNOSIS — D84821 Immunodeficiency due to drugs: Secondary | ICD-10-CM | POA: Diagnosis present

## 2023-01-26 DIAGNOSIS — F17210 Nicotine dependence, cigarettes, uncomplicated: Secondary | ICD-10-CM | POA: Diagnosis present

## 2023-01-26 DIAGNOSIS — R519 Headache, unspecified: Secondary | ICD-10-CM | POA: Diagnosis present

## 2023-01-26 DIAGNOSIS — Z7951 Long term (current) use of inhaled steroids: Secondary | ICD-10-CM

## 2023-01-26 DIAGNOSIS — M47812 Spondylosis without myelopathy or radiculopathy, cervical region: Secondary | ICD-10-CM | POA: Diagnosis present

## 2023-01-26 DIAGNOSIS — G43909 Migraine, unspecified, not intractable, without status migrainosus: Secondary | ICD-10-CM | POA: Diagnosis present

## 2023-01-26 DIAGNOSIS — J4489 Other specified chronic obstructive pulmonary disease: Secondary | ICD-10-CM | POA: Diagnosis present

## 2023-01-26 DIAGNOSIS — Z79899 Other long term (current) drug therapy: Secondary | ICD-10-CM

## 2023-01-26 DIAGNOSIS — Z1152 Encounter for screening for COVID-19: Secondary | ICD-10-CM

## 2023-01-26 DIAGNOSIS — Z793 Long term (current) use of hormonal contraceptives: Secondary | ICD-10-CM

## 2023-01-26 LAB — CBC WITH DIFFERENTIAL/PLATELET
Abs Immature Granulocytes: 0.02 10*3/uL (ref 0.00–0.07)
Basophils Absolute: 0 10*3/uL (ref 0.0–0.1)
Basophils Relative: 0 %
Eosinophils Absolute: 0.2 10*3/uL (ref 0.0–0.5)
Eosinophils Relative: 2 %
HCT: 37.9 % (ref 36.0–46.0)
Hemoglobin: 13.1 g/dL (ref 12.0–15.0)
Immature Granulocytes: 0 %
Lymphocytes Relative: 30 %
Lymphs Abs: 2 10*3/uL (ref 0.7–4.0)
MCH: 32.2 pg (ref 26.0–34.0)
MCHC: 34.6 g/dL (ref 30.0–36.0)
MCV: 93.1 fL (ref 80.0–100.0)
Monocytes Absolute: 0.6 10*3/uL (ref 0.1–1.0)
Monocytes Relative: 8 %
Neutro Abs: 4 10*3/uL (ref 1.7–7.7)
Neutrophils Relative %: 60 %
Platelets: 248 10*3/uL (ref 150–400)
RBC: 4.07 MIL/uL (ref 3.87–5.11)
RDW: 12 % (ref 11.5–15.5)
WBC: 6.7 10*3/uL (ref 4.0–10.5)
nRBC: 0 % (ref 0.0–0.2)

## 2023-01-26 LAB — HEPATIC FUNCTION PANEL
ALT: 22 U/L (ref 0–44)
AST: 20 U/L (ref 15–41)
Albumin: 3.5 g/dL (ref 3.5–5.0)
Alkaline Phosphatase: 50 U/L (ref 38–126)
Bilirubin, Direct: 0.3 mg/dL — ABNORMAL HIGH (ref 0.0–0.2)
Indirect Bilirubin: 0.6 mg/dL (ref 0.3–0.9)
Total Bilirubin: 0.9 mg/dL (ref 0.3–1.2)
Total Protein: 6.3 g/dL — ABNORMAL LOW (ref 6.5–8.1)

## 2023-01-26 LAB — CSF CELL COUNT WITH DIFFERENTIAL
Eosinophils, CSF: 0 %
Eosinophils, CSF: 0 %
Lymphs, CSF: 91 %
Lymphs, CSF: 96 %
Monocyte-Macrophage-Spinal Fluid: 3 %
Monocyte-Macrophage-Spinal Fluid: 7 %
RBC Count, CSF: 0 /mm3 (ref 0–3)
RBC Count, CSF: 37 /mm3 — ABNORMAL HIGH (ref 0–3)
Segmented Neutrophils-CSF: 1 %
Segmented Neutrophils-CSF: 2 %
Tube #: 1
Tube #: 4
WBC, CSF: 399 /mm3 (ref 0–5)
WBC, CSF: 488 /mm3 (ref 0–5)

## 2023-01-26 LAB — HEMOGLOBIN A1C
Hgb A1c MFr Bld: 5.2 % (ref 4.8–5.6)
Mean Plasma Glucose: 102.54 mg/dL

## 2023-01-26 LAB — BASIC METABOLIC PANEL
Anion gap: 7 (ref 5–15)
BUN: 14 mg/dL (ref 6–20)
CO2: 23 mmol/L (ref 22–32)
Calcium: 8.9 mg/dL (ref 8.9–10.3)
Chloride: 110 mmol/L (ref 98–111)
Creatinine, Ser: 0.91 mg/dL (ref 0.44–1.00)
GFR, Estimated: >60 mL/min (ref >60)
Glucose, Bld: 93 mg/dL (ref 70–99)
Potassium: 4.4 mmol/L (ref 3.5–5.1)
Sodium: 140 mmol/L (ref 135–145)

## 2023-01-26 LAB — CSF CULTURE W GRAM STAIN

## 2023-01-26 LAB — PROTEIN AND GLUCOSE, CSF
Glucose, CSF: 52 mg/dL (ref 40–70)
Total  Protein, CSF: 117 mg/dL — ABNORMAL HIGH (ref 15–45)

## 2023-01-26 LAB — SARS CORONAVIRUS 2 BY RT PCR: SARS Coronavirus 2 by RT PCR: NEGATIVE

## 2023-01-26 LAB — TSH: TSH: 1.799 u[IU]/mL (ref 0.350–4.500)

## 2023-01-26 LAB — HCG, QUANTITATIVE, PREGNANCY: hCG, Beta Chain, Quant, S: <1 m[IU]/mL (ref <5)

## 2023-01-26 LAB — HIV ANTIBODY (ROUTINE TESTING W REFLEX): HIV Screen 4th Generation wRfx: NONREACTIVE

## 2023-01-26 LAB — T4, FREE: Free T4: 0.83 ng/dL (ref 0.61–1.12)

## 2023-01-26 MED ORDER — FLUTICASONE FUROATE-VILANTEROL 100-25 MCG/ACT IN AEPB
1.0000 | INHALATION_SPRAY | Freq: Every day | RESPIRATORY_TRACT | Status: DC
  Filled 2023-01-26 (×2): qty 28

## 2023-01-26 MED ORDER — ACETAMINOPHEN 650 MG RE SUPP: 650.0000 mg | Freq: Four times a day (QID) | RECTAL | Status: DC | PRN

## 2023-01-26 MED ORDER — METOCLOPRAMIDE HCL 5 MG/ML IJ SOLN
10.0000 mg | INTRAMUSCULAR | Status: AC
  Administered 2023-01-26: 10 mg via INTRAVENOUS
  Filled 2023-01-26: qty 2

## 2023-01-26 MED ORDER — SODIUM CHLORIDE 0.9 % IV SOLN: Freq: Once | INTRAVENOUS | Status: AC

## 2023-01-26 MED ORDER — MORPHINE SULFATE (PF) 4 MG/ML IV SOLN
4.0000 mg | Freq: Once | INTRAVENOUS | Status: AC
  Administered 2023-01-26: 4 mg via INTRAVENOUS
  Filled 2023-01-26: qty 1

## 2023-01-26 MED ORDER — SODIUM CHLORIDE 0.9% FLUSH
3.0000 mL | Freq: Two times a day (BID) | INTRAVENOUS | Status: DC
  Administered 2023-01-26 – 2023-01-29 (×6): 3 mL via INTRAVENOUS

## 2023-01-26 MED ORDER — ACETAMINOPHEN 325 MG PO TABS: 650.0000 mg | ORAL_TABLET | Freq: Four times a day (QID) | ORAL | Status: DC | PRN

## 2023-01-26 MED ORDER — KETOROLAC TROMETHAMINE 15 MG/ML IJ SOLN
15.0000 mg | Freq: Once | INTRAMUSCULAR | Status: AC
  Administered 2023-01-26: 15 mg via INTRAVENOUS
  Filled 2023-01-26: qty 1

## 2023-01-26 MED ORDER — LIDOCAINE-EPINEPHRINE 2 %-1:100000 IJ SOLN
20.0000 mL | Freq: Once | INTRAMUSCULAR | Status: AC
  Administered 2023-01-26: 20 mL
  Filled 2023-01-26: qty 1

## 2023-01-26 MED ORDER — GADOBUTROL 1 MMOL/ML IV SOLN
7.5000 mL | Freq: Once | INTRAVENOUS | Status: AC | PRN
  Administered 2023-01-26: 7.5 mL via INTRAVENOUS

## 2023-01-26 MED ORDER — SODIUM CHLORIDE 0.9 % IV BOLUS
1000.0000 mL | Freq: Once | INTRAVENOUS | Status: AC
  Administered 2023-01-26: 1000 mL via INTRAVENOUS

## 2023-01-26 MED ORDER — OXYCODONE HCL 5 MG PO TABS
5.0000 mg | ORAL_TABLET | Freq: Three times a day (TID) | ORAL | Status: DC | PRN
  Administered 2023-01-26 – 2023-01-27 (×2): 5 mg via ORAL
  Filled 2023-01-26 (×2): qty 1

## 2023-01-26 MED ORDER — SODIUM CHLORIDE 0.9 % IV SOLN
2.0000 g | Freq: Once | INTRAVENOUS | Status: AC
  Administered 2023-01-26: 2 g via INTRAVENOUS
  Filled 2023-01-26: qty 20

## 2023-01-26 MED ORDER — VALACYCLOVIR HCL 500 MG PO TABS
1000.0000 mg | ORAL_TABLET | Freq: Three times a day (TID) | ORAL | Status: DC
  Administered 2023-01-26: 1000 mg via ORAL
  Filled 2023-01-26: qty 2

## 2023-01-26 MED ORDER — ALBUTEROL SULFATE (2.5 MG/3ML) 0.083% IN NEBU: 3.0000 mL | INHALATION_SOLUTION | Freq: Four times a day (QID) | RESPIRATORY_TRACT | Status: DC | PRN

## 2023-01-26 MED ORDER — VALPROATE SODIUM 100 MG/ML IV SOLN
1000.0000 mg | Freq: Once | INTRAVENOUS | Status: AC
  Administered 2023-01-26: 1000 mg via INTRAVENOUS
  Filled 2023-01-26: qty 10

## 2023-01-26 MED ORDER — MIRTAZAPINE 15 MG PO TABS
15.0000 mg | ORAL_TABLET | Freq: Every day | ORAL | Status: DC
  Administered 2023-01-26: 15 mg via ORAL
  Filled 2023-01-26: qty 1

## 2023-01-26 MED ORDER — IOHEXOL 350 MG/ML SOLN
75.0000 mL | Freq: Once | INTRAVENOUS | Status: AC | PRN
  Administered 2023-01-26: 75 mL via INTRAVENOUS

## 2023-01-26 NOTE — H&P (Addendum)
History and Physical    Patient: Jacqueline Cooke ZOX:[id number] DOB: 23-Jun-1980 DOA: 01/26/2023 DOS: the patient was seen and examined on 01/26/2023 PCP: [facility]  Patient coming from: Home   Chief Complaint:  Chief Complaint  Patient presents with   Headache    HPI: Jacqueline Cooke is a 42 y.o. female with medical history significant for asthma, anemia , COPD, depression, HTN,substance abuse, cocaine abuse. Pt coming for 5 days of headaches, it sometimes it radiates to neck bilaterally.no vision changes or paresthesia , incontinence no dizziness.Pt does have history of migraine and has used NSAID's. Pt eval by neurologist and recommended MRI brain  and c spine: 1. Motion degraded post-contrast imaging. Within this limitation, findings are as follows. 2. No evidence of an acute intracranial abnormality. The cerebellar findings described on the head CT performed earlier today were artifactual. 3. There are a few small nonspecific T2 FLAIR hyperintense chronic insults within the cerebral white matter. 4. Mild right sphenoid sinusitis.  And cervical spine: 1. Cervical spondylosis as described. 2. Most notably at C4-C5, there is mild-to-moderate disc degeneration with a disc bulge, superimposed small central disc protrusion and left-sided uncovertebral hypertrophy. The disc protrusion effaces the ventral thecal sac and mildly flattens the ventral aspect of the spinal cord. However, the dorsal CSF space is maintained within the spinal canal. 3. Nonspecific reversal of the expected cervical lordosis.  Review of Systems: Review of Systems  Neurological:  Positive for headaches.  All other systems reviewed and are negative.   Past Medical History:  Diagnosis Date   Anemia    Asthma    COPD (chronic obstructive pulmonary disease) (HCC)    Depression    Hyperphosphatemia    Hypertension    Leukocytosis    Substance abuse (HCC)     History of Cocaine abuse   Tachycardia    Past Surgical History:  Procedure Laterality Date   CESAREAN SECTION  01/04/2013   DILATION AND CURETTAGE OF UTERUS  2012   [facility]  2013 [facility]   TRACHEOSTOMY  10/2010   Social History:   reports that she has been smoking cigarettes. She has never used smokeless tobacco. She reports that she does not currently use alcohol. She reports that she does not currently use drugs after having used the following drugs: "Crack" cocaine and Marijuana.  Allergies  Allergen Reactions   Penicillins Rash and Hives    Rash     Family History  Family history unknown: Yes    Prior to Admission medications   Medication Sig Start Date End Date Taking? Authorizing Provider  albuterol (PROVENTIL,VENTOLIN) 90 MCG/ACT inhaler Inhale 2 puffs into the lungs every 6 (six) hours as needed for wheezing. 12/09/10 12/04/11  Antonieta Iba, MD  albuterol (VENTOLIN HFA) 108 (90 Base) MCG/ACT inhaler Inhale 2-4 puffs by mouth every 4 hours as needed for wheezing, cough, and/or shortness of breath 12/27/20   Loleta Rose, MD  albuterol (VENTOLIN HFA) 108 (90 Base) MCG/ACT inhaler Inhale 2 puffs into the lungs every 6 (six) hours as needed for wheezing or shortness of breath. 07/27/21   Jene Every, MD  albuterol (VENTOLIN HFA) 108 (90 Base) MCG/ACT inhaler Inhale 2 puffs into the lungs every 6 (six) hours as needed for wheezing or shortness of breath. 04/22/22   Georga Hacking, MD  albuterol (VENTOLIN HFA) 108 (90 Base) MCG/ACT inhaler Inhale 2 puffs into the lungs every 6 (six) hours as needed for wheezing  or shortness of breath. 05/29/22   Cuthriell, Delorise Royals, PA-C  benzonatate (TESSALON PERLES) 100 MG capsule Take 1 capsule (100 mg total) by mouth 3 (three) times daily as needed for cough. 05/29/22 05/29/23  Cuthriell, Delorise Royals, PA-C  brompheniramine-pseudoephedrine-DM 30-2-10 MG/5ML syrup Take 10 mLs by mouth 4 (four) times daily as needed. 05/29/22    Cuthriell, Delorise Royals, PA-C  budesonide-formoterol (SYMBICORT) 80-4.5 MCG/ACT inhaler Inhale 2 puffs into the lungs in the morning and at bedtime. 04/22/22   Georga Hacking, MD  chlorpheniramine-HYDROcodone [facility] PENNKINETIC ER) 10-8 MG/5ML SUER Take 5 mLs by mouth every 12 (twelve) hours as needed for cough. 12/27/20   Loleta Rose, MD  cyclobenzaprine (FLEXERIL) 10 MG tablet Take 1 tablet (10 mg total) by mouth 3 (three) times daily as needed. 01/22/23   Fisher, Roselyn Bering, PA-C  diazepam (VALIUM) 2 MG tablet Take 1 tablet (2 mg total) by mouth every 8 (eight) hours as needed for muscle spasms. 10/18/22   Irean Hong, MD  guaiFENesin-codeine 100-10 MG/5ML syrup Take 5 mLs by mouth every 6 (six) hours as needed for cough. 06/22/22   Minna Antis, MD  hydrOXYzine (ATARAX/VISTARIL) 25 MG tablet hydroxyzine HCl 25 mg tablet  TAKE 1 TO 2 TABLETS BY MOUTH EVERY DAY AT BEDTIME AS NEEDED    [provider]  ibuprofen (ADVIL) 800 MG tablet Take 1 tablet (800 mg total) by mouth every 8 (eight) hours as needed. 11/11/22   Romeo Apple, Myah A, PA-C  ipratropium-albuterol (DUONEB) 0.5-2.5 (3) MG/3ML SOLN Take 3 mLs by nebulization every 6 (six) hours as needed. 02/15/22   Raspet, Erin K, PA-C  lidocaine (LIDODERM) 5 % Place 1 patch onto the skin daily. Remove & Discard patch within 12 hours or as directed by MD 10/18/22   Irean Hong, MD  magic mouthwash w/lidocaine SOLN Take 5 mLs by mouth 4 (four) times daily as needed for mouth pain. Swish and spit, do not swallow the solution. 12/27/20   Loleta Rose, MD  methylPREDNISolone (MEDROL DOSEPAK) 4 MG TBPK tablet Take as directed 10/18/22   Irean Hong, MD  mirtazapine (REMERON) 15 MG tablet mirtazapine 15 mg tablet  TAKE 1 TABLET BY MOUTH EVERY DAY AT BEDTIME    [provider]  norethindrone (MICRONOR) 0.35 MG tablet Take 1 tablet (0.35 mg total) by mouth daily. 08/25/20   Tresea Mall, CNM  ondansetron (ZOFRAN-ODT) 4 MG disintegrating  tablet Take 1 tablet (4 mg total) by mouth every 6 (six) hours as needed for nausea or vomiting. 02/07/22   Eusebio Friendly B, PA-C  oxyCODONE (ROXICODONE) 5 MG immediate release tablet Take 1 tablet (5 mg total) by mouth every 8 (eight) hours as needed. 09/22/22 09/22/23  Delton Prairie, MD  oxyCODONE-acetaminophen (PERCOCET/ROXICET) 5-325 MG tablet Take 1 tablet by mouth every 4 (four) hours as needed for severe pain. 10/18/22   Irean Hong, MD  traMADol (ULTRAM) 50 MG tablet Take 1 tablet (50 mg total) by mouth every 6 (six) hours as needed. 12/05/21   Fisher, Roselyn Bering, PA-C  valACYclovir (VALTREX) 1000 MG tablet Take 1,000 mg by mouth 3 (three) times daily. 07/01/20   [provider]     Vitals:   01/26/23 1851 01/26/23 1900 01/26/23 1930 01/26/23 2044  BP:  102/78 100/85   Pulse:  82 79   Resp:      Temp: 98.4 F (36.9 C)   97.8 F (36.6 C)  TempSrc: Oral   Oral  SpO2:  99% 99%    Physical Exam Vitals and nursing note reviewed.  Constitutional:      General: She is not in acute distress.    Appearance: Normal appearance.  HENT:     Head: Normocephalic and atraumatic.     Right Ear: Hearing and external ear normal.     Left Ear: Hearing and external ear normal.     Nose: Nose normal. No nasal deformity.     Mouth/Throat:     Lips: Pink.     Mouth: Mucous membranes are moist.     Tongue: No lesions. Tongue does not deviate from midline.     Pharynx: Oropharynx is clear.  Eyes:     General: Lids are normal. No visual field deficit.    Extraocular Movements: Extraocular movements intact.     Right eye: Normal extraocular motion and no nystagmus.     Left eye: Normal extraocular motion and no nystagmus.     Pupils: Pupils are equal, round, and reactive to light.  Neck:     Thyroid: No thyroid mass.     Vascular: No carotid bruit or JVD.  Cardiovascular:     Rate and Rhythm: Normal rate and regular rhythm.     Pulses: Normal pulses.     Heart sounds: Normal heart sounds.   Pulmonary:     Effort: Pulmonary effort is normal.     Breath sounds: Normal breath sounds.  Abdominal:     General: Bowel sounds are normal. There is no distension.     Palpations: Abdomen is soft. There is no mass.     Tenderness: There is no abdominal tenderness.  Musculoskeletal:     Cervical back: Normal range of motion and neck supple.     Right lower leg: No edema.     Left lower leg: No edema.  Lymphadenopathy:     Cervical: No cervical adenopathy.  Skin:    General: Skin is warm and dry.  Neurological:     General: No focal deficit present.     Mental Status: She is alert and oriented to person, place, and time.     Cranial Nerves: Cranial nerves 2-12 are intact. No cranial nerve deficit, dysarthria or facial asymmetry.     Deep Tendon Reflexes: Reflexes normal.  Psychiatric:        Attention and Perception: Attention normal.        Mood and Affect: Mood normal.        Speech: Speech normal.        Behavior: Behavior normal. Behavior is cooperative.    Labs on Admission: I have personally reviewed following labs and imaging studies  CBC: Recent Labs  Lab 01/22/23 1232 01/26/23 0826  WBC 7.0 6.7  NEUTROABS  --  4.0  HGB 13.4 13.1  HCT 38.8 37.9  MCV 94.6 93.1  PLT 230 248   Basic Metabolic Panel: Recent Labs  Lab 01/22/23 1232 01/26/23 0826  NA 139 140  K 3.7 4.4  CL 106 110  CO2 24 23  GLUCOSE 93 93  BUN 14 14  CREATININE 0.81 0.91  CALCIUM 8.9 8.9   GFR: Estimated Creatinine Clearance: 85 mL/min (by C-G formula based on SCr of 0.91 mg/dL). Liver Function Tests: Recent Labs  Lab 01/26/23 0826  AST 20  ALT 22  ALKPHOS 50  BILITOT 0.9  PROT 6.3*  ALBUMIN 3.5   No results for input(s): "LIPASE", "AMYLASE" in the last 168 hours. No results for input(s): "AMMONIA" in the  last 168 hours. Coagulation Profile: No results for input(s): "INR", "PROTIME" in the last 168 hours. Cardiac Enzymes: No results for input(s): "CKTOTAL", "CKMB",  "CKMBINDEX", "TROPONINI" in the last 168 hours. BNP (last 3 results) No results for input(s): "PROBNP" in the last 8760 hours. HbA1C: No results for input(s): "HGBA1C" in the last 72 hours. CBG: No results for input(s): "GLUCAP" in the last 168 hours. Lipid Profile: No results for input(s): "CHOL", "HDL", "LDLCALC", "TRIG", "CHOLHDL", "LDLDIRECT" in the last 72 hours. Thyroid Function Tests: No results for input(s): "TSH", "T4TOTAL", "FREET4", "T3FREE", "THYROIDAB" in the last 72 hours. Anemia Panel: No results for input(s): "VITAMINB12", "FOLATE", "FERRITIN", "TIBC", "IRON", "RETICCTPCT" in the last 72 hours. Urinalysis  Medications  0.9 %  sodium chloride infusion (has no administration in time range)  valproate (DEPACON) 1,000 mg in dextrose 5 % 50 mL IVPB (1,000 mg Intravenous New Bag/Given 01/26/23 2046)  mirtazapine (REMERON) tablet 15 mg (has no administration in time range)  valACYclovir (VALTREX) tablet 1,000 mg (has no administration in time range)  oxyCODONE (Oxy IR/ROXICODONE) immediate release tablet 5 mg (has no administration in time range)  fluticasone furoate-vilanterol (BREO ELLIPTA) 100-25 MCG/ACT 1 puff (has no administration in time range)  albuterol (PROVENTIL) (2.5 MG/3ML) 0.083% nebulizer solution 3 mL (has no administration in time range)  sodium chloride flush (NS) 0.9 % injection 3 mL (has no administration in time range)  acetaminophen (TYLENOL) tablet 650 mg (has no administration in time range)    Or  acetaminophen (TYLENOL) suppository 650 mg (has no administration in time range)  ketorolac (TORADOL) 15 MG/ML injection 15 mg (15 mg Intravenous Given 01/26/23 0834)  cefTRIAXone (ROCEPHIN) 2 g in sodium chloride 0.9 % 100 mL IVPB (0 g Intravenous Stopped 01/26/23 1045)  metoCLOPramide (REGLAN) injection 10 mg (10 mg Intravenous Given 01/26/23 0834)  sodium chloride 0.9 % bolus 1,000 mL (0 mLs Intravenous Stopped 01/26/23 1044)  iohexol (OMNIPAQUE) 350 MG/ML  injection 75 mL (75 mLs Intravenous Contrast Given 01/26/23 0940)  gadobutrol (GADAVIST) 1 MMOL/ML injection 7.5 mL (7.5 mLs Intravenous Contrast Given 01/26/23 1238)  morphine (PF) 4 MG/ML injection 4 mg (4 mg Intravenous Given 01/26/23 1435)  ketorolac (TORADOL) 15 MG/ML injection 15 mg (15 mg Intravenous Given 01/26/23 1527)  metoCLOPramide (REGLAN) injection 10 mg (10 mg Intravenous Given 01/26/23 1527)  morphine (PF) 4 MG/ML injection 4 mg (4 mg Intravenous Given 01/26/23 1527)  lidocaine-EPINEPHrine (XYLOCAINE W/EPI) 2 %-1:100000 (with pres) injection 20 mL (20 mLs Infiltration Given by Other 01/26/23 1550)  sodium chloride 0.9 % bolus 1,000 mL (1,000 mLs Intravenous New Bag/Given 01/26/23 1849)  morphine (PF) 4 MG/ML injection 4 mg (4 mg Intravenous Given 01/26/23 1939)    Radiological Exams on Admission: MR Brain W and Wo Contrast  Result Date: 01/26/2023 CLINICAL DATA:  Provided history: Mental status change, unknown cause. Neck pain, infection suspected, positive x-ray/CT. Throbbing headache. EXAM: MRI HEAD WITHOUT AND WITH CONTRAST MRI CERVICAL SPINE WITHOUT AND WITH CONTRAST TECHNIQUE: Multiplanar, multiecho pulse sequences of the brain and surrounding structures, and cervical spine, to include the craniocervical junction and cervicothoracic junction, were obtained without and with intravenous contrast. CONTRAST:  7.62mL GADAVIST GADOBUTROL 1 MMOL/ML IV SOLN COMPARISON:  Non-contrast head CT and CT angiogram head/neck performed earlier today 01/26/2023. FINDINGS: Motion degraded post-contrast imaging. Most notably, the axial T1-weighted post-contrast sequence is moderate-to-severely motion degraded. Within this limitation findings are as follows. MRI HEAD FINDINGS Brain: Cerebral volume is normal. There are a few small nonspecific foci of T2  FLAIR hyperintense signal abnormality scattered within the bilateral cerebral white matter. No cortical encephalomalacia is identified. There is no acute  infarct. No evidence of an intracranial mass. No chronic intracranial blood products. No extra-axial fluid collection. No midline shift. No pathologic intracranial enhancement identified. Vascular: Maintained flow voids within the proximal large arterial vessels. Skull and upper cervical spine: No focal suspicious marrow lesion. Sinuses/Orbits: No mass or acute finding within the imaged orbits. Small mucous retention cyst or small-volume secretions within the right sphenoid sinus. MRI CERVICAL SPINE FINDINGS Alignment: Nonspecific reversal of the expected cervical lordosis. No significant spondylolisthesis. Vertebrae: Vertebral body height is maintained. No significant marrow edema or focal suspicious osseous lesion. Cord: No signal abnormality identified within the cervical spinal cord. No pathologic spinal cord enhancement. Posterior Fossa, vertebral arteries, paraspinal tissues: Posterior fossa assessed on same-day brain MRI. Flow voids preserved within the imaged cervical vertebral arteries. No paraspinal mass or collection. Disc levels: Mild-to-moderate disc degeneration at C4-C5. No more than mild disc degeneration at the remaining cervical levels. C2-C3: No significant disc herniation or stenosis. C3-C4: No significant disc herniation or stenosis. C4-C5: Disc bulge with superimposed small central disc protrusion. Uncovertebral hypertrophy on the left. The disc protrusion effaces ventral thecal sac and mildly flattens the ventral aspect of the spinal cord. However, the dorsal CSF space is maintained within the spinal canal. No significant foraminal stenosis. C5-C6: No significant disc herniation or stenosis. C6-C7: Slight disc bulge. No significant spinal canal or foraminal stenosis. C7-T1: No significant disc herniation or stenosis. IMPRESSION: MRI brain: 1. Motion degraded post-contrast imaging. Within this limitation, findings are as follows. 2. No evidence of an acute intracranial abnormality. The  cerebellar findings described on the head CT performed earlier today were artifactual. 3. There are a few small nonspecific T2 FLAIR hyperintense chronic insults within the cerebral white matter. 4. Mild right sphenoid sinusitis. MRI cervical spine: 1. Cervical spondylosis as described. 2. Most notably at C4-C5, there is mild-to-moderate disc degeneration with a disc bulge, superimposed small central disc protrusion and left-sided uncovertebral hypertrophy. The disc protrusion effaces the ventral thecal sac and mildly flattens the ventral aspect of the spinal cord. However, the dorsal CSF space is maintained within the spinal canal. 3. Nonspecific reversal of the expected cervical lordosis. Electronically Signed   By: Jackey Loge D.O.   On: 01/26/2023 14:56   MR Cervical Spine W and Wo Contrast  Result Date: 01/26/2023 CLINICAL DATA:  Provided history: Mental status change, unknown cause. Neck pain, infection suspected, positive x-ray/CT. Throbbing headache. EXAM: MRI HEAD WITHOUT AND WITH CONTRAST MRI CERVICAL SPINE WITHOUT AND WITH CONTRAST TECHNIQUE: Multiplanar, multiecho pulse sequences of the brain and surrounding structures, and cervical spine, to include the craniocervical junction and cervicothoracic junction, were obtained without and with intravenous contrast. CONTRAST:  7.70mL GADAVIST GADOBUTROL 1 MMOL/ML IV SOLN COMPARISON:  Non-contrast head CT and CT angiogram head/neck performed earlier today 01/26/2023. FINDINGS: Motion degraded post-contrast imaging. Most notably, the axial T1-weighted post-contrast sequence is moderate-to-severely motion degraded. Within this limitation findings are as follows. MRI HEAD FINDINGS Brain: Cerebral volume is normal. There are a few small nonspecific foci of T2 FLAIR hyperintense signal abnormality scattered within the bilateral cerebral white matter. No cortical encephalomalacia is identified. There is no acute infarct. No evidence of an intracranial mass. No  chronic intracranial blood products. No extra-axial fluid collection. No midline shift. No pathologic intracranial enhancement identified. Vascular: Maintained flow voids within the proximal large arterial vessels. Skull and upper cervical spine: No  focal suspicious marrow lesion. Sinuses/Orbits: No mass or acute finding within the imaged orbits. Small mucous retention cyst or small-volume secretions within the right sphenoid sinus. MRI CERVICAL SPINE FINDINGS Alignment: Nonspecific reversal of the expected cervical lordosis. No significant spondylolisthesis. Vertebrae: Vertebral body height is maintained. No significant marrow edema or focal suspicious osseous lesion. Cord: No signal abnormality identified within the cervical spinal cord. No pathologic spinal cord enhancement. Posterior Fossa, vertebral arteries, paraspinal tissues: Posterior fossa assessed on same-day brain MRI. Flow voids preserved within the imaged cervical vertebral arteries. No paraspinal mass or collection. Disc levels: Mild-to-moderate disc degeneration at C4-C5. No more than mild disc degeneration at the remaining cervical levels. C2-C3: No significant disc herniation or stenosis. C3-C4: No significant disc herniation or stenosis. C4-C5: Disc bulge with superimposed small central disc protrusion. Uncovertebral hypertrophy on the left. The disc protrusion effaces ventral thecal sac and mildly flattens the ventral aspect of the spinal cord. However, the dorsal CSF space is maintained within the spinal canal. No significant foraminal stenosis. C5-C6: No significant disc herniation or stenosis. C6-C7: Slight disc bulge. No significant spinal canal or foraminal stenosis. C7-T1: No significant disc herniation or stenosis. IMPRESSION: MRI brain: 1. Motion degraded post-contrast imaging. Within this limitation, findings are as follows. 2. No evidence of an acute intracranial abnormality. The cerebellar findings described on the head CT performed  earlier today were artifactual. 3. There are a few small nonspecific T2 FLAIR hyperintense chronic insults within the cerebral white matter. 4. Mild right sphenoid sinusitis. MRI cervical spine: 1. Cervical spondylosis as described. 2. Most notably at C4-C5, there is mild-to-moderate disc degeneration with a disc bulge, superimposed small central disc protrusion and left-sided uncovertebral hypertrophy. The disc protrusion effaces the ventral thecal sac and mildly flattens the ventral aspect of the spinal cord. However, the dorsal CSF space is maintained within the spinal canal. 3. Nonspecific reversal of the expected cervical lordosis. Electronically Signed   By: Jackey Loge D.O.   On: 01/26/2023 14:56   CT ANGIO HEAD NECK W WO CM  Result Date: 01/26/2023 CLINICAL DATA:  Provided history: Vertebral artery dissection suspected. Throbbing headache. Pain in neck. EXAM: CT ANGIOGRAPHY HEAD AND NECK WITH AND WITHOUT CONTRAST TECHNIQUE: Multidetector CT imaging of the head and neck was performed using the standard protocol during bolus administration of intravenous contrast. Multiplanar CT image reconstructions and MIPs were obtained to evaluate the vascular anatomy. Carotid stenosis measurements (when applicable) are obtained utilizing NASCET criteria, using the distal internal carotid diameter as the denominator. RADIATION DOSE REDUCTION: This exam was performed according to the departmental dose-optimization program which includes automated exposure control, adjustment of the mA and/or kV according to patient size and/or use of iterative reconstruction technique. CONTRAST:  75mL OMNIPAQUE IOHEXOL 350 MG/ML SOLN COMPARISON:  Head CT 12/06/2022. FINDINGS: CT HEAD FINDINGS Brain: Cerebral volume is normal. Apparent foci of abnormal hypodensity within the inferior cerebellar hemispheres bilaterally (for instance as seen on series 15, image 50) (series 3, image 9) (series 16, images 21 and 36). Partially empty sella  turcica. There is no acute intracranial hemorrhage. No demarcated cortical infarct. No extra-axial fluid collection. No evidence of an intracranial mass. No midline shift. Vascular: No hyperdense vessel. Skull: No calvarial fracture or aggressive osseous lesion. Sinuses/Orbits: No orbital mass or acute orbital finding. No significant paranasal sinus disease. Review of the MIP images confirms the above findings CTA NECK FINDINGS Aortic arch: Common origin of the innominate and left common carotid arteries. Streak/beam hardening artifact arising from  a dense right-sided contrast bolus partially obscures the right subclavian artery. Within this limitation, there is no appreciable hemodynamically significant innominate or proximal subclavian artery stenosis. Right carotid system: CCA and ICA patent within the neck without stenosis or significant atherosclerotic disease. No evidence of dissection Left carotid system: CCA and ICA patent within the neck without stenosis or significant atherosclerotic disease. No evidence of dissection Vertebral arteries: Vertebral arteries codominant and patent within the neck without stenosis or significant atherosclerotic disease. No evidence of dissection. Skeleton: Nonspecific reversal of the expected cervical lordosis. No acute fracture or aggressive osseous lesion. Other neck: No neck mass or cervical lymphadenopathy Upper chest: No consolidation within the imaged lung apices. Right apical bleb/bulla measuring 1.9 cm. Review of the MIP images confirms the above findings CTA HEAD FINDINGS Anterior circulation: The intracranial internal carotid arteries are patent. The M1 middle cerebral arteries are patent. No M2 proximal branch occlusion or high-grade proximal stenosis. The anterior cerebral arteries are patent. No intracranial aneurysm is identified. Posterior circulation: The intracranial vertebral arteries are patent. The right posteroinferior cerebellar artery is poorly  delineated. This may be due to developmentally small vessel size. However, high-grade stenosis or vessel occlusion cannot be excluded. The basilar artery is patent. The posterior cerebral arteries are patent. A small left posterior communicating artery is present. The right posterior communicating artery is diminutive or absent. Venous sinuses: Within the limitations of contrast timing, no convincing thrombus. Anatomic variants: As described. Review of the MIP images confirms the above findings CT head impression #1 and CTA head impression #1 were called by telephone at the time of interpretation on 01/26/2023 at 11:19 am to provider PHILLIP STAFFORD , who verbally acknowledged these results. IMPRESSION: CT head: 1. Apparent foci of abnormal hypodensity within the inferior cerebellar hemispheres bilaterally. Differential considerations include acute infarcts, cerebellitis, edema from other acute process and artifact. A brain MRI (with and without contrast is recommended for further evaluation. 2. Partially empty sella turcica. This finding can reflect incidental anatomic variation, or alternatively, it can be associated with idiopathic intracranial hypertension (pseudotumor cerebri). CTA neck: 1. The common carotid, internal carotid and vertebral arteries are patent within the neck without stenosis or significant atherosclerotic disease. 2. 1.9 cm bulla/bleb at the right lung apex. 3. Nonspecific reversal of the expected cervical lordosis. CTA head: 1. The right PICA is poorly delineated. This may be due to developmentally small vessel size. However, high-grade stenosis or vessel occlusion cannot be excluded. 2. Elsewhere, no intracranial large vessel occlusion or proximal high-grade arterial stenosis. Electronically Signed   By: Jackey Loge D.O.   On: 01/26/2023 11:21     Data Reviewed: Relevant notes from primary care and specialist visits, past discharge summaries as available in EHR, including Care  Everywhere. Prior diagnostic testing as pertinent to current admission diagnoses Updated medications and problem lists for reconciliation ED course, including vitals, labs, imaging, treatment and response to treatment Triage notes, nursing and pharmacy notes and ED provider's notes Notable results as noted in HPI  Assessment and Plan: * Headache Pt coming with headaches and atypical presentation with concern for meningitis.  Continue Depakote.  Cont elavil and PRN sumatriptan.  Neuro checks.  Pt underwent LP with elevated OP and with her rheumatoid arthritis pt is at risk for oppurtunistic infection.  Pt did receive rocephin in ED and per Neuro we will hold further abx.  Suspect Viral Meningitis. Appreciate Neurology Consult. CSF shows: Clear / RBC 37 / WBC 488/ lymph 91 / total  protein 117. Gram stain/ CSF culture.  Labs ordered and pending: Unresulted Labs (From admission, onward)     Start     Ordered   01/27/23 0500  Sedimentation rate  Tomorrow morning,   R        01/26/23 1935   01/27/23 0500  C-reactive protein  Tomorrow morning,   R        01/26/23 1935   01/27/23 0500  Comprehensive metabolic panel  Tomorrow morning,   R        01/26/23 2007   01/27/23 0500  CBC  Tomorrow morning,   R        01/26/23 2007   01/26/23 2003  Hemoglobin A1c  Add-on,   AD        01/26/23 2007   01/26/23 2003  TSH  Once,   R        01/26/23 2007   01/26/23 2003  T4, free  Once,   R        01/26/23 2007   01/26/23 1720  HIV Antibody (routine testing w rflx)  Once,   R        01/26/23 1720   01/26/23 1710  Pathologist smear review  Once,   R        01/26/23 1710   01/26/23 1710  Pathologist smear review  Once,   R        01/26/23 1710   01/26/23 1525  Meningitis/Encephalitis Panel (CSF)  (Meningitis Panel)  Once,   URGENT        01/26/23 1524   01/26/23 1525  IgG CSF index  (IgG CSF Index, CSF + Serum panel)  Once,   URGENT        01/26/23 1524   01/26/23 1525  Draw extra clot tube  (IgG  CSF Index, CSF + Serum panel)  Once,   URGENT        01/26/23 1524   01/26/23 1524  Oligoclonal bands, CSF + serum  (Meningitis Panel)  Once,   URGENT        01/26/23 1524   01/26/23 1524  Draw extra clot tube  (Meningitis Panel)  Once,   URGENT        01/26/23 1524   01/26/23 1524  Culture, fungus without smear  (Meningitis Panel)  Once,   URGENT        01/26/23 1524   01/26/23 1523  Culture, blood (Routine X 2)  (Meningitis Panel)  BLOOD CULTURE X 2,   STAT      01/26/23 1524          Moderate persistent asthma without complication Stable, cont Proventil and Breo Ellipta.  Continue Symbicort.   Tobacco abuse Nicotine patch. Counseling when stable.     DVT prophylaxis:  SCD's.  Consults:  Neurology.  Advance Care Planning:    Code Status: Full Code   Family Communication:  None   Disposition Plan:  Home   Severity of Illness: The appropriate patient status for this patient is OBSERVATION. Observation status is judged to be reasonable and necessary in order to provide the required intensity of service to ensure the patient's safety. The patient's presenting symptoms, physical exam findings, and initial radiographic and laboratory data in the context of their medical condition is felt to place them at decreased risk for further clinical deterioration. Furthermore, it is anticipated that the patient will be medically stable for discharge from the hospital within 2 midnights of admission.   Author: Tanna Furry  Terrilee Croak, MD 01/26/2023 8:48 PM  For on call review [email or web address].

## 2023-01-26 NOTE — Assessment & Plan Note (Signed)
Nicotine patch.  Counseling when stable.

## 2023-01-26 NOTE — ED Provider Notes (Signed)
Patient care assumed at 4 PM. Briefly, pt is a 42 yo F with h/o RA, here with neck and head pain. Suspect possible cervical pain related to RA. MRI obtained, reviewed, and shows no acute major abnormality. Neuro has been consulted. They recommended LP with outpt f/u if unremarkable. LP performed by Dr. Scotty Court.  LP is concerning for possible viral or inflammatory meningitis. Unlikely bacterial given time course. Discussed with Neurology, who recommends holding on ABX for now and will see pt in AM. Hospitalist to admit.   Shaune Pollack, MD 01/27/23 [phone]

## 2023-01-26 NOTE — ED Notes (Signed)
Pt has had two negative covid tests since HA began

## 2023-01-26 NOTE — ED Provider Notes (Signed)
[facility] Provider Note    Event Date/Time   First MD Initiated Contact with Patient 01/26/23 [phone]     (approximate)   History   Chief Complaint: Headache   HPI  Jacqueline Cooke is a 42 y.o. female with a past history of migraines, hypertension, cocaine use who comes ED complaining of a throbbing headache for the past 5 days.  Reports rapid onset, hurts down into the neck bilaterally, worse with head movement.  No vision changes paresthesias or motor weakness.  Also reports elevated body temperature as high as 99.8 at home.  Reports seeking care 3 prior times during the symptoms, was diagnosed with tension headache and migraine, treated with NSAIDs.  Denies chest pain shortness of breath cough body aches chills.  No recent alcohol or substance abuse.  No head traumas.     Physical Exam   Triage Vital Signs: ED Triage Vitals  Encounter Vitals Group     BP 01/26/23 0748 108/80     Systolic BP Percentile --      Diastolic BP Percentile --      Pulse Rate 01/26/23 0748 93     Resp 01/26/23 0748 16     Temp 01/26/23 0748 99.2 F (37.3 C)     Temp Source 01/26/23 0748 Oral     SpO2 01/26/23 0748 100 %     Weight --      Height --      Head Circumference --      Peak Flow --      Pain Score 01/26/23 0750 10     Pain Loc --      Pain Education --      Exclude from Growth Chart --     Most recent vital signs: Vitals:   01/26/23 1324 01/26/23 1326  BP:  117/74  Pulse:  79  Resp:  16  Temp: 98.1 F (36.7 C)   SpO2:  100%    General: Awake, no distress.  CV:  Good peripheral perfusion.  Regular rate rhythm Resp:  Normal effort.  Abd:  No distention.  Other:  Tenderness over bilateral SCM's reproduces her pain.  Also has tenderness over the cervical paraspinous musculature and C-spine.  Pain with any flexion or extension of the neck.  She does move her neck fairly freely side-to-side while talking. Moist oral mucosa.  No tonsillar  swelling.  No thyroid goiter or nodules.   ED Results / Procedures / Treatments   Labs (all labs ordered are listed, but only abnormal results are displayed) Labs Reviewed  SARS CORONAVIRUS 2 BY RT PCR  CSF CULTURE W GRAM STAIN  CULTURE, BLOOD (ROUTINE X 2)  CULTURE, BLOOD (ROUTINE X 2)  CULTURE, FUNGUS WITHOUT SMEAR  BASIC METABOLIC PANEL  CBC WITH DIFFERENTIAL/PLATELET  HCG, QUANTITATIVE, PREGNANCY  CSF CELL COUNT WITH DIFFERENTIAL  CSF CELL COUNT WITH DIFFERENTIAL  PROTEIN AND GLUCOSE, CSF  HIV ANTIBODY (ROUTINE TESTING W REFLEX)  OLIGOCLONAL BANDS, CSF + SERM  DRAW EXTRA CLOT TUBE  MENINGITIS/ENCEPHALITIS PANEL (CSF)  IGG CSF INDEX  DRAW EXTRA CLOT TUBE     EKG    RADIOLOGY CT head interpreted by me, negative for ICH. D/w radiologist, notable for hypodensity of cerebellum  MRI brain/ + c/spine unremarkable except c4-c5 herniated disk.   PROCEDURES:  .Lumbar Puncture  Date/Time: 01/26/2023 4:24 PM  Performed by: Sharman Cheek, MD Authorized by: Sharman Cheek, MD   Consent:    Consent obtained:  Verbal  and written   Consent given by:  Patient and spouse   Risks discussed:  Headache, bleeding, infection, pain and nerve damage Universal protocol:    Patient identity confirmed:  Verbally with patient Pre-procedure details:    Procedure purpose:  Diagnostic   Preparation: Patient was prepped and draped in usual sterile fashion   Anesthesia:    Anesthesia method:  Local infiltration   Local anesthetic:  Lidocaine 2% WITH epi Procedure details:    Lumbar space:  L3-L4 interspace   Patient position:  L lateral decubitus   Needle gauge:  20   Needle type:  Spinal needle - Quincke tip   Needle length (in):  3.5   Number of attempts:  1   Opening pressure (cm H2O):  45   Closing pressure (cm H2O):  15   Fluid appearance:  Clear   Tubes of fluid:  4   Total volume (ml):  12 Post-procedure details:    Puncture site:  Adhesive bandage applied and  direct pressure applied   Procedure completion:  Tolerated well, no immediate complications    MEDICATIONS ORDERED IN ED: Medications  lidocaine-EPINEPHrine (XYLOCAINE W/EPI) 2 %-1:100000 (with pres) injection 20 mL (has no administration in time range)  ketorolac (TORADOL) 15 MG/ML injection 15 mg (15 mg Intravenous Given 01/26/23 0834)  cefTRIAXone (ROCEPHIN) 2 g in sodium chloride 0.9 % 100 mL IVPB (0 g Intravenous Stopped 01/26/23 1045)  metoCLOPramide (REGLAN) injection 10 mg (10 mg Intravenous Given 01/26/23 0834)  sodium chloride 0.9 % bolus 1,000 mL (0 mLs Intravenous Stopped 01/26/23 1044)  iohexol (OMNIPAQUE) 350 MG/ML injection 75 mL (75 mLs Intravenous Contrast Given 01/26/23 0940)  gadobutrol (GADAVIST) 1 MMOL/ML injection 7.5 mL (7.5 mLs Intravenous Contrast Given 01/26/23 1238)  morphine (PF) 4 MG/ML injection 4 mg (4 mg Intravenous Given 01/26/23 1435)  ketorolac (TORADOL) 15 MG/ML injection 15 mg (15 mg Intravenous Given 01/26/23 1527)  metoCLOPramide (REGLAN) injection 10 mg (10 mg Intravenous Given 01/26/23 1527)  morphine (PF) 4 MG/ML injection 4 mg (4 mg Intravenous Given 01/26/23 1527)     IMPRESSION / MDM / ASSESSMENT AND PLAN / ED COURSE  I reviewed the triage vital signs and the nursing notes.  DDx: Cerebral aneurysm, intracranial mass, vertebral dissection, meningitis, dehydration, status migrainosus  Patient's presentation is most consistent with acute presentation with potential threat to life or bodily function.  Patient presents with persistent neck pain and headache, elevated body temperature without frank fever.  Vital signs are normal in the ED.  She is nontoxic, symptoms are focal to the head and neck but neuroexam is normal.  Due to the persistence of her symptoms despite prior healthcare encounters and management, will need to proceed with CT angiogram of the head and neck today.  If negative for vascular or structural cause of her symptoms, will need to  proceed with lumbar puncture.   Clinical Course as of 01/26/23 1626  Thu Jan 26, 2023  1132 CT d/w radiology, concerning for b/l cerebellar hypodensity. D/w Neuro who agrees with MRI w/wo head and c/spine, recommends deferring LP or other meds for now pending MRI. [PS]    Clinical Course User Index [PS] Sharman Cheek, MD    ----------------------------------------- 3:25 PM on 01/26/2023 ----------------------------------------- MRI reassuring, does show C4-C5 cervical herniated disc which can be the source of all the symptoms.  Discussed with neurology who notes that patient does have some immunosuppression due to Plaquenil for rheumatoid arthritis, recommends proceeding with LP.    If  she remains otherwise afebrile and cell counts protein and glucose are reassuring and she can have outpatient follow-up. (Outpatient referral to Dr. Delena Bali or Dr. Lucia Gaskins at [facility] if everything else is reassuring --they are our headache specialists if she is willing to go to [facility] )   FINAL CLINICAL IMPRESSION(S) / ED DIAGNOSES   Final diagnoses:  Herniated cervical disc     Rx / DC Orders   ED Discharge Orders     None        Note:  This document was prepared using Dragon voice recognition software and may include unintentional dictation errors.   Sharman Cheek, MD 01/26/23 1626

## 2023-01-26 NOTE — ED Notes (Signed)
Pt to MRI

## 2023-01-26 NOTE — Assessment & Plan Note (Signed)
Stable, cont Proventil and Breo Ellipta.  Continue Symbicort.

## 2023-01-26 NOTE — Consult Note (Signed)
Neurology Consultation Reason for Consult: Severe headache Requesting Physician: Alfonse Flavors  CC: Headache  History is obtained from: Patient, chart review, discussion with other providers  HPI: Jacqueline Cooke is a 42 y.o. female with a past medical history significant for rheumatoid factor positive rheumatoid arthritis on Plaquenil, remote cocaine abuse, hypertension, tobacco use, migraine headaches on topiramate  She reports she was started on topiramate in August (currently taking 50 mg every morning) due to migraine headaches and also started on rizatriptan for abortive.   She developed a very different headache starting on Friday for which she took 1 dose of rizatriptan in the evening as well as 3 additional doses on Saturday.  She visited [facility] and received Toradol without improvement and therefore came to [facility] hospital for further evaluation.  She had CTA head and neck completed which was reassuring, MRI brain and MRI cervical spine with and without contrast were also unrevealing but postcontrast MRI brain sequences were motion degraded.  On my verbal recommendations, lumbar puncture was performed which was consistent with aseptic meningitis and I was formally consulted to see the patient today  She notes that that she has not had any nausea or vomiting with her headache but she has significant pain with eye movement as well as sound sensitivity though she does not have light sensitivity.  Pain does increase with Valsalva maneuvers, and is worse with laying down so she has been sitting up.  No other focal neurological symptoms.  She feels her fingers are slightly purple this morning but no other rashes or skin changes.  She did have a low-grade fever at home.  Last BM was on Wednesday  No concern for confusion or seizure, no double vision.  Rheum eval 10/2022 noted (outside records):  Cr 0.80, AST 9, ALT 9 Normal CBC CK 51 Uric Acid 2.8 ESR 20; crp <1 Pos: RF  (24.4) Neg: ANA, 14.3.3, AntiCCP, SSA, SSB, Anti-CarP, Anti-CEP-1, Anti-Sa   ROS: All other review of systems was negative except as noted in the HPI.   Past Medical History:  Diagnosis Date   Anemia    Asthma    COPD (chronic obstructive pulmonary disease) (HCC)    Depression    Hyperphosphatemia    Hypertension    Leukocytosis    Substance abuse (HCC)    History of Cocaine abuse   Tachycardia    Past Surgical History:  Procedure Laterality Date   CESAREAN SECTION  01/04/2013   DILATION AND CURETTAGE OF UTERUS  2012   [facility] &  2013 [facility]   TRACHEOSTOMY  10/2010   Current Outpatient Medications  Medication Instructions   albuterol (PROVENTIL,VENTOLIN) 90 MCG/ACT inhaler 2 puffs, Inhalation, Every 6 hours PRN   albuterol (VENTOLIN HFA) 108 (90 Base) MCG/ACT inhaler Inhale 2-4 puffs by mouth every 4 hours as needed for wheezing, cough, and/or shortness of breath   albuterol (VENTOLIN HFA) 108 (90 Base) MCG/ACT inhaler 2 puffs, Inhalation, Every 6 hours PRN   albuterol (VENTOLIN HFA) 108 (90 Base) MCG/ACT inhaler 2 puffs, Inhalation, Every 6 hours PRN   albuterol (VENTOLIN HFA) 108 (90 Base) MCG/ACT inhaler 2 puffs, Inhalation, Every 6 hours PRN   benzonatate (TESSALON PERLES) 100 mg, Oral, 3 times daily PRN   brompheniramine-pseudoephedrine-DM 30-2-10 MG/5ML syrup 10 mLs, Oral, 4 times daily PRN   budesonide-formoterol (SYMBICORT) 80-4.5 MCG/ACT inhaler 2 puffs, Inhalation, 2 times daily   chlorpheniramine-HYDROcodone (TUSSIONEX PENNKINETIC ER) 10-8 MG/5ML SUER 5 mLs, Oral, Every 12 hours PRN  cyclobenzaprine (FLEXERIL) 10 mg, Oral, 3 times daily PRN   diazepam (VALIUM) 2 mg, Oral, Every 8 hours PRN   guaiFENesin-codeine 100-10 MG/5ML syrup 5 mLs, Oral, Every 6 hours PRN   hydrOXYzine (ATARAX/VISTARIL) 25 MG tablet hydroxyzine HCl 25 mg tablet  TAKE 1 TO 2 TABLETS BY MOUTH EVERY DAY AT BEDTIME AS NEEDED   ibuprofen (ADVIL) 800 mg, Oral, Every 8 hours PRN    ipratropium-albuterol (DUONEB) 0.5-2.5 (3) MG/3ML SOLN 3 mLs, Nebulization, Every 6 hours PRN   lidocaine (LIDODERM) 5 % 1 patch, Transdermal, Every 24 hours, Remove & Discard patch within 12 hours or as directed by MD   magic mouthwash w/lidocaine SOLN 5 mLs, Oral, 4 times daily PRN, Swish and spit, do not swallow the solution.   methylPREDNISolone (MEDROL DOSEPAK) 4 MG TBPK tablet Take as directed   mirtazapine (REMERON) 15 MG tablet mirtazapine 15 mg tablet  TAKE 1 TABLET BY MOUTH EVERY DAY AT BEDTIME   norethindrone (MICRONOR) 0.35 mg, Oral, Daily   ondansetron (ZOFRAN-ODT) 4 mg, Oral, Every 6 hours PRN   oxyCODONE (ROXICODONE) 5 mg, Oral, Every 8 hours PRN   oxyCODONE-acetaminophen (PERCOCET/ROXICET) 5-325 MG tablet 1 tablet, Oral, Every 4 hours PRN   traMADol (ULTRAM) 50 mg, Oral, Every 6 hours PRN   valACYclovir (VALTREX) 1,000 mg, Oral, 3 times daily     Family History  Family history unknown: Yes    Social History:  reports that she has been smoking cigarettes. She has never used smokeless tobacco. She reports that she does not currently use alcohol. She reports that she does not currently use drugs after having used the following drugs: "Crack" cocaine and Marijuana.   Exam: Current vital signs: BP 110/74   Pulse 96   Temp 98.4 F (36.9 C) (Oral)   Resp 16   SpO2 100%  Vital signs in last 24 hours: Temp:  [98.1 F (36.7 C)-99.2 F (37.3 C)] 98.4 F (36.9 C) (09/26 1851) Pulse Rate:  [70-96] 96 (09/26 1830) Resp:  [16] 16 (09/26 1830) BP: (94-122)/(64-88) 110/74 (09/26 1830) SpO2:  [91 %-100 %] 100 % (09/26 1830)   Physical Exam  Constitutional: Appears well-developed and well-nourished.  Psych: Affect anxious but appropriate and cooperative, pleasant Eyes: No scleral injection HENT: No oropharyngeal obstruction. ?Geographic tongue.  Significant pain with any neck movement MSK: no major joint deformities.  Cardiovascular: Perfusing extremities  well Respiratory: Effort normal, non-labored breathing.  Slight intermittent cough GI: Soft.  No distension. There is no tenderness.  Skin: Warm dry and intact visible skin  Neuro: Mental Status: Patient is awake, alert, oriented to person, place, month, year, and situation.  Able to recall information given to her by other providers, asks excellent questions Patient is able to give a clear and coherent history. No signs of aphasia or neglect Cranial Nerves: II: Visual Fields are full. Pupils are equal, round, and reactive to light.   III,IV, VI: EOMI without ptosis, however diplopia evaluation limited by significant pain with eye movement V: Facial sensation is symmetric to temperature VII: Facial movement is symmetric.  VIII: hearing is intact to voice X: Uvula elevates symmetrically XI: Shoulder shrug is symmetric, but limited by pain. XII: tongue is midline without atrophy or fasciculations.  Motor: Tone is normal. Bulk is normal. 5/5 strength was present in all four extremities, within limits of worsening pain with Valsalva and pain due to her arthritis, particularly in the right knee Sensory: Sensation is symmetric to light touch and temperature in the  arms and legs. Deep Tendon Reflexes: 3+ and symmetric in the brachioradialis and patellae.  Cerebellar: Finger-to-nose intact bilaterally Gait:  Deferred due to pain with ambulation   I have reviewed labs in epic and the results pertinent to this consultation are:   CSF   opening pressure 45 closing pressure 15 in left lateral decubitus, clear fluid WBC 399/488 (96%/91% lymphocytes), RBC 37/2, Pro 117, Glu 52,   M/E panel positive for HSV2 -- negative for cryptococcus, CMV, enterovirus, initiation coli K1, H. influenzae, HSV-1, HHV-6, human per echovirus, Listeria, Neisseria, Streptococcus agalactiae and pneumoniae, VZV  ESR 14  I have reviewed the images obtained:  CT head personally reviewed, agree with radiology:     1. Apparent foci of abnormal hypodensity within the inferior cerebellar hemispheres bilaterally. Differential considerations include acute infarcts, cerebellitis, edema from other acute process and artifact. A brain MRI (with and without contrast is recommended for further evaluation. [Artifact based on subsequent MRI findings] 2. Partially empty sella turcica. This finding can reflect incidental anatomic variation, or alternatively, it can be associated with idiopathic intracranial hypertension (pseudotumor cerebri).   CTA neck: 1. The common carotid, internal carotid and vertebral arteries are patent within the neck without stenosis or significant atherosclerotic disease. 2. 1.9 cm bulla/bleb at the right lung apex. 3. Nonspecific reversal of the expected cervical lordosis.   CTA head personally reviewed, agree with radiology:   1. The right PICA is poorly delineated. This may be due to developmentally small vessel size. However, high-grade stenosis or vessel occlusion cannot be excluded. 2. Elsewhere, no intracranial large vessel occlusion or proximal high-grade arterial stenosis. [No beading etc suggestive of vasculitis]  MRI brain personally reviewed, agree with radiology:   1. Motion degraded post-contrast imaging. Within this limitation, findings are as follows. 2. No evidence of an acute intracranial abnormality. The cerebellar findings described on the head CT performed earlier today were artifactual. 3. There are a few small nonspecific T2 FLAIR hyperintense chronic insults within the cerebral white matter. 4. Mild right sphenoid sinusitis.   MRI cervical spine personally reviewed, agree with radiology:   1. Cervical spondylosis as described. 2. Most notably at C4-C5, there is mild-to-moderate disc degeneration with a disc bulge, superimposed small central disc protrusion and left-sided uncovertebral hypertrophy. The disc protrusion effaces the ventral thecal sac  and mildly flattens the ventral aspect of the spinal cord. However, the dorsal CSF space is maintained within the spinal canal. 3. Nonspecific reversal of the expected cervical lordosis.   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  My preliminary verbal recommendations had included: -MRI brain with and without contrast -MRI C-spine with and without contrast -lumbar puncture with opening pressure, cell counts, protein, glucose, IgG index, oligoclonal bands, meningitis/encephalitis PCR panel (bacterial and fungal cultures also obtained) -Depakote 1000 mg for headache management after negative pregnancy test -Serological testing: ESR, CRP, anti-dsDNA, ANCA profile, IgG4, Lyme serology, RPR, TB QuantiFERON gold, cryoglobulins  -Awaiting meningitis/encephalitis panel to decide on antimicrobial treatment versus steroid initiation given potential for viral or autoimmune underlying process (less likely fungal given normal glucose) and chronicity of symptoms  Additional Recommendations:  # 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 Allena Katz, MD, PharmD; Rheumatologist, [phone] for close follow-up  # Headache management: -Treatment of headache as above -Increase mirtazapine from 50 mg nightly to 30 mg nightly -Increase topiramate to 25 mg in the morning,  50 mg in the evening -Okay to continue Depakote 500 mg IV for now -Morphine IV 4 mg 2 doses only today, oxycodone discontinued, patient counseled on risk of rebound headaches and that IV pain medications will not be continued going forward as this would be expected to continue to worsen headache if continued -Scheduled Tylenol 1000 mg every 8 hours for 3 days, may be discontinued early if headache resolves -Scheduled ketorolac 15 mg, Compazine 5 mg, Benadryl 12.5 mg every 6 hours for 3 days,  and again may be discontinued early if headache resolves; there is also potential room to uptitrate doses based on response and labs  Discussed with Dr. Myriam Forehand in person  Brooke Dare MD-PhD [facility] [phone] [facility] coverage for [facility] is from 8 AM to 4 AM in-house and 4 PM to 8 PM by telephone/video. 8 PM to 8 AM emergent questions or overnight urgent questions should be addressed to Teleneurology On-call or [facility] neurohospitalist; contact information can be found on AMION

## 2023-01-26 NOTE — Plan of Care (Signed)
  These are curbside recommendations based upon the information readily available in the chart on brief review as well as history and examination information provided to me by requesting provider and do not replace a full detailed consult  Per Dr. Scotty Court, pt came with 5 days of neck pain and stiffness, Temp at home of 99.8. no other sx. was seen previously and dx with tension/migraine, given nsaids without relief. Discussed with Dr. Scotty Court who reports this patient has a very reassuring neurological examination, although  she had severe diffuse pain including neck pain and neck rigidity.  I was initially consulted for CT findings concerning for hypodensities which did not seem to match the patient's examination/history without any cerebellar symptoms on history or exam  Therefore I recommended MRI cervical spine and brain with and without contrast to rule out acute etiologies of her symptoms and assess whether the CT findings were artifact.  MRI findings do confirm CT head findings were artifact and are overall reassuring.  However on further review of her chart it does seem like she is immunosuppressed -- rheumatoid factor positive on Plaquenil. So although imaging is very reassuring, to rule out all dangerous etiologies given the severity of her symptoms and her recurrent presentations I think a lumbar puncture with opening pressure, cell counts, protein, glucose, IgG index, oligoclonal bands, meningitis/encephalitis PCR panel would be helpful. If she remains otherwise afebrile and opening pressure, cell counts protein and glucose are reassuring and she can have outpatient follow-up -- Dr. Delena Bali or Dr. Lucia Gaskins at [facility] in [place] would be recommended within our practice if she is willing/able to go to [facility]  Discussed with Dr. Scotty Court via phone and secure chat and Dr. Penne Lash via secure chat; approximately 15 minutes were spent, the majority in direct discussion  Brooke Dare MD-PhD [facility] [phone] [facility] coverage for [facility] is from 8 AM to 4 AM in-house and 4 PM to 8 PM by telephone/video. 8 PM to 8 AM emergent questions or overnight urgent questions should be addressed to Teleneurology On-call or [facility] neurohospitalist; contact information can be found on AMION

## 2023-01-26 NOTE — Assessment & Plan Note (Deleted)
Pt coming with headaches and atypical presentation with concern for meningitis.  Continue Depakote.  Cont elavil and PRN sumatriptan.  Neuro checks.  Pt underwent LP with elevated OP and with her rheumatoid arthritis pt is at risk for oppurtunistic infection.  Pt did receive rocephin in ED and per Neuro we will hold further abx.  Suspect Viral Meningitis.  Appreciate Neurology Consult.  Labs ordered and pending: Unresulted Labs (From admission, onward)     Start     Ordered   01/27/23 0500  Sedimentation rate  Tomorrow morning,   R        01/26/23 1935   01/27/23 0500  C-reactive protein  Tomorrow morning,   R        01/26/23 1935   01/27/23 0500  Comprehensive metabolic panel  Tomorrow morning,   R        01/26/23 2007   01/27/23 0500  CBC  Tomorrow morning,   R        01/26/23 2007   01/26/23 2003  Hemoglobin A1c  Add-on,   AD        01/26/23 2007   01/26/23 2003  TSH  Once,   R        01/26/23 2007   01/26/23 2003  T4, free  Once,   R        01/26/23 2007   01/26/23 1720  HIV Antibody (routine testing w rflx)  Once,   R        01/26/23 1720   01/26/23 1710  Pathologist smear review  Once,   R        01/26/23 1710   01/26/23 1710  Pathologist smear review  Once,   R        01/26/23 1710   01/26/23 1525  Meningitis/Encephalitis Panel (CSF)  (Meningitis Panel)  Once,   URGENT        01/26/23 1524   01/26/23 1525  IgG CSF index  (IgG CSF Index, CSF + Serum panel)  Once,   URGENT        01/26/23 1524   01/26/23 1525  Draw extra clot tube  (IgG CSF Index, CSF + Serum panel)  Once,   URGENT        01/26/23 1524   01/26/23 1524  Oligoclonal bands, CSF + serum  (Meningitis Panel)  Once,   URGENT        01/26/23 1524   01/26/23 1524  Draw extra clot tube  (Meningitis Panel)  Once,   URGENT        01/26/23 1524   01/26/23 1524  Culture, fungus without smear  (Meningitis Panel)  Once,   URGENT        01/26/23 1524   01/26/23 1523  Culture, blood (Routine X 2)  (Meningitis Panel)   BLOOD CULTURE X 2,   STAT      01/26/23 1524

## 2023-01-26 NOTE — Assessment & Plan Note (Addendum)
Pt coming with headaches and atypical presentation with concern for meningitis.  Continue Depakote.  Cont elavil and PRN sumatriptan.  Neuro checks.  Pt underwent LP with elevated OP and with her rheumatoid arthritis pt is at risk for oppurtunistic infection.  Pt did receive rocephin in ED and per Neuro we will hold further abx.  Suspect Viral Meningitis. Appreciate Neurology Consult. CSF shows: Clear / RBC 37 / WBC 488/ lymph 91 / total  protein 117. Gram stain/ CSF culture.  Labs ordered and pending: Unresulted Labs (From admission, onward)     Start     Ordered   01/27/23 0500  Sedimentation rate  Tomorrow morning,   R        01/26/23 1935   01/27/23 0500  C-reactive protein  Tomorrow morning,   R        01/26/23 1935   01/27/23 0500  Comprehensive metabolic panel  Tomorrow morning,   R        01/26/23 2007   01/27/23 0500  CBC  Tomorrow morning,   R        01/26/23 2007   01/26/23 2003  Hemoglobin A1c  Add-on,   AD        01/26/23 2007   01/26/23 2003  TSH  Once,   R        01/26/23 2007   01/26/23 2003  T4, free  Once,   R        01/26/23 2007   01/26/23 1720  HIV Antibody (routine testing w rflx)  Once,   R        01/26/23 1720   01/26/23 1710  Pathologist smear review  Once,   R        01/26/23 1710   01/26/23 1710  Pathologist smear review  Once,   R        01/26/23 1710   01/26/23 1525  Meningitis/Encephalitis Panel (CSF)  (Meningitis Panel)  Once,   URGENT        01/26/23 1524   01/26/23 1525  IgG CSF index  (IgG CSF Index, CSF + Serum panel)  Once,   URGENT        01/26/23 1524   01/26/23 1525  Draw extra clot tube  (IgG CSF Index, CSF + Serum panel)  Once,   URGENT        01/26/23 1524   01/26/23 1524  Oligoclonal bands, CSF + serum  (Meningitis Panel)  Once,   URGENT        01/26/23 1524   01/26/23 1524  Draw extra clot tube  (Meningitis Panel)  Once,   URGENT        01/26/23 1524   01/26/23 1524  Culture, fungus without smear  (Meningitis Panel)  Once,   URGENT         01/26/23 1524   01/26/23 1523  Culture, blood (Routine X 2)  (Meningitis Panel)  BLOOD CULTURE X 2,   STAT      01/26/23 1524

## 2023-01-26 NOTE — ED Triage Notes (Signed)
Pt continues to have throbbing headache.  Pt reports that she has pain down into neck and she reports her temp at home has been 99 and she reports stiffness in her neck.

## 2023-01-26 NOTE — ED Notes (Signed)
Erma Heritage, MD, made aware of critical CSF results

## 2023-01-27 DIAGNOSIS — M069 Rheumatoid arthritis, unspecified: Secondary | ICD-10-CM | POA: Diagnosis present

## 2023-01-27 DIAGNOSIS — Z79899 Other long term (current) drug therapy: Secondary | ICD-10-CM | POA: Diagnosis not present

## 2023-01-27 DIAGNOSIS — M0579 Rheumatoid arthritis with rheumatoid factor of multiple sites without organ or systems involvement: Secondary | ICD-10-CM

## 2023-01-27 DIAGNOSIS — Z1152 Encounter for screening for COVID-19: Secondary | ICD-10-CM | POA: Diagnosis not present

## 2023-01-27 DIAGNOSIS — F17210 Nicotine dependence, cigarettes, uncomplicated: Secondary | ICD-10-CM | POA: Diagnosis present

## 2023-01-27 DIAGNOSIS — I10 Essential (primary) hypertension: Secondary | ICD-10-CM | POA: Diagnosis present

## 2023-01-27 DIAGNOSIS — E876 Hypokalemia: Secondary | ICD-10-CM | POA: Diagnosis present

## 2023-01-27 DIAGNOSIS — Z793 Long term (current) use of hormonal contraceptives: Secondary | ICD-10-CM | POA: Diagnosis not present

## 2023-01-27 DIAGNOSIS — F1411 Cocaine abuse, in remission: Secondary | ICD-10-CM | POA: Diagnosis present

## 2023-01-27 DIAGNOSIS — G43909 Migraine, unspecified, not intractable, without status migrainosus: Secondary | ICD-10-CM | POA: Diagnosis present

## 2023-01-27 DIAGNOSIS — B003 Herpesviral meningitis: Secondary | ICD-10-CM | POA: Diagnosis present

## 2023-01-27 DIAGNOSIS — D84821 Immunodeficiency due to drugs: Secondary | ICD-10-CM | POA: Diagnosis present

## 2023-01-27 DIAGNOSIS — J4489 Other specified chronic obstructive pulmonary disease: Secondary | ICD-10-CM | POA: Diagnosis present

## 2023-01-27 DIAGNOSIS — M5020 Other cervical disc displacement, unspecified cervical region: Secondary | ICD-10-CM | POA: Diagnosis present

## 2023-01-27 DIAGNOSIS — Z9225 Personal history of immunosupression therapy: Secondary | ICD-10-CM

## 2023-01-27 DIAGNOSIS — M47812 Spondylosis without myelopathy or radiculopathy, cervical region: Secondary | ICD-10-CM | POA: Diagnosis present

## 2023-01-27 DIAGNOSIS — T378X5A Adverse effect of other specified systemic anti-infectives and antiparasitics, initial encounter: Secondary | ICD-10-CM | POA: Diagnosis present

## 2023-01-27 DIAGNOSIS — J4540 Moderate persistent asthma, uncomplicated: Secondary | ICD-10-CM | POA: Diagnosis present

## 2023-01-27 DIAGNOSIS — Z880 Allergy status to penicillin: Secondary | ICD-10-CM | POA: Diagnosis not present

## 2023-01-27 DIAGNOSIS — Z7951 Long term (current) use of inhaled steroids: Secondary | ICD-10-CM | POA: Diagnosis not present

## 2023-01-27 LAB — COMPREHENSIVE METABOLIC PANEL
ALT: 16 U/L (ref 0–44)
AST: 10 U/L — ABNORMAL LOW (ref 15–41)
Albumin: 2.9 g/dL — ABNORMAL LOW (ref 3.5–5.0)
Alkaline Phosphatase: 45 U/L (ref 38–126)
Anion gap: 5 (ref 5–15)
BUN: 11 mg/dL (ref 6–20)
CO2: 23 mmol/L (ref 22–32)
Calcium: 8 mg/dL — ABNORMAL LOW (ref 8.9–10.3)
Chloride: 108 mmol/L (ref 98–111)
Creatinine, Ser: 0.69 mg/dL (ref 0.44–1.00)
GFR, Estimated: >60 mL/min (ref >60)
Glucose, Bld: 94 mg/dL (ref 70–99)
Potassium: 3.4 mmol/L — ABNORMAL LOW (ref 3.5–5.1)
Sodium: 136 mmol/L (ref 135–145)
Total Bilirubin: 0.4 mg/dL (ref 0.3–1.2)
Total Protein: 5.3 g/dL — ABNORMAL LOW (ref 6.5–8.1)

## 2023-01-27 LAB — CBC
HCT: 34.5 % — ABNORMAL LOW (ref 36.0–46.0)
Hemoglobin: 12 g/dL (ref 12.0–15.0)
MCH: 32.3 pg (ref 26.0–34.0)
MCHC: 34.8 g/dL (ref 30.0–36.0)
MCV: 93 fL (ref 80.0–100.0)
Platelets: 230 10*3/uL (ref 150–400)
RBC: 3.71 MIL/uL — ABNORMAL LOW (ref 3.87–5.11)
RDW: 12.2 % (ref 11.5–15.5)
WBC: 5.5 10*3/uL (ref 4.0–10.5)
nRBC: 0 % (ref 0.0–0.2)

## 2023-01-27 LAB — MAGNESIUM: Magnesium: 1.9 mg/dL (ref 1.7–2.4)

## 2023-01-27 LAB — PATHOLOGIST SMEAR REVIEW

## 2023-01-27 LAB — MENINGITIS/ENCEPHALITIS PANEL (CSF)
Cryptococcus neoformans/gattii (CSF): NOT DETECTED
Cytomegalovirus (CSF): NOT DETECTED
Enterovirus (CSF): NOT DETECTED
Escherichia coli K1 (CSF): NOT DETECTED
Haemophilus influenzae (CSF): NOT DETECTED
Herpes simplex virus 1 (CSF): NOT DETECTED
Herpes simplex virus 2 (CSF): DETECTED — AB
Human herpesvirus 6 (CSF): NOT DETECTED
Human parechovirus (CSF): NOT DETECTED
Listeria monocytogenes (CSF): NOT DETECTED
Neisseria meningitis (CSF): NOT DETECTED
Streptococcus agalactiae (CSF): NOT DETECTED
Streptococcus pneumoniae (CSF): NOT DETECTED
Varicella zoster virus (CSF): NOT DETECTED

## 2023-01-27 LAB — SEDIMENTATION RATE: Sed Rate: 14 mm/h (ref 0–20)

## 2023-01-27 LAB — RPR: RPR Ser Ql: NONREACTIVE

## 2023-01-27 LAB — C-REACTIVE PROTEIN: CRP: 0.7 mg/dL (ref <1.0)

## 2023-01-27 MED ORDER — KETOROLAC TROMETHAMINE 15 MG/ML IJ SOLN
15.0000 mg | Freq: Four times a day (QID) | INTRAMUSCULAR | Status: DC
  Administered 2023-01-27 – 2023-01-29 (×8): 15 mg via INTRAVENOUS
  Filled 2023-01-27 (×8): qty 1

## 2023-01-27 MED ORDER — POTASSIUM CHLORIDE CRYS ER 20 MEQ PO TBCR
40.0000 meq | EXTENDED_RELEASE_TABLET | Freq: Once | ORAL | Status: AC
  Administered 2023-01-27: 40 meq via ORAL
  Filled 2023-01-27: qty 2

## 2023-01-27 MED ORDER — MORPHINE SULFATE (PF) 4 MG/ML IV SOLN
4.0000 mg | INTRAVENOUS | Status: AC | PRN
  Administered 2023-01-27 (×2): 4 mg via INTRAVENOUS
  Filled 2023-01-27 (×2): qty 1

## 2023-01-27 MED ORDER — ACETAMINOPHEN 325 MG PO TABS: 650.0000 mg | ORAL_TABLET | Freq: Four times a day (QID) | ORAL | Status: DC | PRN

## 2023-01-27 MED ORDER — DEXTROSE 5 % IV SOLN
10.0000 mg/kg | Freq: Three times a day (TID) | INTRAVENOUS | Status: DC
  Administered 2023-01-27 – 2023-01-29 (×7): 750 mg via INTRAVENOUS
  Filled 2023-01-27 (×8): qty 15

## 2023-01-27 MED ORDER — TOPIRAMATE 25 MG PO TABS
25.0000 mg | ORAL_TABLET | Freq: Every day | ORAL | Status: DC
  Administered 2023-01-27 – 2023-01-29 (×3): 25 mg via ORAL
  Filled 2023-01-27 (×3): qty 1

## 2023-01-27 MED ORDER — ACETAMINOPHEN 500 MG PO TABS
1000.0000 mg | ORAL_TABLET | Freq: Three times a day (TID) | ORAL | Status: DC
  Administered 2023-01-27 – 2023-01-29 (×6): 1000 mg via ORAL
  Filled 2023-01-27 (×6): qty 2

## 2023-01-27 MED ORDER — TOPIRAMATE 25 MG PO TABS
50.0000 mg | ORAL_TABLET | Freq: Every day | ORAL | Status: DC
  Administered 2023-01-27 – 2023-01-28 (×2): 50 mg via ORAL
  Filled 2023-01-27 (×2): qty 2

## 2023-01-27 MED ORDER — ACETAMINOPHEN 500 MG PO TABS
1000.0000 mg | ORAL_TABLET | Freq: Four times a day (QID) | ORAL | Status: DC | PRN
  Administered 2023-01-27: 1000 mg via ORAL
  Filled 2023-01-27 (×2): qty 2

## 2023-01-27 MED ORDER — METHOCARBAMOL 500 MG PO TABS
500.0000 mg | ORAL_TABLET | Freq: Once | ORAL | Status: AC
  Administered 2023-01-27: 500 mg via ORAL
  Filled 2023-01-27: qty 1

## 2023-01-27 MED ORDER — PROCHLORPERAZINE EDISYLATE 10 MG/2ML IJ SOLN
5.0000 mg | Freq: Four times a day (QID) | INTRAMUSCULAR | Status: DC
  Administered 2023-01-27 – 2023-01-29 (×8): 5 mg via INTRAVENOUS
  Filled 2023-01-27 (×9): qty 1

## 2023-01-27 MED ORDER — DIPHENHYDRAMINE HCL 50 MG/ML IJ SOLN
12.5000 mg | Freq: Four times a day (QID) | INTRAMUSCULAR | Status: DC
  Administered 2023-01-27 – 2023-01-29 (×8): 12.5 mg via INTRAVENOUS
  Filled 2023-01-27 (×8): qty 1

## 2023-01-27 MED ORDER — SODIUM CHLORIDE 0.9 % IV SOLN: INTRAVENOUS | Status: DC

## 2023-01-27 MED ORDER — VALPROATE SODIUM 100 MG/ML IV SOLN
500.0000 mg | Freq: Two times a day (BID) | INTRAVENOUS | Status: DC
  Administered 2023-01-27 (×2): 500 mg via INTRAVENOUS
  Filled 2023-01-27 (×3): qty 5

## 2023-01-27 MED ORDER — MIRTAZAPINE 15 MG PO TABS
30.0000 mg | ORAL_TABLET | Freq: Every day | ORAL | Status: DC
  Administered 2023-01-27 – 2023-01-28 (×2): 30 mg via ORAL
  Filled 2023-01-27 (×2): qty 2

## 2023-01-27 MED ORDER — ACETAMINOPHEN 650 MG RE SUPP: 650.0000 mg | Freq: Four times a day (QID) | RECTAL | Status: DC | PRN

## 2023-01-27 NOTE — Progress Notes (Addendum)
Progress Note    Jacqueline Cooke  WGN:[id number] DOB: Jan 21, 1981  DOA: 01/26/2023 PCP: [facility]      Brief Narrative:    Medical records reviewed and are as summarized below:  Jacqueline Cooke is a 42 y.o. female with medical history significant for rheumatoid arthritis on Plaquenil, asthma, COPD, history of substance use disorder (cocaine use disorder), hypertension, migraine headaches on topiramate, who presented to the hospital with headache, neck pain and neck stiffness.  She was found to have acute HSV-2 meningitis.     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    HSV-2 meningitis in an immunocompromised patient: Continue IV acyclovir.  Continue IV fluids for hydration.  Analgesics as needed for pain.  Follow-up with neurologist for further recommendations.   Migraine headache: Analgesics as needed.  Continue topiramate.  She is on IV Compazine, IV Benadryl, IV Depakote and IV morphine.   Hypokalemia: Replete potassium and monitor levels   COPD/asthma: Compensated.  Continue bronchodilators   Tobacco use disorder: Continue nicotine patch History of cocaine use disorder   Diet Order             Diet Heart Room service appropriate? Yes; Fluid consistency: Thin  Diet effective now                            Consultants: Neurologist  Procedures: Lumbar puncture    Medications:    acetaminophen  1,000 mg Oral Q8H   diphenhydrAMINE  12.5 mg Intravenous Q6H   [START ON 01/29/2023] fluticasone furoate-vilanterol  1 puff Inhalation Daily   ketorolac  15 mg Intravenous Q6H   mirtazapine  30 mg Oral QHS   prochlorperazine  5 mg Intravenous Q6H   sodium chloride flush  3 mL Intravenous Q12H   topiramate  25 mg  Oral Daily   And   topiramate  50 mg Oral QHS   Continuous Infusions:  sodium chloride 100 mL/hr at 01/27/23 1105   acyclovir 750 mg (01/27/23 0948)   valproate sodium 500 mg (01/27/23 1111)     Anti-infectives (From admission, onward)    Start     Dose/Rate Route Frequency Ordered Stop   01/27/23 0230  acyclovir (ZOVIRAX) 750 mg in dextrose 5 % 250 mL IVPB        10 mg/kg  74.8 kg 265 mL/hr over 60 Minutes Intravenous Every 8 hours 01/27/23 0131     01/26/23 2200  valACYclovir (VALTREX) tablet 1,000 mg  Status:  Discontinued        1,000 mg Oral 3 times daily 01/26/23 2007 01/27/23 0130   01/26/23 0830  cefTRIAXone (ROCEPHIN) 2 g in sodium chloride 0.9 % 100 mL IVPB        2 g 200 mL/hr over 30 Minutes Intravenous  Once 01/26/23 0819 01/26/23 1045              Family Communication/Anticipated D/C date and plan/Code Status   DVT prophylaxis: SCDs Start: 01/26/23 2001     Code Status: Full Code  Family Communication: None Disposition Plan: Plan to discharge home   Status is: Inpatient Remains inpatient appropriate because: Meningitis         Subjective:   Interval events  noted.  She complains of headache, neck pain and neck stiffness.  No vomiting or diarrhea  Objective:    Vitals:   01/27/23 0444 01/27/23 0500 01/27/23 0830 01/27/23 1533  BP: 110/79  113/85 111/81  Pulse: 75  79 77  Resp: 20  14 14   Temp: 98.4 F (36.9 C)  98.4 F (36.9 C) 97.9 F (36.6 C)  TempSrc:      SpO2: 100%  97% 99%  Weight:  81.1 kg     No data found.   Intake/Output Summary (Last 24 hours) at 01/27/2023 1606 Last data filed at 01/27/2023 1534 Gross per 24 hour  Intake 1291.45 ml  Output --  Net 1291.45 ml   Filed Weights   01/27/23 0500  Weight: 81.1 kg    Exam:  GEN: NAD SKIN: Warm and dry EYES: EOMI ENT: MMM, neck stiffness, CV: RRR PULM: CTA B ABD: soft, ND, NT, +BS CNS: AAO x 3, non focal, positive Kernig's sign EXT: No edema or  tenderness        Data Reviewed:   I have personally reviewed following labs and imaging studies:  Labs: Labs show the following:   Basic Metabolic Panel: Recent Labs  Lab 01/22/23 1232 01/26/23 0826 01/27/23 0505 01/27/23 0510  NA 139 140  --  136  K 3.7 4.4  --  3.4*  CL 106 110  --  108  CO2 24 23  --  23  GLUCOSE 93 93  --  94  BUN 14 14  --  11  CREATININE 0.81 0.91  --  0.69  CALCIUM 8.9 8.9  --  8.0*  MG  --   --  1.9  --    GFR Estimated Creatinine Clearance: 105.5 mL/min (by C-G formula based on SCr of 0.69 mg/dL). Liver Function Tests: Recent Labs  Lab 01/26/23 0826 01/27/23 0510  AST 20 10*  ALT 22 16  ALKPHOS 50 45  BILITOT 0.9 0.4  PROT 6.3* 5.3*  ALBUMIN 3.5 2.9*   No results for input(s): "LIPASE", "AMYLASE" in the last 168 hours. No results for input(s): "AMMONIA" in the last 168 hours. Coagulation profile No results for input(s): "INR", "PROTIME" in the last 168 hours.  CBC: Recent Labs  Lab 01/22/23 1232 01/26/23 0826 01/27/23 0510  WBC 7.0 6.7 5.5  NEUTROABS  --  4.0  --   HGB 13.4 13.1 12.0  HCT 38.8 37.9 34.5*  MCV 94.6 93.1 93.0  PLT 230 248 230   Cardiac Enzymes: No results for input(s): "CKTOTAL", "CKMB", "CKMBINDEX", "TROPONINI" in the last 168 hours. BNP (last 3 results) No results for input(s): "PROBNP" in the last 8760 hours. CBG: No results for input(s): "GLUCAP" in the last 168 hours. D-Dimer: No results for input(s): "DDIMER" in the last 72 hours. Hgb A1c: Recent Labs    01/26/23 0826  HGBA1C 5.2   Lipid Profile: No results for input(s): "CHOL", "HDL", "LDLCALC", "TRIG", "CHOLHDL", "LDLDIRECT" in the last 72 hours. Thyroid function studies: Recent Labs    01/26/23 04-20-06  TSH 1.799   Anemia work up: No results for input(s): "VITAMINB12", "FOLATE", "FERRITIN", "TIBC", "IRON", "RETICCTPCT" in the last 72 hours. Sepsis Labs: Recent Labs  Lab 01/22/23 1232 01/26/23 0826 01/27/23 0510  WBC 7.0 6.7 5.5     Microbiology Recent Results (from the past 240 hour(s))  Resp panel by RT-PCR (RSV, Flu A&B, Covid) Anterior Nasal Swab     Status: None   Collection Time: 01/22/23 12:31 PM  Specimen: Anterior Nasal Swab  Result Value Ref Range Status   SARS Coronavirus 2 by RT PCR NEGATIVE NEGATIVE Final    Comment: (NOTE) SARS-CoV-2 target nucleic acids are NOT DETECTED.  The SARS-CoV-2 RNA is generally detectable in upper respiratory specimens during the acute phase of infection. The lowest concentration of SARS-CoV-2 viral copies this assay can detect is 138 copies/mL. A negative result does not preclude SARS-Cov-2 infection and should not be used as the sole basis for treatment or other patient management decisions. A negative result may occur with  improper specimen collection/handling, submission of specimen other than nasopharyngeal swab, presence of viral mutation(s) within the areas targeted by this assay, and inadequate number of viral copies(<138 copies/mL). A negative result must be combined with clinical observations, patient history, and epidemiological information. The expected result is Negative.  Fact Sheet for Patients:  BloggerCourse.com  Fact Sheet for Healthcare Providers:  SeriousBroker.it  This test is no t yet approved or cleared by the [place] FDA and  has been authorized for detection and/or diagnosis of SARS-CoV-2 by FDA under an Emergency Use Authorization (EUA). This EUA will remain  in effect (meaning this test can be used) for the duration of the COVID-19 declaration under Section 564(b)(1) of the Act, 21 U.S.C.section 360bbb-3(b)(1), unless the authorization is terminated  or revoked sooner.       Influenza A by PCR NEGATIVE NEGATIVE Final   Influenza B by PCR NEGATIVE NEGATIVE Final    Comment: (NOTE) The Xpert Xpress SARS-CoV-2/FLU/RSV plus assay is intended as an aid in the diagnosis of  influenza from Nasopharyngeal swab specimens and should not be used as a sole basis for treatment. Nasal washings and aspirates are unacceptable for Xpert Xpress SARS-CoV-2/FLU/RSV testing.  Fact Sheet for Patients: BloggerCourse.com  Fact Sheet for Healthcare Providers: SeriousBroker.it  This test is not yet approved or cleared by the [place] FDA and has been authorized for detection and/or diagnosis of SARS-CoV-2 by FDA under an Emergency Use Authorization (EUA). This EUA will remain in effect (meaning this test can be used) for the duration of the COVID-19 declaration under Section 564(b)(1) of the Act, 21 U.S.C. section 360bbb-3(b)(1), unless the authorization is terminated or revoked.     Resp Syncytial Virus by PCR NEGATIVE NEGATIVE Final    Comment: (NOTE) Fact Sheet for Patients: BloggerCourse.com  Fact Sheet for Healthcare Providers: SeriousBroker.it  This test is not yet approved or cleared by the [place] FDA and has been authorized for detection and/or diagnosis of SARS-CoV-2 by FDA under an Emergency Use Authorization (EUA). This EUA will remain in effect (meaning this test can be used) for the duration of the COVID-19 declaration under Section 564(b)(1) of the Act, 21 U.S.C. section 360bbb-3(b)(1), unless the authorization is terminated or revoked.  Performed at [facility], [address] [place], [place] [address]   SARS Coronavirus 2 by RT PCR (hospital order, performed in [facility] hospital lab) *cepheid single result test* Anterior Nasal Swab     Status: None   Collection Time: 01/26/23 10:45 AM   Specimen: Anterior Nasal Swab  Result Value Ref Range Status   SARS Coronavirus 2 by RT PCR NEGATIVE NEGATIVE Final    Comment: (NOTE) SARS-CoV-2 target nucleic acids are NOT DETECTED.  The SARS-CoV-2 RNA is generally detectable in upper  and lower respiratory specimens during the acute phase of infection. The lowest concentration of SARS-CoV-2 viral copies this assay can detect is 250 copies / mL. A negative result  does not preclude SARS-CoV-2 infection and should not be used as the sole basis for treatment or other patient management decisions.  A negative result may occur with improper specimen collection / handling, submission of specimen other than nasopharyngeal swab, presence of viral mutation(s) within the areas targeted by this assay, and inadequate number of viral copies (<250 copies / mL). A negative result must be combined with clinical observations, patient history, and epidemiological information.  Fact Sheet for Patients:   RoadLapTop.co.za  Fact Sheet for Healthcare Providers: http://kim-miller.com/  This test is not yet approved or  cleared by the [place] FDA and has been authorized for detection and/or diagnosis of SARS-CoV-2 by FDA under an Emergency Use Authorization (EUA).  This EUA will remain in effect (meaning this test can be used) for the duration of the COVID-19 declaration under Section 564(b)(1) of the Act, 21 U.S.C. section 360bbb-3(b)(1), unless the authorization is terminated or revoked sooner.  Performed at [facility], [address] [place], [place] [address]   CSF culture w Gram Stain     Status: None (Preliminary result)   Collection Time: 01/26/23  5:10 PM   Specimen: CSF; Cerebrospinal Fluid  Result Value Ref Range Status   Specimen Description   Final    CSF Performed at [facility], [address], [place], [place] [address]    Special Requests   Final    LP Performed at [facility], [address] [place], [place] [address]    Gram Stain   Final    WBC SEEN RED BLOOD CELLS PRESENT NO ORGANISMS SEEN Performed at [facility], [address], [place], [place] [address]     Culture   Final    NO GROWTH < 24 HOURS Performed at [facility], [address], [place], [place] [address]    Report Status PENDING  Incomplete  Culture, fungus without smear     Status: None (Preliminary result)   Collection Time: 01/26/23  5:10 PM   Specimen: CSF; Cerebrospinal Fluid  Result Value Ref Range Status   Specimen Description   Final    CSF Performed at [facility], [address] [place], [place] [address]    Special Requests   Final    LP Performed at [facility], [address], [place], [place] [address]    Culture   Final    NO FUNGUS ISOLATED AFTER 1 DAY Performed at [facility], [address], [place], [place] [address]    Report Status PENDING  Incomplete  Culture, blood (Routine X 2)     Status: None (Preliminary result)   Collection Time: 01/26/23  5:45 PM   Specimen: BLOOD  Result Value Ref Range Status   Specimen Description BLOOD BLOOD RIGHT ARM  Final   Special Requests   Final    BOTTLES DRAWN AEROBIC AND ANAEROBIC Blood Culture adequate volume   Culture   Final    NO GROWTH < 12 HOURS Performed at [facility], [address], [place], [place] [address]    Report Status PENDING  Incomplete  Culture, blood (Routine X 2)     Status: None (Preliminary result)   Collection Time: 01/26/23  8:36 PM   Specimen: BLOOD  Result Value Ref Range Status   Specimen Description BLOOD BLOOD LEFT ARM  Final   Special Requests   Final    BOTTLES DRAWN AEROBIC AND ANAEROBIC Blood Culture adequate volume  Culture   Final    NO GROWTH < 12 HOURS Performed at [facility], [address] [place], [place] [address]    Report Status PENDING  Incomplete    Procedures and diagnostic studies:  MR Brain W and Wo Contrast  Result Date: 01/26/2023 CLINICAL DATA:  Provided history: Mental status change, unknown cause. Neck pain, infection suspected, positive x-ray/CT. Throbbing headache. EXAM: MRI HEAD  WITHOUT AND WITH CONTRAST MRI CERVICAL SPINE WITHOUT AND WITH CONTRAST TECHNIQUE: Multiplanar, multiecho pulse sequences of the brain and surrounding structures, and cervical spine, to include the craniocervical junction and cervicothoracic junction, were obtained without and with intravenous contrast. CONTRAST:  7.59mL GADAVIST GADOBUTROL 1 MMOL/ML IV SOLN COMPARISON:  Non-contrast head CT and CT angiogram head/neck performed earlier today 01/26/2023. FINDINGS: Motion degraded post-contrast imaging. Most notably, the axial T1-weighted post-contrast sequence is moderate-to-severely motion degraded. Within this limitation findings are as follows. MRI HEAD FINDINGS Brain: Cerebral volume is normal. There are a few small nonspecific foci of T2 FLAIR hyperintense signal abnormality scattered within the bilateral cerebral white matter. No cortical encephalomalacia is identified. There is no acute infarct. No evidence of an intracranial mass. No chronic intracranial blood products. No extra-axial fluid collection. No midline shift. No pathologic intracranial enhancement identified. Vascular: Maintained flow voids within the proximal large arterial vessels. Skull and upper cervical spine: No focal suspicious marrow lesion. Sinuses/Orbits: No mass or acute finding within the imaged orbits. Small mucous retention cyst or small-volume secretions within the right sphenoid sinus. MRI CERVICAL SPINE FINDINGS Alignment: Nonspecific reversal of the expected cervical lordosis. No significant spondylolisthesis. Vertebrae: Vertebral body height is maintained. No significant marrow edema or focal suspicious osseous lesion. Cord: No signal abnormality identified within the cervical spinal cord. No pathologic spinal cord enhancement. Posterior Fossa, vertebral arteries, paraspinal tissues: Posterior fossa assessed on same-day brain MRI. Flow voids preserved within the imaged cervical vertebral arteries. No paraspinal mass or collection.  Disc levels: Mild-to-moderate disc degeneration at C4-C5. No more than mild disc degeneration at the remaining cervical levels. C2-C3: No significant disc herniation or stenosis. C3-C4: No significant disc herniation or stenosis. C4-C5: Disc bulge with superimposed small central disc protrusion. Uncovertebral hypertrophy on the left. The disc protrusion effaces ventral thecal sac and mildly flattens the ventral aspect of the spinal cord. However, the dorsal CSF space is maintained within the spinal canal. No significant foraminal stenosis. C5-C6: No significant disc herniation or stenosis. C6-C7: Slight disc bulge. No significant spinal canal or foraminal stenosis. C7-T1: No significant disc herniation or stenosis. IMPRESSION: MRI brain: 1. Motion degraded post-contrast imaging. Within this limitation, findings are as follows. 2. No evidence of an acute intracranial abnormality. The cerebellar findings described on the head CT performed earlier today were artifactual. 3. There are a few small nonspecific T2 FLAIR hyperintense chronic insults within the cerebral white matter. 4. Mild right sphenoid sinusitis. MRI cervical spine: 1. Cervical spondylosis as described. 2. Most notably at C4-C5, there is mild-to-moderate disc degeneration with a disc bulge, superimposed small central disc protrusion and left-sided uncovertebral hypertrophy. The disc protrusion effaces the ventral thecal sac and mildly flattens the ventral aspect of the spinal cord. However, the dorsal CSF space is maintained within the spinal canal. 3. Nonspecific reversal of the expected cervical lordosis. Electronically Signed   By: Jackey Loge D.O.   On: 01/26/2023 14:56   MR Cervical Spine W and Wo Contrast  Result Date: 01/26/2023 CLINICAL DATA:  Provided history: Mental status change, unknown cause. Neck  pain, infection suspected, positive x-ray/CT. Throbbing headache. EXAM: MRI HEAD WITHOUT AND WITH CONTRAST MRI CERVICAL SPINE WITHOUT AND  WITH CONTRAST TECHNIQUE: Multiplanar, multiecho pulse sequences of the brain and surrounding structures, and cervical spine, to include the craniocervical junction and cervicothoracic junction, were obtained without and with intravenous contrast. CONTRAST:  7.36mL GADAVIST GADOBUTROL 1 MMOL/ML IV SOLN COMPARISON:  Non-contrast head CT and CT angiogram head/neck performed earlier today 01/26/2023. FINDINGS: Motion degraded post-contrast imaging. Most notably, the axial T1-weighted post-contrast sequence is moderate-to-severely motion degraded. Within this limitation findings are as follows. MRI HEAD FINDINGS Brain: Cerebral volume is normal. There are a few small nonspecific foci of T2 FLAIR hyperintense signal abnormality scattered within the bilateral cerebral white matter. No cortical encephalomalacia is identified. There is no acute infarct. No evidence of an intracranial mass. No chronic intracranial blood products. No extra-axial fluid collection. No midline shift. No pathologic intracranial enhancement identified. Vascular: Maintained flow voids within the proximal large arterial vessels. Skull and upper cervical spine: No focal suspicious marrow lesion. Sinuses/Orbits: No mass or acute finding within the imaged orbits. Small mucous retention cyst or small-volume secretions within the right sphenoid sinus. MRI CERVICAL SPINE FINDINGS Alignment: Nonspecific reversal of the expected cervical lordosis. No significant spondylolisthesis. Vertebrae: Vertebral body height is maintained. No significant marrow edema or focal suspicious osseous lesion. Cord: No signal abnormality identified within the cervical spinal cord. No pathologic spinal cord enhancement. Posterior Fossa, vertebral arteries, paraspinal tissues: Posterior fossa assessed on same-day brain MRI. Flow voids preserved within the imaged cervical vertebral arteries. No paraspinal mass or collection. Disc levels: Mild-to-moderate disc degeneration at C4-C5.  No more than mild disc degeneration at the remaining cervical levels. C2-C3: No significant disc herniation or stenosis. C3-C4: No significant disc herniation or stenosis. C4-C5: Disc bulge with superimposed small central disc protrusion. Uncovertebral hypertrophy on the left. The disc protrusion effaces ventral thecal sac and mildly flattens the ventral aspect of the spinal cord. However, the dorsal CSF space is maintained within the spinal canal. No significant foraminal stenosis. C5-C6: No significant disc herniation or stenosis. C6-C7: Slight disc bulge. No significant spinal canal or foraminal stenosis. C7-T1: No significant disc herniation or stenosis. IMPRESSION: MRI brain: 1. Motion degraded post-contrast imaging. Within this limitation, findings are as follows. 2. No evidence of an acute intracranial abnormality. The cerebellar findings described on the head CT performed earlier today were artifactual. 3. There are a few small nonspecific T2 FLAIR hyperintense chronic insults within the cerebral white matter. 4. Mild right sphenoid sinusitis. MRI cervical spine: 1. Cervical spondylosis as described. 2. Most notably at C4-C5, there is mild-to-moderate disc degeneration with a disc bulge, superimposed small central disc protrusion and left-sided uncovertebral hypertrophy. The disc protrusion effaces the ventral thecal sac and mildly flattens the ventral aspect of the spinal cord. However, the dorsal CSF space is maintained within the spinal canal. 3. Nonspecific reversal of the expected cervical lordosis. Electronically Signed   By: Jackey Loge D.O.   On: 01/26/2023 14:56   CT ANGIO HEAD NECK W WO CM  Result Date: 01/26/2023 CLINICAL DATA:  Provided history: Vertebral artery dissection suspected. Throbbing headache. Pain in neck. EXAM: CT ANGIOGRAPHY HEAD AND NECK WITH AND WITHOUT CONTRAST TECHNIQUE: Multidetector CT imaging of the head and neck was performed using the standard protocol during bolus  administration of intravenous contrast. Multiplanar CT image reconstructions and MIPs were obtained to evaluate the vascular anatomy. Carotid stenosis measurements (when applicable) are obtained utilizing NASCET criteria, using the distal  internal carotid diameter as the denominator. RADIATION DOSE REDUCTION: This exam was performed according to the departmental dose-optimization program which includes automated exposure control, adjustment of the mA and/or kV according to patient size and/or use of iterative reconstruction technique. CONTRAST:  75mL OMNIPAQUE IOHEXOL 350 MG/ML SOLN COMPARISON:  Head CT 12/06/2022. FINDINGS: CT HEAD FINDINGS Brain: Cerebral volume is normal. Apparent foci of abnormal hypodensity within the inferior cerebellar hemispheres bilaterally (for instance as seen on series 15, image 50) (series 3, image 9) (series 16, images 21 and 36). Partially empty sella turcica. There is no acute intracranial hemorrhage. No demarcated cortical infarct. No extra-axial fluid collection. No evidence of an intracranial mass. No midline shift. Vascular: No hyperdense vessel. Skull: No calvarial fracture or aggressive osseous lesion. Sinuses/Orbits: No orbital mass or acute orbital finding. No significant paranasal sinus disease. Review of the MIP images confirms the above findings CTA NECK FINDINGS Aortic arch: Common origin of the innominate and left common carotid arteries. Streak/beam hardening artifact arising from a dense right-sided contrast bolus partially obscures the right subclavian artery. Within this limitation, there is no appreciable hemodynamically significant innominate or proximal subclavian artery stenosis. Right carotid system: CCA and ICA patent within the neck without stenosis or significant atherosclerotic disease. No evidence of dissection Left carotid system: CCA and ICA patent within the neck without stenosis or significant atherosclerotic disease. No evidence of dissection Vertebral  arteries: Vertebral arteries codominant and patent within the neck without stenosis or significant atherosclerotic disease. No evidence of dissection. Skeleton: Nonspecific reversal of the expected cervical lordosis. No acute fracture or aggressive osseous lesion. Other neck: No neck mass or cervical lymphadenopathy Upper chest: No consolidation within the imaged lung apices. Right apical bleb/bulla measuring 1.9 cm. Review of the MIP images confirms the above findings CTA HEAD FINDINGS Anterior circulation: The intracranial internal carotid arteries are patent. The M1 middle cerebral arteries are patent. No M2 proximal branch occlusion or high-grade proximal stenosis. The anterior cerebral arteries are patent. No intracranial aneurysm is identified. Posterior circulation: The intracranial vertebral arteries are patent. The right posteroinferior cerebellar artery is poorly delineated. This may be due to developmentally small vessel size. However, high-grade stenosis or vessel occlusion cannot be excluded. The basilar artery is patent. The posterior cerebral arteries are patent. A small left posterior communicating artery is present. The right posterior communicating artery is diminutive or absent. Venous sinuses: Within the limitations of contrast timing, no convincing thrombus. Anatomic variants: As described. Review of the MIP images confirms the above findings CT head impression #1 and CTA head impression #1 were called by telephone at the time of interpretation on 01/26/2023 at 11:19 am to provider PHILLIP STAFFORD , who verbally acknowledged these results. IMPRESSION: CT head: 1. Apparent foci of abnormal hypodensity within the inferior cerebellar hemispheres bilaterally. Differential considerations include acute infarcts, cerebellitis, edema from other acute process and artifact. A brain MRI (with and without contrast is recommended for further evaluation. 2. Partially empty sella turcica. This finding can  reflect incidental anatomic variation, or alternatively, it can be associated with idiopathic intracranial hypertension (pseudotumor cerebri). CTA neck: 1. The common carotid, internal carotid and vertebral arteries are patent within the neck without stenosis or significant atherosclerotic disease. 2. 1.9 cm bulla/bleb at the right lung apex. 3. Nonspecific reversal of the expected cervical lordosis. CTA head: 1. The right PICA is poorly delineated. This may be due to developmentally small vessel size. However, high-grade stenosis or vessel occlusion cannot be excluded. 2. Elsewhere, no intracranial  large vessel occlusion or proximal high-grade arterial stenosis. Electronically Signed   By: Jackey Loge D.O.   On: 01/26/2023 11:21               LOS: 0 days   Jacqueline Cooke  [facility]   Pager on [email or web address]. If 7PM-7AM, please contact night-coverage at [email or web address]     01/27/2023, 4:06 PM

## 2023-01-27 NOTE — Progress Notes (Signed)
PHARMACY - PHYSICIAN COMMUNICATION  CRITICAL VALUE ALERT - Meningitis / Encephalitis Panel  Jacqueline Cooke is an 42 y.o. female who presented to [facility] on 01/26/2023 with a chief complaint of meningitis.   Assessment: Lumbar puncture performed for concerns of meningitis / encephalitis.   Name of physician (or Provider) Contacted: Irena Cords  Current anti-infectives: Valacyclovir 1000 mg PO TID, last dose on 9/26 @ 2328.  Changes to prescribed anti-infectives recommended:   Recommendations accepted by provider - Will d/c PO Valtrex and begin Acyclovir 750 mg (10 mg/kg) IV Q8H on 9/27 @ 0200.   Results for orders placed or performed during the hospital encounter of 01/26/23  Meningitis/Encephalitis Panel (CSF) (Collected: 01/26/2023  5:10 PM)  Result Value Ref Range   Cryptococcus neoformans/gattii (CSF) NOT DETECTED NOT DETECTED   Cytomegalovirus (CSF) NOT DETECTED NOT DETECTED   Enterovirus (CSF) NOT DETECTED NOT DETECTED   Escherichia coli K1 (CSF) NOT DETECTED NOT DETECTED   Haemophilus influenzae (CSF) NOT DETECTED NOT DETECTED   Herpes simplex virus 1 (CSF) NOT DETECTED NOT DETECTED   Herpes simplex virus 2 (CSF) DETECTED (A) NOT DETECTED   Human herpesvirus 6 (CSF) NOT DETECTED NOT DETECTED   Human parechovirus (CSF) NOT DETECTED NOT DETECTED   Listeria monocytogenes (CSF) NOT DETECTED NOT DETECTED   Neisseria meningitis (CSF) NOT DETECTED NOT DETECTED   Streptococcus agalactiae (CSF) NOT DETECTED NOT DETECTED   Streptococcus pneumoniae (CSF) NOT DETECTED NOT DETECTED   Varicella zoster virus (CSF) NOT DETECTED NOT DETECTED    Angelin Cutrone D 01/27/2023  1:33 AM

## 2023-01-27 NOTE — TOC Initial Note (Signed)
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  is being treated No identified TOCneeds at this time   Expected Discharge Plan: Home/Self Care Barriers to Discharge: Continued Medical Work up   Patient Goals and CMS Choice            Expected Discharge Plan and Services   Discharge Planning Services: CM Consult   Living arrangements for the past 2 months: Single Family Home                   DME Agency: NA       HH Arranged: NA          Prior Living Arrangements/Services Living arrangements for the past 2 months: Single Family Home Lives with:: Spouse Patient language and need for interpreter reviewed:: Yes Do you feel safe going back to the place where you live?: Yes      Need for Family Participation in Patient Care: No (Comment) Care giver support system in place?: Yes (comment)   Criminal Activity/Legal Involvement Pertinent to Current Situation/Hospitalization: No - Comment as needed  Activities of Daily Living   ADL Screening (condition at time of admission) Does the patient have a NEW difficulty with bathing/dressing/toileting/self-feeding that is expected to last >3 days?: No Does the patient have a NEW difficulty with getting in/out of bed, walking, or climbing stairs that is expected to last >3 days?: No Does the patient have a NEW difficulty with communication that is expected to last >3 days?: No Is the patient deaf or have difficulty hearing?: No Does the patient have difficulty seeing, even when wearing glasses/contacts?:  No Does the patient have difficulty concentrating, remembering, or making decisions?: No  Permission Sought/Granted   Permission granted to share information with : Yes, Verbal Permission Granted              Emotional Assessment Appearance:: Appears stated age Attitude/Demeanor/Rapport: Engaged Affect (typically observed): Accepting Orientation: : Oriented to Self, Oriented to  Time, Oriented to Place, Oriented to Situation Alcohol / Substance Use: Not Applicable Psych Involvement: No (comment)  Admission diagnosis:  Herniated cervical disc [M50.20] Headache [R51.9] Patient Active Problem List   Diagnosis Date Noted   Headache 01/26/2023   Bipolar depression (HCC) 08/25/2020   Moderate persistent asthma without complication 09/25/2018   Migraine without aura and without status migrainosus, not intractable 01/28/2018   Tobacco abuse 01/28/2018   History of tracheostomy 12/20/2013   H/O: substance abuse (HCC) 08/24/2012   Tachycardia 12/09/2010   SOB (shortness of breath) 12/09/2010   PCP:  [facility] Pharmacy:   [facility][id number] [place], [place] - [address] AT [facility] OF [address] [place] [address] Phone: [phone] Fax: [phone]  [facility] [address] ([address]), [place] - [address] [place] ([place]) [place] [address] Phone: [phone] Fax: [phone]  [id number], [place] - [address] AT [facility] OF [address] [facility] [place] [address] Phone: [phone] Fax: [phone]  [facility] [address], [place] - [address] [place] [address] Phone: [phone] Fax: [phone]  [id number] [place], [place] - [address] [place] [address] Phone: [phone] Fax: [phone]  [id number] - [place], [place] - [address] AT [facility] [address] [place] [address] Phone: [phone] Fax: [phone]     Social Determinants of Health (SDOH) Social History: SDOH Screenings   Food Insecurity: No Food Insecurity (01/26/2023)  Housing: Low Risk  (01/26/2023)  Transportation Needs: No Transportation Needs (01/26/2023)  Utilities: Not At Risk (01/26/2023)  Tobacco Use: High Risk (01/26/2023)   SDOH Interventions:     Readmission Risk Interventions     No data to display

## 2023-01-27 NOTE — Plan of Care (Signed)
  Problem: Education: Goal: Knowledge of General Education information will improve Description: Including pain rating scale, medication(s)/side effects and non-pharmacologic comfort measures Outcome: Progressing   Problem: Clinical Measurements: Goal: Ability to maintain clinical measurements within normal limits will improve Outcome: Progressing Goal: Will remain free from infection Outcome: Progressing Goal: Diagnostic test results will improve Outcome: Progressing Goal: Respiratory complications will improve Outcome: Progressing Goal: Cardiovascular complication will be avoided Outcome: Progressing   Problem: Pain Managment: Goal: General experience of comfort will improve Outcome: Progressing   Problem: Safety: Goal: Ability to remain free from injury will improve Outcome: Progressing   

## 2023-01-27 NOTE — Progress Notes (Signed)
Pharmacy Antibiotic Note  Anaka Beazer is a 42 y.o. female admitted on 01/26/2023 with meningitis. HSV-2 on meningitis panel.    Pharmacy has been consulted for Acyclovir dosing.  Plan: Acyclovir 750 mg (10 mg/kg) IV Q8H ordered to start on 9/27 @ 0200.   - of note, pt was on Valacyclovir 1000 mg PO TID , will d/c and change to acyclovir.      Temp (24hrs), Avg:98.3 F (36.8 C), Min:97.8 F (36.6 C), Max:99.2 F (37.3 C)  Recent Labs  Lab 01/22/23 1232 01/26/23 0826  WBC 7.0 6.7  CREATININE 0.81 0.91    Estimated Creatinine Clearance: 85 mL/min (by C-G formula based on SCr of 0.91 mg/dL).    Allergies  Allergen Reactions   Penicillins Rash and Hives    Rash     Antimicrobials this admission:   >>    >>   Dose adjustments this admission:   Microbiology results:  BCx:   UCx:    Sputum:    MRSA PCR:   Thank you for allowing pharmacy to be a part of this patient's care.  Felicity Penix D 01/27/2023 1:35 AM

## 2023-01-27 NOTE — Plan of Care (Signed)
  Problem: Education: Goal: Knowledge of General Education information will improve Description: Including pain rating scale, medication(s)/side effects and non-pharmacologic comfort measures Outcome: Progressing   Problem: Health Behavior/Discharge Planning: Goal: Ability to manage health-related needs will improve Outcome: Progressing   Problem: Clinical Measurements: Goal: Cardiovascular complication will be avoided Outcome: Progressing   Problem: Activity: Goal: Risk for activity intolerance will decrease Outcome: Progressing   Problem: Nutrition: Goal: Adequate nutrition will be maintained Outcome: Progressing   Problem: Elimination: Goal: Will not experience complications related to bowel motility Outcome: Progressing Goal: Will not experience complications related to urinary retention Outcome: Progressing   Problem: Pain Managment: Goal: General experience of comfort will improve Outcome: Progressing

## 2023-01-28 DIAGNOSIS — B003 Herpesviral meningitis: Secondary | ICD-10-CM | POA: Diagnosis not present

## 2023-01-28 LAB — BASIC METABOLIC PANEL
Anion gap: 5 (ref 5–15)
BUN: 9 mg/dL (ref 6–20)
CO2: 20 mmol/L — ABNORMAL LOW (ref 22–32)
Calcium: 8 mg/dL — ABNORMAL LOW (ref 8.9–10.3)
Chloride: 114 mmol/L — ABNORMAL HIGH (ref 98–111)
Creatinine, Ser: 0.76 mg/dL (ref 0.44–1.00)
GFR, Estimated: >60 mL/min (ref >60)
Glucose, Bld: 90 mg/dL (ref 70–99)
Potassium: 3.6 mmol/L (ref 3.5–5.1)
Sodium: 139 mmol/L (ref 135–145)

## 2023-01-28 LAB — IGG 4: IgG, Subclass 4: 9 mg/dL (ref 2–96)

## 2023-01-28 LAB — LYME DISEASE SEROLOGY W/REFLEX: Lyme Total Antibody EIA: NEGATIVE

## 2023-01-28 LAB — MAGNESIUM: Magnesium: 2 mg/dL (ref 1.7–2.4)

## 2023-01-28 LAB — ANTI-DNA ANTIBODY, DOUBLE-STRANDED: ds DNA Ab: <1 [IU]/mL (ref 0–9)

## 2023-01-28 MED ORDER — POTASSIUM CHLORIDE CRYS ER 20 MEQ PO TBCR
40.0000 meq | EXTENDED_RELEASE_TABLET | Freq: Once | ORAL | Status: AC
  Administered 2023-01-28: 40 meq via ORAL
  Filled 2023-01-28: qty 2

## 2023-01-28 NOTE — Plan of Care (Signed)

## 2023-01-28 NOTE — Progress Notes (Signed)
Progress Note    Jacqueline Cooke  ZOX:[id number] DOB: 1980/08/23  DOA: 01/26/2023 PCP: [facility]      Brief Narrative:    Medical records reviewed and are as summarized below:  Jacqueline Cooke is a 42 y.o. female with medical history significant for rheumatoid arthritis on Plaquenil, asthma, COPD, history of substance use disorder (cocaine use disorder), hypertension, migraine headaches on topiramate, who presented to the hospital with headache, neck pain and neck stiffness.  She was found to have acute HSV-2 meningitis.     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    HSV-2 meningitis in an immunocompromised patient: Continue IV acyclovir with plan to transition to oral valacyclovir tomorrow.  Continue IV fluids while on IV acyclovir.  Plan discussed with Dr. Iver Nestle, neurologist, in person.    Migraine headache: Continue analgesics as needed for pain.   Hypokalemia: Improved.  Continue potassium repletion.   COPD/asthma: Compensated.  Continue bronchodilators   Tobacco use disorder: Continue nicotine patch History of cocaine use disorder   Diet Order             Diet Heart Room service appropriate? Yes; Fluid consistency: Thin  Diet effective now                            Consultants: Neurologist  Procedures: Lumbar puncture    Medications:    acetaminophen  1,000 mg Oral Q8H   diphenhydrAMINE  12.5 mg Intravenous Q6H   [START ON 01/29/2023] fluticasone furoate-vilanterol  1 puff Inhalation Daily   ketorolac  15 mg Intravenous Q6H   mirtazapine  30 mg Oral QHS   potassium chloride  40 mEq Oral Once   prochlorperazine  5 mg Intravenous Q6H   sodium chloride flush  3 mL Intravenous Q12H   topiramate  25  mg Oral Daily   And   topiramate  50 mg Oral QHS   Continuous Infusions:  sodium chloride 100 mL/hr at 01/28/23 1105   acyclovir 750 mg (01/28/23 1107)     Anti-infectives (From admission, onward)    Start     Dose/Rate Route Frequency Ordered Stop   01/27/23 0230  acyclovir (ZOVIRAX) 750 mg in dextrose 5 % 250 mL IVPB        10 mg/kg  74.8 kg 265 mL/hr over 60 Minutes Intravenous Every 8 hours 01/27/23 0131     01/26/23 2200  valACYclovir (VALTREX) tablet 1,000 mg  Status:  Discontinued        1,000 mg Oral 3 times daily 01/26/23 2007 01/27/23 0130   01/26/23 0830  cefTRIAXone (ROCEPHIN) 2 g in sodium chloride 0.9 % 100 mL IVPB        2 g 200 mL/hr over 30 Minutes Intravenous  Once 01/26/23 0819 01/26/23 1045              Family Communication/Anticipated D/C date and plan/Code Status   DVT prophylaxis: SCDs Start: 01/26/23 2001     Code Status: Full Code  Family Communication: None Disposition Plan: Plan to discharge home   Status is: Inpatient Remains inpatient appropriate because: Meningitis         Subjective:   Interval events noted.  She feels better  today.  Headache and neck pain are better compared to yesterday.  No nausea or vomiting.  Objective:    Vitals:   01/27/23 1533 01/27/23 2306 01/28/23 0430 01/28/23 0747  BP: 111/81 118/87  (!) 127/94  Pulse: 77 71  66  Resp: 14 18  14   Temp: 97.9 F (36.6 C) 98.3 F (36.8 C)  98.4 F (36.9 C)  TempSrc:  Oral    SpO2: 99% 97%  99%  Weight:   83.9 kg    No data found.   Intake/Output Summary (Last 24 hours) at 01/28/2023 1410 Last data filed at 01/28/2023 1405 Gross per 24 hour  Intake 1126.51 ml  Output --  Net 1126.51 ml   Filed Weights   01/27/23 0500 01/28/23 0430  Weight: 81.1 kg 83.9 kg    Exam:  GEN: NAD SKIN: Warm and dry EYES: No pallor or icterus ENT: MMM, neck stiffness is better CV: RRR PULM: CTA B ABD: soft, ND, NT, +BS CNS: AAO x 3, non focal EXT: No edema  or tenderness       Data Reviewed:   I have personally reviewed following labs and imaging studies:  Labs: Labs show the following:   Basic Metabolic Panel: Recent Labs  Lab 01/22/23 1232 01/26/23 0826 01/27/23 0505 01/27/23 0510 01/28/23 0532  NA 139 140  --  136 139  K 3.7 4.4  --  3.4* 3.6  CL 106 110  --  108 114*  CO2 24 23  --  23 20*  GLUCOSE 93 93  --  94 90  BUN 14 14  --  11 9  CREATININE 0.81 0.91  --  0.69 0.76  CALCIUM 8.9 8.9  --  8.0* 8.0*  MG  --   --  1.9  --  2.0   GFR Estimated Creatinine Clearance: 107.1 mL/min (by C-G formula based on SCr of 0.76 mg/dL). Liver Function Tests: Recent Labs  Lab 01/26/23 0826 01/27/23 0510  AST 20 10*  ALT 22 16  ALKPHOS 50 45  BILITOT 0.9 0.4  PROT 6.3* 5.3*  ALBUMIN 3.5 2.9*   No results for input(s): "LIPASE", "AMYLASE" in the last 168 hours. No results for input(s): "AMMONIA" in the last 168 hours. Coagulation profile No results for input(s): "INR", "PROTIME" in the last 168 hours.  CBC: Recent Labs  Lab 01/22/23 1232 01/26/23 0826 01/27/23 0510  WBC 7.0 6.7 5.5  NEUTROABS  --  4.0  --   HGB 13.4 13.1 12.0  HCT 38.8 37.9 34.5*  MCV 94.6 93.1 93.0  PLT 230 248 230   Cardiac Enzymes: No results for input(s): "CKTOTAL", "CKMB", "CKMBINDEX", "TROPONINI" in the last 168 hours. BNP (last 3 results) No results for input(s): "PROBNP" in the last 8760 hours. CBG: No results for input(s): "GLUCAP" in the last 168 hours. D-Dimer: No results for input(s): "DDIMER" in the last 72 hours. Hgb A1c: Recent Labs    01/26/23 0826  HGBA1C 5.2   Lipid Profile: No results for input(s): "CHOL", "HDL", "LDLCALC", "TRIG", "CHOLHDL", "LDLDIRECT" in the last 72 hours. Thyroid function studies: Recent Labs    01/26/23 04-03-06  TSH 1.799   Anemia work up: No results for input(s): "VITAMINB12", "FOLATE", "FERRITIN", "TIBC", "IRON", "RETICCTPCT" in the last 72 hours. Sepsis Labs: Recent Labs  Lab  01/22/23 1232 01/26/23 0826 01/27/23 0510  WBC 7.0 6.7 5.5    Microbiology Recent Results (from the past 240 hour(s))  Resp panel by RT-PCR (RSV, Flu A&B, Covid) Anterior  Nasal Swab     Status: None   Collection Time: 01/22/23 12:31 PM   Specimen: Anterior Nasal Swab  Result Value Ref Range Status   SARS Coronavirus 2 by RT PCR NEGATIVE NEGATIVE Final    Comment: (NOTE) SARS-CoV-2 target nucleic acids are NOT DETECTED.  The SARS-CoV-2 RNA is generally detectable in upper respiratory specimens during the acute phase of infection. The lowest concentration of SARS-CoV-2 viral copies this assay can detect is 138 copies/mL. A negative result does not preclude SARS-Cov-2 infection and should not be used as the sole basis for treatment or other patient management decisions. A negative result may occur with  improper specimen collection/handling, submission of specimen other than nasopharyngeal swab, presence of viral mutation(s) within the areas targeted by this assay, and inadequate number of viral copies(<138 copies/mL). A negative result must be combined with clinical observations, patient history, and epidemiological information. The expected result is Negative.  Fact Sheet for Patients:  BloggerCourse.com  Fact Sheet for Healthcare Providers:  SeriousBroker.it  This test is no t yet approved or cleared by the [place] FDA and  has been authorized for detection and/or diagnosis of SARS-CoV-2 by FDA under an Emergency Use Authorization (EUA). This EUA will remain  in effect (meaning this test can be used) for the duration of the COVID-19 declaration under Section 564(b)(1) of the Act, 21 U.S.C.section 360bbb-3(b)(1), unless the authorization is terminated  or revoked sooner.       Influenza A by PCR NEGATIVE NEGATIVE Final   Influenza B by PCR NEGATIVE NEGATIVE Final    Comment: (NOTE) The Xpert Xpress  SARS-CoV-2/FLU/RSV plus assay is intended as an aid in the diagnosis of influenza from Nasopharyngeal swab specimens and should not be used as a sole basis for treatment. Nasal washings and aspirates are unacceptable for Xpert Xpress SARS-CoV-2/FLU/RSV testing.  Fact Sheet for Patients: BloggerCourse.com  Fact Sheet for Healthcare Providers: SeriousBroker.it  This test is not yet approved or cleared by the [place] FDA and has been authorized for detection and/or diagnosis of SARS-CoV-2 by FDA under an Emergency Use Authorization (EUA). This EUA will remain in effect (meaning this test can be used) for the duration of the COVID-19 declaration under Section 564(b)(1) of the Act, 21 U.S.C. section 360bbb-3(b)(1), unless the authorization is terminated or revoked.     Resp Syncytial Virus by PCR NEGATIVE NEGATIVE Final    Comment: (NOTE) Fact Sheet for Patients: BloggerCourse.com  Fact Sheet for Healthcare Providers: SeriousBroker.it  This test is not yet approved or cleared by the [place] FDA and has been authorized for detection and/or diagnosis of SARS-CoV-2 by FDA under an Emergency Use Authorization (EUA). This EUA will remain in effect (meaning this test can be used) for the duration of the COVID-19 declaration under Section 564(b)(1) of the Act, 21 U.S.C. section 360bbb-3(b)(1), unless the authorization is terminated or revoked.  Performed at [facility], [address] [place], [place] [address]   SARS Coronavirus 2 by RT PCR (hospital order, performed in [facility] hospital lab) *cepheid single result test* Anterior Nasal Swab     Status: None   Collection Time: 01/26/23 10:45 AM   Specimen: Anterior Nasal Swab  Result Value Ref Range Status   SARS Coronavirus 2 by RT PCR NEGATIVE NEGATIVE Final    Comment: (NOTE) SARS-CoV-2 target nucleic acids  are NOT DETECTED.  The SARS-CoV-2 RNA is generally detectable in upper and lower respiratory specimens during the acute phase of infection. The lowest  concentration of SARS-CoV-2 viral copies this assay can detect is 250 copies / mL. A negative result does not preclude SARS-CoV-2 infection and should not be used as the sole basis for treatment or other patient management decisions.  A negative result may occur with improper specimen collection / handling, submission of specimen other than nasopharyngeal swab, presence of viral mutation(s) within the areas targeted by this assay, and inadequate number of viral copies (<250 copies / mL). A negative result must be combined with clinical observations, patient history, and epidemiological information.  Fact Sheet for Patients:   RoadLapTop.co.za  Fact Sheet for Healthcare Providers: http://kim-miller.com/  This test is not yet approved or  cleared by the [place] FDA and has been authorized for detection and/or diagnosis of SARS-CoV-2 by FDA under an Emergency Use Authorization (EUA).  This EUA will remain in effect (meaning this test can be used) for the duration of the COVID-19 declaration under Section 564(b)(1) of the Act, 21 U.S.C. section 360bbb-3(b)(1), unless the authorization is terminated or revoked sooner.  Performed at [facility], [address] [place], [place] [address]   CSF culture w Gram Stain     Status: None (Preliminary result)   Collection Time: 01/26/23  5:10 PM   Specimen: CSF; Cerebrospinal Fluid  Result Value Ref Range Status   Specimen Description   Final    CSF Performed at [facility], [address] [place], [place] [address]    Special Requests   Final    LP Performed at [facility], [address] [place], [place] [address]    Gram Stain   Final    WBC SEEN RED BLOOD CELLS PRESENT NO ORGANISMS SEEN Performed  at [facility], [address] [place], [place] [address]    Culture   Final    NO GROWTH 2 DAYS Performed at [facility], [address], [place], [place] [address]    Report Status PENDING  Incomplete  Culture, fungus without smear     Status: None (Preliminary result)   Collection Time: 01/26/23  5:10 PM   Specimen: CSF; Cerebrospinal Fluid  Result Value Ref Range Status   Specimen Description   Final    CSF Performed at [facility], [address], [place], [place] [address]    Special Requests   Final    LP Performed at [facility], [address] [place], [place] [address]    Culture   Final    NO FUNGUS ISOLATED AFTER 2 DAYS Performed at [facility], [address], [place], [place] [address]    Report Status PENDING  Incomplete  Culture, blood (Routine X 2)     Status: None (Preliminary result)   Collection Time: 01/26/23  5:45 PM   Specimen: BLOOD  Result Value Ref Range Status   Specimen Description BLOOD BLOOD RIGHT ARM  Final   Special Requests   Final    BOTTLES DRAWN AEROBIC AND ANAEROBIC Blood Culture adequate volume   Culture   Final    NO GROWTH 2 DAYS Performed at [facility], [address] [place], [place] [address]    Report Status PENDING  Incomplete  Culture, blood (Routine X 2)     Status: None (Preliminary result)   Collection Time: 01/26/23  8:36 PM   Specimen: BLOOD  Result Value Ref Range Status   Specimen Description BLOOD BLOOD LEFT ARM  Final   Special Requests  Final    BOTTLES DRAWN AEROBIC AND ANAEROBIC Blood Culture adequate volume   Culture   Final    NO GROWTH 2 DAYS Performed at [facility], [address] [place], [place] [address]    Report Status PENDING  Incomplete    Procedures and diagnostic studies:  No results found.             LOS: 1 day   Jacqueline Cooke  [facility]   Pager on [email or web address]. If 7PM-7AM, please contact  night-coverage at [email or web address]     01/28/2023, 2:10 PM

## 2023-01-28 NOTE — Plan of Care (Signed)

## 2023-01-28 NOTE — Progress Notes (Signed)
Neurology Progress Note  Subjective: -Marked improvement in symptoms, still has headache when she tries to ambulate  Exam: Vitals:   01/28/23 0747 01/28/23 1505  BP: (!) 127/94 115/74  Pulse: 66 86  Resp: 14 14  Temp: 98.4 F (36.9 C) 98.4 F (36.9 C)  SpO2: 99% 100%   Gen: In bed, much more comfortable, family at bedside   HEENT: Much improved active range of motion of the neck and head without wincing Resp: non-labored breathing, no grossly audible wheezing Cardiac: Perfusing extremities well  Abd: soft, nt  Neuro: MS: Awake, alert, oriented, following commands, fluent speech CN: EMOI, face symmetric, tongue midline, hearing intact to voice  Motor: Using bilateral upper extremities spontaneously, equally  Sensory:Intact to light touch throughout    Pertinent Labs:  Basic Metabolic Panel: Recent Labs  Lab 01/22/23 1232 01/26/23 0826 01/27/23 0505 01/27/23 0510 01/28/23 0532  NA 139 140  --  136 139  K 3.7 4.4  --  3.4* 3.6  CL 106 110  --  108 114*  CO2 24 23  --  23 20*  GLUCOSE 93 93  --  94 90  BUN 14 14  --  11 9  CREATININE 0.81 0.91  --  0.69 0.76  CALCIUM 8.9 8.9  --  8.0* 8.0*  MG  --   --  1.9  --  2.0    CBC: Recent Labs  Lab 01/22/23 1232 01/26/23 0826 01/27/23 0510  WBC 7.0 6.7 5.5  NEUTROABS  --  4.0  --   HGB 13.4 13.1 12.0  HCT 38.8 37.9 34.5*  MCV 94.6 93.1 93.0  PLT 230 248 230    Coagulation Studies: No results for input(s): "LABPROT", "INR" in the last 72 hours.    Lyme neg RPR neg ESR/CRP normal Anti-ds-DNA neg   Unresulted Labs (From admission, onward)     Start     Ordered   01/27/23 0500  ANCA Profile  Tomorrow morning,   R        01/26/23 2158   01/27/23 0500  IgG 4  Tomorrow morning,   R        01/26/23 2158   01/27/23 0500  QuantiFERON-TB Gold Plus  Tomorrow morning,   R        01/26/23 2158   01/27/23 0500  Cryoglobulin  Tomorrow morning,   R        01/26/23 2158   01/27/23 0500  Miscellaneous [facility]  test (send-out)  Tomorrow morning,   R       Question:  Test name / description:  Answer:  Anti-Myelin Oligodendrocyte Glycoprotein (MOG), Serum [facility] TEST: [id number]   01/26/23 2158   01/26/23 1525  IgG CSF index  (IgG CSF Index, CSF + Serum panel)  Once,   URGENT        01/26/23 1524   01/26/23 1525  Draw extra clot tube  (IgG CSF Index, CSF + Serum panel)  Once,   URGENT        01/26/23 1524   01/26/23 1524  Oligoclonal bands, CSF + serum  (Meningitis Panel)  Once,   URGENT        01/26/23 1524   01/26/23 1524  Draw extra clot tube  (Meningitis Panel)  Once,   URGENT        01/26/23 1524             Impression: Improving HSV2 meningitis   Recommendations: -# 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 If she continues to clinically improve in discussion with Dr. Rivka Safer (ID) and Dr. Myriam Forehand it is reasonable to consider discharge on oral agents with plan for BMP for renal function monitoring (coordinate with PCP next week)  - Hold Plaquenil - close follow-up with rheumatology including followup of above pending labs    # Headache management: -Treatment of headache as above -Increase mirtazapine from 15 mg nightly to 30 mg nightly; may be de-escalated to 15 mg if patient prefers as symptoms improve or continued at 30 mg (per patient preference)  -Continue topiramate to 25 mg in the morning, 50 mg in the evening (increased from home dose) -Depakote 500 mg IV BID discontinued  -Patient doing well off of opiates  -Scheduled Tylenol 1000 mg every 8 hours for 3 days, may be discontinued early if headache fully resolves -Scheduled ketorolac 15 mg every 6 hours) (can be substituted with ibuprofen 600 mg PO), Compazine 5 mg, Benadryl 12.5 mg every 6 hours for 3 days, and again may be discontinued early if headache resolves fully  Brooke Dare MD-PhD [facility] [phone]

## 2023-01-29 DIAGNOSIS — B003 Herpesviral meningitis: Secondary | ICD-10-CM | POA: Diagnosis not present

## 2023-01-29 MED ORDER — VALACYCLOVIR HCL 1 G PO TABS: 1000.0000 mg | ORAL_TABLET | Freq: Three times a day (TID) | ORAL | 0 refills | Status: AC

## 2023-01-29 NOTE — Plan of Care (Signed)

## 2023-01-29 NOTE — Discharge Summary (Signed)
Physician Discharge Summary   Patient: Jacqueline Cooke MRN: [id number] DOB: 11-01-1980  Admit date:     01/26/2023  Discharge date: 01/29/23  Discharge Physician: Lurene Shadow   PCP: [facility]   Recommendations at discharge:   Follow-up with PCP in 1 week Check.  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 *  Hospital Course:  Jacqueline Cooke is a 42 y.o. female with medical history significant for rheumatoid arthritis on Plaquenil, asthma, COPD, history of substance use disorder (cocaine use disorder), hypertension, migraine headaches on topiramate, who presented to the hospital with headache, neck pain and neck stiffness.   She was found to have acute HSV-2 meningitis.  Assessment and Plan:   HSV-2 meningitis in an immunocompromised patient: She was initially treated with IV acyclovir for 48 hours.  She will be discharged home valacyclovir 1 g 3 times daily for additional 12 days to complete 14 days of treatment.   Dr. Rivka Safer, ID specialist, had been curb-sided by Dr. Iver Nestle, neurologist.  In a group secure chat on 01/27/2023, Dr. Rivka Safer said it was okay to transition to oral valacyclovir.    Migraine headache: Improved.  Continue analgesics as needed for pain.     Hypokalemia: Improved.     COPD/asthma: Compensated.  Continue bronchodilators     Tobacco use disorder: Continue nicotine patch History of cocaine use disorder    Her condition has improved significantly.  Headache, neck pain, neck stiffness and low back soreness (from lumbar puncture) have improved significantly.  Patient was able to ambulate without any symptoms or worsening of her pain.  She wants to go home  today.  She is deemed stable for discharge.  Plan discussed with S[place], RN , at the bedside.            Consultants: Neurologist Procedures performed: Lumbar puncture Disposition: Home Diet recommendation:  Discharge Diet Orders (From admission, onward)     Start     Ordered   01/29/23 0000  Diet - low sodium heart healthy        01/29/23 1019           Cardiac diet DISCHARGE MEDICATION: Allergies as of 01/29/2023       Reactions   Penicillins Rash, Hives   Rash        Medication List     STOP taking these medications    predniSONE 10 MG tablet Commonly known as: DELTASONE   tiZANidine 4 MG tablet Commonly known as: ZANAFLEX       TAKE these medications    albuterol 108 (90 Base) MCG/ACT inhaler Commonly known as: VENTOLIN HFA Inhale 2 puffs into the lungs every 6 (six) hours as needed for wheezing or shortness of breath.   ARIPiprazole 5 MG tablet Commonly known as: ABILIFY Take 5 mg by mouth daily.   budesonide-formoterol 80-4.5 MCG/ACT inhaler Commonly known as: Symbicort Inhale 2 puffs into the lungs in the morning and at bedtime.   butalbital-acetaminophen-caffeine 50-325-40 MG tablet Commonly known as: FIORICET Take 1 tablet by mouth every 6 (six) hours as needed for migraine.   cyclobenzaprine 10 MG tablet Commonly known as: FLEXERIL Take 1 tablet (10 mg total) by mouth 3 (three) times daily as needed.  hydrochlorothiazide 12.5 MG capsule Commonly known as: MICROZIDE Take 12.5 mg by mouth daily.   hydroxychloroquine 200 MG tablet Commonly known as: PLAQUENIL Take 200 mg by mouth 2 (two) times daily.   ipratropium-albuterol 0.5-2.5 (3) MG/3ML Soln Commonly known as: DUONEB Take 3 mLs by nebulization every 6 (six) hours as needed.   mirtazapine 15 MG tablet Commonly known as: REMERON mirtazapine 15 mg tablet  TAKE 1 TABLET BY MOUTH EVERY DAY AT BEDTIME   SUMAtriptan 25 MG tablet Commonly known as: IMITREX Take 25 mg by  mouth every 2 (two) hours as needed for migraine.   topiramate 50 MG tablet Commonly known as: TOPAMAX Take 50 mg by mouth daily.   valACYclovir 1000 MG tablet Commonly known as: Valtrex Take 1 tablet (1,000 mg total) by mouth 3 (three) times daily for 12 days.        Follow-up Information     [facility].   Contact information: [address] [place] [address] [phone]                Discharge Exam: Filed Weights   01/27/23 0500 01/28/23 0430  Weight: 81.1 kg 83.9 kg   GEN: NAD SKIN: Warm and dry EYES: No pallor or icterus ENT: MMM, no neck stiffness CV: RRR PULM: CTA B ABD: soft, ND, NT, +BS CNS: AAO x 3, non focal EXT: No edema or tenderness   Condition at discharge: good  The results of significant diagnostics from this hospitalization (including imaging, microbiology, ancillary and laboratory) are listed below for reference.   Imaging Studies: MR Brain W and Wo Contrast  Result Date: 01/26/2023 CLINICAL DATA:  Provided history: Mental status change, unknown cause. Neck pain, infection suspected, positive x-ray/CT. Throbbing headache. EXAM: MRI HEAD WITHOUT AND WITH CONTRAST MRI CERVICAL SPINE WITHOUT AND WITH CONTRAST TECHNIQUE: Multiplanar, multiecho pulse sequences of the brain and surrounding structures, and cervical spine, to include the craniocervical junction and cervicothoracic junction, were obtained without and with intravenous contrast. CONTRAST:  7.33mL GADAVIST GADOBUTROL 1 MMOL/ML IV SOLN COMPARISON:  Non-contrast head CT and CT angiogram head/neck performed earlier today 01/26/2023. FINDINGS: Motion degraded post-contrast imaging. Most notably, the axial T1-weighted post-contrast sequence is moderate-to-severely motion degraded. Within this limitation findings are as follows. MRI HEAD FINDINGS Brain: Cerebral volume is normal. There are a few small nonspecific foci of T2 FLAIR hyperintense signal abnormality  scattered within the bilateral cerebral white matter. No cortical encephalomalacia is identified. There is no acute infarct. No evidence of an intracranial mass. No chronic intracranial blood products. No extra-axial fluid collection. No midline shift. No pathologic intracranial enhancement identified. Vascular: Maintained flow voids within the proximal large arterial vessels. Skull and upper cervical spine: No focal suspicious marrow lesion. Sinuses/Orbits: No mass or acute finding within the imaged orbits. Small mucous retention cyst or small-volume secretions within the right sphenoid sinus. MRI CERVICAL SPINE FINDINGS Alignment: Nonspecific reversal of the expected cervical lordosis. No significant spondylolisthesis. Vertebrae: Vertebral body height is maintained. No significant marrow edema or focal suspicious osseous lesion. Cord: No signal abnormality identified within the cervical spinal cord. No pathologic spinal cord enhancement. Posterior Fossa, vertebral arteries, paraspinal tissues: Posterior fossa assessed on same-day brain MRI. Flow voids preserved within the imaged cervical vertebral arteries. No paraspinal mass or collection. Disc levels: Mild-to-moderate disc degeneration at C4-C5. No more than mild disc degeneration at the remaining cervical levels. C2-C3: No significant disc herniation or stenosis. C3-C4: No significant disc herniation or stenosis. C4-C5: Disc bulge with  superimposed small central disc protrusion. Uncovertebral hypertrophy on the left. The disc protrusion effaces ventral thecal sac and mildly flattens the ventral aspect of the spinal cord. However, the dorsal CSF space is maintained within the spinal canal. No significant foraminal stenosis. C5-C6: No significant disc herniation or stenosis. C6-C7: Slight disc bulge. No significant spinal canal or foraminal stenosis. C7-T1: No significant disc herniation or stenosis. IMPRESSION: MRI brain: 1. Motion degraded post-contrast  imaging. Within this limitation, findings are as follows. 2. No evidence of an acute intracranial abnormality. The cerebellar findings described on the head CT performed earlier today were artifactual. 3. There are a few small nonspecific T2 FLAIR hyperintense chronic insults within the cerebral white matter. 4. Mild right sphenoid sinusitis. MRI cervical spine: 1. Cervical spondylosis as described. 2. Most notably at C4-C5, there is mild-to-moderate disc degeneration with a disc bulge, superimposed small central disc protrusion and left-sided uncovertebral hypertrophy. The disc protrusion effaces the ventral thecal sac and mildly flattens the ventral aspect of the spinal cord. However, the dorsal CSF space is maintained within the spinal canal. 3. Nonspecific reversal of the expected cervical lordosis. Electronically Signed   By: Jackey Loge D.O.   On: 01/26/2023 14:56   MR Cervical Spine W and Wo Contrast  Result Date: 01/26/2023 CLINICAL DATA:  Provided history: Mental status change, unknown cause. Neck pain, infection suspected, positive x-ray/CT. Throbbing headache. EXAM: MRI HEAD WITHOUT AND WITH CONTRAST MRI CERVICAL SPINE WITHOUT AND WITH CONTRAST TECHNIQUE: Multiplanar, multiecho pulse sequences of the brain and surrounding structures, and cervical spine, to include the craniocervical junction and cervicothoracic junction, were obtained without and with intravenous contrast. CONTRAST:  7.98mL GADAVIST GADOBUTROL 1 MMOL/ML IV SOLN COMPARISON:  Non-contrast head CT and CT angiogram head/neck performed earlier today 01/26/2023. FINDINGS: Motion degraded post-contrast imaging. Most notably, the axial T1-weighted post-contrast sequence is moderate-to-severely motion degraded. Within this limitation findings are as follows. MRI HEAD FINDINGS Brain: Cerebral volume is normal. There are a few small nonspecific foci of T2 FLAIR hyperintense signal abnormality scattered within the bilateral cerebral white matter.  No cortical encephalomalacia is identified. There is no acute infarct. No evidence of an intracranial mass. No chronic intracranial blood products. No extra-axial fluid collection. No midline shift. No pathologic intracranial enhancement identified. Vascular: Maintained flow voids within the proximal large arterial vessels. Skull and upper cervical spine: No focal suspicious marrow lesion. Sinuses/Orbits: No mass or acute finding within the imaged orbits. Small mucous retention cyst or small-volume secretions within the right sphenoid sinus. MRI CERVICAL SPINE FINDINGS Alignment: Nonspecific reversal of the expected cervical lordosis. No significant spondylolisthesis. Vertebrae: Vertebral body height is maintained. No significant marrow edema or focal suspicious osseous lesion. Cord: No signal abnormality identified within the cervical spinal cord. No pathologic spinal cord enhancement. Posterior Fossa, vertebral arteries, paraspinal tissues: Posterior fossa assessed on same-day brain MRI. Flow voids preserved within the imaged cervical vertebral arteries. No paraspinal mass or collection. Disc levels: Mild-to-moderate disc degeneration at C4-C5. No more than mild disc degeneration at the remaining cervical levels. C2-C3: No significant disc herniation or stenosis. C3-C4: No significant disc herniation or stenosis. C4-C5: Disc bulge with superimposed small central disc protrusion. Uncovertebral hypertrophy on the left. The disc protrusion effaces ventral thecal sac and mildly flattens the ventral aspect of the spinal cord. However, the dorsal CSF space is maintained within the spinal canal. No significant foraminal stenosis. C5-C6: No significant disc herniation or stenosis. C6-C7: Slight disc bulge. No significant spinal canal or foraminal stenosis. C7-T1:  No significant disc herniation or stenosis. IMPRESSION: MRI brain: 1. Motion degraded post-contrast imaging. Within this limitation, findings are as follows. 2.  No evidence of an acute intracranial abnormality. The cerebellar findings described on the head CT performed earlier today were artifactual. 3. There are a few small nonspecific T2 FLAIR hyperintense chronic insults within the cerebral white matter. 4. Mild right sphenoid sinusitis. MRI cervical spine: 1. Cervical spondylosis as described. 2. Most notably at C4-C5, there is mild-to-moderate disc degeneration with a disc bulge, superimposed small central disc protrusion and left-sided uncovertebral hypertrophy. The disc protrusion effaces the ventral thecal sac and mildly flattens the ventral aspect of the spinal cord. However, the dorsal CSF space is maintained within the spinal canal. 3. Nonspecific reversal of the expected cervical lordosis. Electronically Signed   By: Jackey Loge D.O.   On: 01/26/2023 14:56   CT ANGIO HEAD NECK W WO CM  Result Date: 01/26/2023 CLINICAL DATA:  Provided history: Vertebral artery dissection suspected. Throbbing headache. Pain in neck. EXAM: CT ANGIOGRAPHY HEAD AND NECK WITH AND WITHOUT CONTRAST TECHNIQUE: Multidetector CT imaging of the head and neck was performed using the standard protocol during bolus administration of intravenous contrast. Multiplanar CT image reconstructions and MIPs were obtained to evaluate the vascular anatomy. Carotid stenosis measurements (when applicable) are obtained utilizing NASCET criteria, using the distal internal carotid diameter as the denominator. RADIATION DOSE REDUCTION: This exam was performed according to the departmental dose-optimization program which includes automated exposure control, adjustment of the mA and/or kV according to patient size and/or use of iterative reconstruction technique. CONTRAST:  75mL OMNIPAQUE IOHEXOL 350 MG/ML SOLN COMPARISON:  Head CT 12/06/2022. FINDINGS: CT HEAD FINDINGS Brain: Cerebral volume is normal. Apparent foci of abnormal hypodensity within the inferior cerebellar hemispheres bilaterally (for  instance as seen on series 15, image 50) (series 3, image 9) (series 16, images 21 and 36). Partially empty sella turcica. There is no acute intracranial hemorrhage. No demarcated cortical infarct. No extra-axial fluid collection. No evidence of an intracranial mass. No midline shift. Vascular: No hyperdense vessel. Skull: No calvarial fracture or aggressive osseous lesion. Sinuses/Orbits: No orbital mass or acute orbital finding. No significant paranasal sinus disease. Review of the MIP images confirms the above findings CTA NECK FINDINGS Aortic arch: Common origin of the innominate and left common carotid arteries. Streak/beam hardening artifact arising from a dense right-sided contrast bolus partially obscures the right subclavian artery. Within this limitation, there is no appreciable hemodynamically significant innominate or proximal subclavian artery stenosis. Right carotid system: CCA and ICA patent within the neck without stenosis or significant atherosclerotic disease. No evidence of dissection Left carotid system: CCA and ICA patent within the neck without stenosis or significant atherosclerotic disease. No evidence of dissection Vertebral arteries: Vertebral arteries codominant and patent within the neck without stenosis or significant atherosclerotic disease. No evidence of dissection. Skeleton: Nonspecific reversal of the expected cervical lordosis. No acute fracture or aggressive osseous lesion. Other neck: No neck mass or cervical lymphadenopathy Upper chest: No consolidation within the imaged lung apices. Right apical bleb/bulla measuring 1.9 cm. Review of the MIP images confirms the above findings CTA HEAD FINDINGS Anterior circulation: The intracranial internal carotid arteries are patent. The M1 middle cerebral arteries are patent. No M2 proximal branch occlusion or high-grade proximal stenosis. The anterior cerebral arteries are patent. No intracranial aneurysm is identified. Posterior  circulation: The intracranial vertebral arteries are patent. The right posteroinferior cerebellar artery is poorly delineated. This may be due to developmentally  small vessel size. However, high-grade stenosis or vessel occlusion cannot be excluded. The basilar artery is patent. The posterior cerebral arteries are patent. A small left posterior communicating artery is present. The right posterior communicating artery is diminutive or absent. Venous sinuses: Within the limitations of contrast timing, no convincing thrombus. Anatomic variants: As described. Review of the MIP images confirms the above findings CT head impression #1 and CTA head impression #1 were called by telephone at the time of interpretation on 01/26/2023 at 11:19 am to provider PHILLIP STAFFORD , who verbally acknowledged these results. IMPRESSION: CT head: 1. Apparent foci of abnormal hypodensity within the inferior cerebellar hemispheres bilaterally. Differential considerations include acute infarcts, cerebellitis, edema from other acute process and artifact. A brain MRI (with and without contrast is recommended for further evaluation. 2. Partially empty sella turcica. This finding can reflect incidental anatomic variation, or alternatively, it can be associated with idiopathic intracranial hypertension (pseudotumor cerebri). CTA neck: 1. The common carotid, internal carotid and vertebral arteries are patent within the neck without stenosis or significant atherosclerotic disease. 2. 1.9 cm bulla/bleb at the right lung apex. 3. Nonspecific reversal of the expected cervical lordosis. CTA head: 1. The right PICA is poorly delineated. This may be due to developmentally small vessel size. However, high-grade stenosis or vessel occlusion cannot be excluded. 2. Elsewhere, no intracranial large vessel occlusion or proximal high-grade arterial stenosis. Electronically Signed   By: Jackey Loge D.O.   On: 01/26/2023 11:21   DG Chest 2 View  Result  Date: 12/31/2022 CLINICAL DATA:  Chest pain and shortness of breath EXAM: CHEST - 2 VIEW COMPARISON:  Radiograph 09/14/2022 FINDINGS: Stable cardiomediastinal silhouette. Aortic atherosclerotic calcification. No focal consolidation, pleural effusion, or pneumothorax. No displaced rib fractures. IMPRESSION: No acute cardiopulmonary disease. Electronically Signed   By: Minerva Fester M.D.   On: 12/31/2022 19:41    Microbiology: Results for orders placed or performed during the hospital encounter of 01/26/23  SARS Coronavirus 2 by RT PCR (hospital order, performed in [facility] hospital lab) *cepheid single result test* Anterior Nasal Swab     Status: None   Collection Time: 01/26/23 10:45 AM   Specimen: Anterior Nasal Swab  Result Value Ref Range Status   SARS Coronavirus 2 by RT PCR NEGATIVE NEGATIVE Final    Comment: (NOTE) SARS-CoV-2 target nucleic acids are NOT DETECTED.  The SARS-CoV-2 RNA is generally detectable in upper and lower respiratory specimens during the acute phase of infection. The lowest concentration of SARS-CoV-2 viral copies this assay can detect is 250 copies / mL. A negative result does not preclude SARS-CoV-2 infection and should not be used as the sole basis for treatment or other patient management decisions.  A negative result may occur with improper specimen collection / handling, submission of specimen other than nasopharyngeal swab, presence of viral mutation(s) within the areas targeted by this assay, and inadequate number of viral copies (<250 copies / mL). A negative result must be combined with clinical observations, patient history, and epidemiological information.  Fact Sheet for Patients:   RoadLapTop.co.za  Fact Sheet for Healthcare Providers: http://kim-miller.com/  This test is not yet approved or  cleared by the [place] FDA and has been authorized for detection and/or diagnosis of SARS-CoV-2  by FDA under an Emergency Use Authorization (EUA).  This EUA will remain in effect (meaning this test can be used) for the duration of the COVID-19 declaration under Section 564(b)(1) of the Act, 21 U.S.C. section 360bbb-3(b)(1), unless the authorization  is terminated or revoked sooner.  Performed at [facility], [address] [place], [place] [address]   CSF culture w Gram Stain     Status: None (Preliminary result)   Collection Time: 01/26/23  5:10 PM   Specimen: CSF; Cerebrospinal Fluid  Result Value Ref Range Status   Specimen Description   Final    CSF Performed at [facility], [address] [place], [place] [address]    Special Requests   Final    LP Performed at [facility], [address] [place], [place] [address]    Gram Stain   Final    WBC SEEN RED BLOOD CELLS PRESENT NO ORGANISMS SEEN Performed at [facility], [address], [place], [place] [address]    Culture   Final    NO GROWTH 2 DAYS Performed at [facility], [address] [place], [place] [address]    Report Status PENDING  Incomplete  Culture, fungus without smear     Status: None (Preliminary result)   Collection Time: 01/26/23  5:10 PM   Specimen: CSF; Cerebrospinal Fluid  Result Value Ref Range Status   Specimen Description   Final    CSF Performed at [facility], [address] [place], [place] [address]    Special Requests   Final    LP Performed at [facility], [address] [place], [place] [address]    Culture   Final    NO FUNGUS ISOLATED AFTER 2 DAYS Performed at [facility], [address] [place], [place] [address]    Report Status PENDING  Incomplete  Culture, blood (Routine X 2)     Status: None (Preliminary result)   Collection Time: 01/26/23  5:45 PM   Specimen: BLOOD  Result Value Ref Range Status   Specimen Description BLOOD BLOOD RIGHT ARM  Final   Special Requests   Final     BOTTLES DRAWN AEROBIC AND ANAEROBIC Blood Culture adequate volume   Culture   Final    NO GROWTH 3 DAYS Performed at [facility], [address] [place], [place] [address]    Report Status PENDING  Incomplete  Culture, blood (Routine X 2)     Status: None (Preliminary result)   Collection Time: 01/26/23  8:36 PM   Specimen: BLOOD  Result Value Ref Range Status   Specimen Description BLOOD BLOOD LEFT ARM  Final   Special Requests   Final    BOTTLES DRAWN AEROBIC AND ANAEROBIC Blood Culture adequate volume   Culture   Final    NO GROWTH 3 DAYS Performed at [facility], [address] [place], [place] [address]    Report Status PENDING  Incomplete    Labs: CBC: Recent Labs  Lab 01/22/23 1232 01/26/23 0826 01/27/23 0510  WBC 7.0 6.7 5.5  NEUTROABS  --  4.0  --   HGB 13.4 13.1 12.0  HCT 38.8 37.9 34.5*  MCV 94.6 93.1 93.0  PLT 230 248 230   Basic Metabolic Panel: Recent Labs  Lab 01/22/23 1232 01/26/23 0826 01/27/23 0505 01/27/23 0510 01/28/23 0532  NA 139 140  --  136 139  K 3.7 4.4  --  3.4* 3.6  CL 106 110  --  108 114*  CO2 24 23  --  23 20*  GLUCOSE 93 93  --  94 90  BUN 14 14  --  11 9  CREATININE 0.81 0.91  --  0.69 0.76  CALCIUM 8.9 8.9  --  8.0* 8.0*  MG  --   --  1.9  --  2.0   Liver Function Tests: Recent Labs  Lab 01/26/23 0826 01/27/23 0510  AST 20 10*  ALT 22 16  ALKPHOS 50 45  BILITOT 0.9 0.4  PROT 6.3* 5.3*  ALBUMIN 3.5 2.9*   CBG: No results for input(s): "GLUCAP" in the last 168 hours.  Discharge time spent: greater than 30 minutes.  Signed: Lurene Shadow, MD [facility] 01/29/2023

## 2023-01-30 LAB — MISC LABCORP TEST (SEND OUT): Labcorp test code: 505310

## 2023-01-30 LAB — IGG CSF INDEX
Albumin CSF-mCnc: 87 mg/dL — ABNORMAL HIGH (ref 8–37)
Albumin: 3.5 g/dL — ABNORMAL LOW (ref 3.9–4.9)
CSF IgG Index: 0.6 (ref 0.0–0.7)
IgG (Immunoglobin G), Serum: 719 mg/dL (ref 586–1602)
IgG, CSF: 11 mg/dL — ABNORMAL HIGH (ref 0.0–6.7)
IgG/Alb Ratio, CSF: 0.13 (ref 0.00–0.25)

## 2023-01-30 LAB — CSF CULTURE W GRAM STAIN: Culture: NO GROWTH

## 2023-01-30 LAB — ANCA PROFILE
Anti-MPO Antibodies: <0.2 U (ref 0.0–0.9)
Anti-PR3 Antibodies: <0.2 U (ref 0.0–0.9)
Atypical P-ANCA titer: <1:20 {titer}
C-ANCA: <1:20 {titer}
P-ANCA: <1:20 {titer}

## 2023-01-31 LAB — CULTURE, BLOOD (ROUTINE X 2)
Culture: NO GROWTH
Culture: NO GROWTH
Special Requests: ADEQUATE
Special Requests: ADEQUATE

## 2023-01-31 LAB — QUANTIFERON-TB GOLD PLUS (RQFGPL)
QuantiFERON Mitogen Value: >10 [IU]/mL
QuantiFERON Nil Value: 0.09 [IU]/mL
QuantiFERON TB1 Ag Value: 0.09 [IU]/mL
QuantiFERON TB2 Ag Value: 0.09 [IU]/mL

## 2023-01-31 LAB — QUANTIFERON-TB GOLD PLUS: QuantiFERON-TB Gold Plus: NEGATIVE

## 2023-02-01 LAB — OLIGOCLONAL BANDS, CSF + SERM

## 2023-02-01 LAB — CRYOGLOBULIN

## 2023-02-16 LAB — CULTURE, FUNGUS WITHOUT SMEAR

## 2023-02-17 ENCOUNTER — Ambulatory Visit
Admission: RE | Admit: 2023-02-17 | Discharge: 2023-02-17 | Disposition: A | Discharge status: Home / Self Care | Payer: 59 | Source: Ambulatory Visit | Attending: Family Medicine | Admitting: Family Medicine

## 2023-02-17 ENCOUNTER — Other Ambulatory Visit: Payer: Self-pay | Admitting: Family Medicine

## 2023-02-17 DIAGNOSIS — M545 Low back pain, unspecified: Secondary | ICD-10-CM

## 2023-02-20 ENCOUNTER — Other Ambulatory Visit: Payer: Self-pay | Admitting: Family Medicine

## 2023-02-20 DIAGNOSIS — M545 Low back pain, unspecified: Secondary | ICD-10-CM

## 2023-03-27 ENCOUNTER — Other Ambulatory Visit: Payer: Self-pay

## 2023-03-27 DIAGNOSIS — R519 Headache, unspecified: Secondary | ICD-10-CM | POA: Diagnosis present

## 2023-03-27 NOTE — ED Triage Notes (Signed)
Pt to ED via POV c/o headache. Pt has been having headaches since she had meningitis in September but says it got worse yesterday. Denies fevers at home

## 2023-03-28 ENCOUNTER — Emergency Department
Admission: EM | Admit: 2023-03-28 | Discharge: 2023-03-28 | Disposition: A | Discharge status: Home / Self Care | Payer: 59 | Attending: Emergency Medicine | Admitting: Emergency Medicine

## 2023-03-28 DIAGNOSIS — R519 Headache, unspecified: Secondary | ICD-10-CM

## 2023-03-28 MED ORDER — DEXAMETHASONE SODIUM PHOSPHATE 10 MG/ML IJ SOLN
10.0000 mg | Freq: Once | INTRAMUSCULAR | Status: AC
  Administered 2023-03-28: 10 mg via INTRAVENOUS
  Filled 2023-03-28: qty 1

## 2023-03-28 MED ORDER — SODIUM CHLORIDE 0.9 % IV BOLUS
500.0000 mL | Freq: Once | INTRAVENOUS | Status: AC
  Administered 2023-03-28: 500 mL via INTRAVENOUS

## 2023-03-28 MED ORDER — DROPERIDOL 2.5 MG/ML IJ SOLN
2.5000 mg | Freq: Once | INTRAMUSCULAR | Status: AC
  Administered 2023-03-28: 2.5 mg via INTRAVENOUS
  Filled 2023-03-28: qty 2

## 2023-03-28 MED ORDER — DIPHENHYDRAMINE HCL 50 MG/ML IJ SOLN
12.5000 mg | INTRAMUSCULAR | Status: AC
  Administered 2023-03-28: 12.5 mg via INTRAVENOUS
  Filled 2023-03-28: qty 1

## 2023-03-28 MED ORDER — KETOROLAC TROMETHAMINE 30 MG/ML IJ SOLN
15.0000 mg | Freq: Once | INTRAMUSCULAR | Status: AC
  Administered 2023-03-28: 15 mg via INTRAVENOUS
  Filled 2023-03-28: qty 1

## 2023-03-28 NOTE — Discharge Instructions (Signed)
You have been seen in the Emergency Department (ED) for a headache.  Please use Tylenol or Motrin as needed for symptoms, but only as written on the box.   Continue taking the medications prescribed by Dr. Malvin Johns.  As we have discussed, please follow up with your neurologist as soon as possible regarding today's Emergency Department (ED) visit and your headache symptoms.    Call your doctor or return to the ED if you have a worsening headache, sudden and severe headache, confusion, slurred speech, facial droop, weakness or numbness in any arm or leg, extreme fatigue, vision problems, or other symptoms that concern you.

## 2023-03-28 NOTE — ED Provider Notes (Signed)
[facility] Provider Note    Event Date/Time   First MD Initiated Contact with Patient 03/28/23 0040     (approximate)   History   Headache   HPI Jacqueline Cooke is a 42 y.o. female who presents for evaluation of headache rating down her neck.  She has been having headaches for a long time, but notes that in September she was diagnosed with viral meningitis.  Since that time she has had headache and neck pain and stiffness consistently.  However, she came in tonight because it has been worse over the last day or so.  She saw Dr. Malvin Johns with neurology approximately a week ago for the ongoing headache and neck pain and stiffness and he is changing around her regimen of Topamax and gabapentin to try to find a good therapeutic regimen.  She also takes a triptan which she said has not been helping.  She reports a history of migraines.  She said that she is sensitive to light.  She is able to flex and extend and rotate her head and neck but it hurts to do so but that is always the case.  No focal weakness in her arms or her legs.  No recent trauma.  No recent fevers.     Physical Exam   Triage Vital Signs: ED Triage Vitals  Encounter Vitals Group     BP 03/27/23 2231 (!) 116/92     Systolic BP Percentile --      Diastolic BP Percentile --      Pulse Rate 03/27/23 2231 70     Resp 03/27/23 2231 16     Temp 03/27/23 2231 98.2 F (36.8 C)     Temp src --      SpO2 03/27/23 2231 100 %     Weight 03/27/23 2234 79.8 kg (176 lb)     Height 03/27/23 2234 1.727 m (5\' 8" )     Head Circumference --      Peak Flow --      Pain Score 03/27/23 2234 10     Pain Loc --      Pain Education --      Exclude from Growth Chart --     Most recent vital signs: Vitals:   03/27/23 2231  BP: (!) 116/92  Pulse: 70  Resp: 16  Temp: 98.2 F (36.8 C)  SpO2: 100%    General: Awake, appears uncomfortable but without obvious distress. Eyes:  Patient is exhibiting  signs of photophobia but pupils are equal and reactive and extraocular motion is normal. CV:  Good peripheral perfusion.  Resp:  Normal effort. Speaking easily and comfortably, no accessory muscle usage nor intercostal retractions.   Abd:  No distention.    ED Results / Procedures / Treatments   Labs (all labs ordered are listed, but only abnormal results are displayed) Labs Reviewed - No data to display    PROCEDURES:  Critical Care performed: No  Procedures    IMPRESSION / MDM / ASSESSMENT AND PLAN / ED COURSE  I reviewed the triage vital signs and the nursing notes.                              Differential diagnosis includes, but is not limited to, migraine, acute on chronic nonspecific headache, meningitis, a idiopathic intracranial hypertension.  Patient's presentation is most consistent with acute presentation with potential threat to life or bodily  function.   Interventions/Medications given:  Medications  droperidol (INAPSINE) 2.5 MG/ML injection 2.5 mg (2.5 mg Intravenous Given 03/28/23 0130)  diphenhydrAMINE (BENADRYL) injection 12.5 mg (12.5 mg Intravenous Given 03/28/23 0131)  ketorolac (TORADOL) 30 MG/ML injection 15 mg (15 mg Intravenous Given 03/28/23 0131)  sodium chloride 0.9 % bolus 500 mL (500 mLs Intravenous New Bag/Given 03/28/23 0131)  dexamethasone (DECADRON) injection 10 mg (10 mg Intravenous Given 03/28/23 0130)    (Note:  hospital course my include additional interventions and/or labs/studies not listed above.)   Patient's vital signs are stable and within normal limits including no tachycardia and no fever.  I reviewed the medical record including her hospitalization and discharge in September 2023 after treatment for viral meningitis.  I also read the clinic note written by Dr. Malvin Johns on 03/21/2023 where he discussed medication management for her ongoing headache, neck pain, and neck stiffness.  There is no indication she would benefit from  another lumbar puncture and the probability of her developing a new meningitis, particularly a life-threatening bacterial meningitis, is exceedingly low.  She seems to be having persistent headache and neck pain which is very frustrating to her.  We discussed how this is an ongoing issue that will require neurology intervention and treatment, but we may be able to help in the short-term by treating as a migraine.  She agreed with the plan so ordered medications as listed above and will reassess.  No indication for emergent imaging at this time.     Clinical Course as of 03/28/23 0255  Tue Mar 28, 2023  [id number] Patient has been sleeping comfortably.  I woke her up and she said she felt much better and her headache is gone.  She is ready to go home and I think that is reasonable and appropriate.  She will follow-up with Dr. Malvin Johns.  I gave my usual and customary return precautions. [CF]    Clinical Course User Index [CF] Loleta Rose, MD     FINAL CLINICAL IMPRESSION(S) / ED DIAGNOSES   Final diagnoses:  Nonintractable headache, unspecified chronicity pattern, unspecified headache type     Rx / DC Orders   ED Discharge Orders     None        Note:  This document was prepared using Dragon voice recognition software and may include unintentional dictation errors.   Loleta Rose, MD 03/28/23 [phone]

## 2023-06-08 ENCOUNTER — Emergency Department: Payer: 59

## 2023-06-08 ENCOUNTER — Encounter: Payer: Self-pay | Admitting: Intensive Care

## 2023-06-08 ENCOUNTER — Emergency Department
Admission: EM | Admit: 2023-06-08 | Discharge: 2023-06-08 | Disposition: A | Discharge status: Home / Self Care | Payer: 59 | Attending: Emergency Medicine | Admitting: Emergency Medicine

## 2023-06-08 ENCOUNTER — Other Ambulatory Visit: Payer: Self-pay

## 2023-06-08 DIAGNOSIS — R519 Headache, unspecified: Secondary | ICD-10-CM | POA: Insufficient documentation

## 2023-06-08 DIAGNOSIS — R0981 Nasal congestion: Secondary | ICD-10-CM | POA: Diagnosis not present

## 2023-06-08 DIAGNOSIS — R059 Cough, unspecified: Secondary | ICD-10-CM | POA: Insufficient documentation

## 2023-06-08 DIAGNOSIS — R0789 Other chest pain: Secondary | ICD-10-CM | POA: Diagnosis not present

## 2023-06-08 DIAGNOSIS — J45909 Unspecified asthma, uncomplicated: Secondary | ICD-10-CM | POA: Diagnosis not present

## 2023-06-08 LAB — BASIC METABOLIC PANEL
Anion gap: 10 (ref 5–15)
BUN: 13 mg/dL (ref 6–20)
CO2: 22 mmol/L (ref 22–32)
Calcium: 9.3 mg/dL (ref 8.9–10.3)
Chloride: 107 mmol/L (ref 98–111)
Creatinine, Ser: 0.78 mg/dL (ref 0.44–1.00)
GFR, Estimated: >60 mL/min (ref >60)
Glucose, Bld: 90 mg/dL (ref 70–99)
Potassium: 3.9 mmol/L (ref 3.5–5.1)
Sodium: 139 mmol/L (ref 135–145)

## 2023-06-08 LAB — D-DIMER, QUANTITATIVE: D-Dimer, Quant: 0.43 ug{FEU}/mL (ref 0.00–0.50)

## 2023-06-08 LAB — CBC
HCT: 41.5 % (ref 36.0–46.0)
Hemoglobin: 14.2 g/dL (ref 12.0–15.0)
MCH: 31.9 pg (ref 26.0–34.0)
MCHC: 34.2 g/dL (ref 30.0–36.0)
MCV: 93.3 fL (ref 80.0–100.0)
Platelets: 232 10*3/uL (ref 150–400)
RBC: 4.45 MIL/uL (ref 3.87–5.11)
RDW: 11.9 % (ref 11.5–15.5)
WBC: 6.2 10*3/uL (ref 4.0–10.5)
nRBC: 0 % (ref 0.0–0.2)

## 2023-06-08 LAB — TROPONIN I (HIGH SENSITIVITY)
Troponin I (High Sensitivity): <2 ng/L (ref <18)
Troponin I (High Sensitivity): 2 ng/L (ref <18)

## 2023-06-08 MED ORDER — PREDNISONE 20 MG PO TABS: 40.0000 mg | ORAL_TABLET | Freq: Every day | ORAL | 0 refills | Status: AC

## 2023-06-08 MED ORDER — IPRATROPIUM-ALBUTEROL 0.5-2.5 (3) MG/3ML IN SOLN
6.0000 mL | Freq: Once | RESPIRATORY_TRACT | Status: AC
  Administered 2023-06-08: 6 mL via RESPIRATORY_TRACT
  Filled 2023-06-08: qty 3

## 2023-06-08 MED ORDER — KETOROLAC TROMETHAMINE 15 MG/ML IJ SOLN
15.0000 mg | Freq: Once | INTRAMUSCULAR | Status: AC
  Administered 2023-06-08: 15 mg via INTRAVENOUS
  Filled 2023-06-08: qty 1

## 2023-06-08 MED ORDER — SODIUM CHLORIDE 0.9 % IV BOLUS: 1000.0000 mL | Freq: Once | INTRAVENOUS | Status: DC

## 2023-06-08 MED ORDER — PROCHLORPERAZINE EDISYLATE 10 MG/2ML IJ SOLN
10.0000 mg | Freq: Once | INTRAMUSCULAR | Status: AC
  Administered 2023-06-08: 10 mg via INTRAVENOUS
  Filled 2023-06-08: qty 2

## 2023-06-08 MED ORDER — METHYLPREDNISOLONE SODIUM SUCC 125 MG IJ SOLR
125.0000 mg | Freq: Once | INTRAMUSCULAR | Status: AC
  Administered 2023-06-08: 125 mg via INTRAVENOUS
  Filled 2023-06-08: qty 2

## 2023-06-08 MED ORDER — DIPHENHYDRAMINE HCL 50 MG/ML IJ SOLN
25.0000 mg | Freq: Once | INTRAMUSCULAR | Status: AC
  Administered 2023-06-08: 25 mg via INTRAVENOUS
  Filled 2023-06-08: qty 1

## 2023-06-08 NOTE — ED Triage Notes (Signed)
 Patient c/o right sided chest tightness. Reports pain is worse with breathing. Headaches since Sunday.

## 2023-06-08 NOTE — ED Provider Notes (Signed)
 [facility] Provider Note    Event Date/Time   First MD Initiated Contact with Patient 06/08/23 1202     (approximate)   History   Chest Pain   HPI  Jacqueline Cooke is a 43 year old female with history of migraines, asthma presenting to the emergency department for evaluation of headache and right sided chest discomfort.  Over the past Avril days, patient has had cough and congestion.  More recently has had pain over her right chest wall.  No history of trauma.  She additionally reports onset of a headache a few days ago, similar to prior to character.  No neck pain, numbness comfortably, focal weakness.  Feels similar to prior headaches, does report that she has had to have migraine cocktails in the ER previously which were effective.    Physical Exam   Triage Vital Signs: ED Triage Vitals  Encounter Vitals Group     BP 06/08/23 1045 (!) 144/95     Systolic BP Percentile --      Diastolic BP Percentile --      Pulse Rate 06/08/23 1045 67     Resp 06/08/23 1045 20     Temp 06/08/23 1045 98.5 F (36.9 C)     Temp Source 06/08/23 1045 Oral     SpO2 06/08/23 1045 100 %     Weight 06/08/23 1042 185 lb (83.9 kg)     Height 06/08/23 1042 5' 8 (1.727 m)     Head Circumference --      Peak Flow --      Pain Score 06/08/23 1042 10     Pain Loc --      Pain Education --      Exclude from Growth Chart --     Most recent vital signs: Vitals:   06/08/23 1430 06/08/23 1501  BP: 115/83   Pulse: 66   Resp: 20   Temp:  98.5 F (36.9 C)  SpO2: 100%      General: Awake, interactive  CV:  Regular rate, good peripheral perfusion.  Resp:  Unlabored respirations, lungs somewhat coarse bilaterally with occasional faint expiratory wheeze Abd:  Nondistended, soft, nontender to outpatient Neuro:  Symmetric facial movement, fluid speech, 5 of 5 strength of the bilateral upper and lower extremities with normal sensation, pupils equal and reactive,  normal extraocular movements   ED Results / Procedures / Treatments   Labs (all labs ordered are listed, but only abnormal results are displayed) Labs Reviewed  BASIC METABOLIC PANEL  CBC  D-DIMER, QUANTITATIVE  POC URINE PREG, ED  TROPONIN I (HIGH SENSITIVITY)  TROPONIN I (HIGH SENSITIVITY)     EKG EKG independently reviewed interpreted by myself (ER attending) demonstrates:  EKG demonstrates normal sinus rhythm rate of 68, PR 124, QRS 82, QTc 423, no acute ST changes  RADIOLOGY Imaging independently reviewed and interpreted by myself demonstrates:  CXR without focal consolidation  PROCEDURES:  Critical Care performed: No  Procedures   MEDICATIONS ORDERED IN ED: Medications  sodium chloride  0.9 % bolus 1,000 mL (has no administration in time range)  ipratropium-albuterol  (DUONEB) 0.5-2.5 (3) MG/3ML nebulizer solution 6 mL (6 mLs Nebulization Given 06/08/23 1343)  methylPREDNISolone  sodium succinate (SOLU-MEDROL ) 125 mg/2 mL injection 125 mg (125 mg Intravenous Given 06/08/23 1342)  ketorolac  (TORADOL ) 15 MG/ML injection 15 mg (15 mg Intravenous Given 06/08/23 1342)  diphenhydrAMINE  (BENADRYL ) injection 25 mg (25 mg Intravenous Given 06/08/23 1343)  prochlorperazine  (COMPAZINE ) injection 10 mg (10 mg Intravenous  Given 06/08/23 1343)     IMPRESSION / MDM / ASSESSMENT AND PLAN / ED COURSE  I reviewed the triage vital signs and the nursing notes.  Differential diagnosis includes, but is not limited to, benign headache, no fevers, focal neurologic deficits, neck stiffness, change in character of headache suggestive of emergent process, clinical history seems most consistent with viral illness, possibly with associated musculoskeletal pain, differential includes ACS, PE  Patient's presentation is most consistent with acute presentation with potential threat to life or bodily function.  43 year old female presenting with right-sided chest pain and headache.  Stable vitals.  Labs  reassuring including negative D-dimer and troponin with greater than 3 hours of symptoms.  X-Brynnly Bonet without pneumonia.  EKG without acute ischemic findings.  Patient treated symptomatically with steroids and a DuoNeb for her breathing and Toradol , Benadryl , Compazine  for headache.  On reevaluation, patient feels much improved.  Her chest pain is completely resolved.  She is comfortable discharge home.  Suspect musculoskeletal pain in the setting of respiratory illness with asthma flare.  Will DC with short course of steroids.  Strict return precautions provided.  Patient discharged stable condition.      FINAL CLINICAL IMPRESSION(S) / ED DIAGNOSES   Final diagnoses:  Acute nonintractable headache, unspecified headache type  Right-sided chest wall pain     Rx / DC Orders   ED Discharge Orders          Ordered    predniSONE  (DELTASONE ) 20 MG tablet  Daily with breakfast        06/08/23 1504             Note:  This document was prepared using Dragon voice recognition software and may include unintentional dictation errors.   Levander Slate, MD 06/08/23 [phone]

## 2023-06-08 NOTE — Discharge Instructions (Addendum)
 You were seen in the ER for chest wall pain and headache.  I suspect your symptoms are related to a viral illness that may have caused an asthma flare.  I sent a short course of steroids to your pharmacy.  You can continue to use your inhaler as needed.  Return to the ER for new or worsening symptoms.

## 2023-06-12 ENCOUNTER — Inpatient Hospital Stay: Admission: RE | Admit: 2023-06-12 | Payer: 59 | Source: Ambulatory Visit

## 2023-08-15 ENCOUNTER — Emergency Department
Admission: EM | Admit: 2023-08-15 | Discharge: 2023-08-15 | Disposition: A | Discharge status: Home / Self Care | Attending: Emergency Medicine | Admitting: Emergency Medicine

## 2023-08-15 ENCOUNTER — Encounter: Payer: Self-pay | Admitting: Emergency Medicine

## 2023-08-15 ENCOUNTER — Other Ambulatory Visit: Payer: Self-pay

## 2023-08-15 DIAGNOSIS — R109 Unspecified abdominal pain: Secondary | ICD-10-CM | POA: Diagnosis present

## 2023-08-15 DIAGNOSIS — K5900 Constipation, unspecified: Secondary | ICD-10-CM | POA: Insufficient documentation

## 2023-08-15 DIAGNOSIS — I10 Essential (primary) hypertension: Secondary | ICD-10-CM | POA: Insufficient documentation

## 2023-08-15 DIAGNOSIS — J449 Chronic obstructive pulmonary disease, unspecified: Secondary | ICD-10-CM | POA: Insufficient documentation

## 2023-08-15 LAB — POC URINE PREG, ED: Preg Test, Ur: NEGATIVE

## 2023-08-15 MED ORDER — MINERAL OIL RE ENEM
3.0000 | ENEMA | Freq: Once | RECTAL | 0 refills | Status: AC
Start: 2023-08-15 — End: 2023-08-15

## 2023-08-15 MED ORDER — MAGNESIUM CITRATE PO SOLN: 1.0000 | Freq: Every day | ORAL | 0 refills | Status: DC

## 2023-08-15 MED ORDER — FLEET ENEMA RE ENEM: 1.0000 | ENEMA | Freq: Every day | RECTAL | 0 refills | Status: DC | PRN

## 2023-08-15 MED ORDER — MAGNESIUM CITRATE PO SOLN: 1.0000 | Freq: Every day | ORAL | 0 refills | Status: AC

## 2023-08-15 MED ORDER — FLEET ENEMA RE ENEM: 1.0000 | ENEMA | Freq: Every day | RECTAL | 0 refills | Status: AC | PRN

## 2023-08-15 MED ORDER — POLYETHYLENE GLYCOL 3350 17 G PO PACK: 17.0000 g | PACK | Freq: Two times a day (BID) | ORAL | 0 refills | Status: AC

## 2023-08-15 MED ORDER — METAMUCIL SMOOTH TEXTURE 58.6 % PO POWD: 1.0000 | Freq: Every day | ORAL | 0 refills | Status: AC

## 2023-08-15 MED ORDER — MINERAL OIL RE ENEM: 1.0000 | ENEMA | Freq: Once | RECTAL | 0 refills | Status: DC

## 2023-08-15 MED ORDER — POLYETHYLENE GLYCOL 3350 17 G PO PACK: 17.0000 g | PACK | Freq: Two times a day (BID) | ORAL | 0 refills | Status: DC

## 2023-08-15 MED ORDER — METAMUCIL SMOOTH TEXTURE 58.6 % PO POWD: 1.0000 | Freq: Every day | ORAL | 0 refills | Status: DC

## 2023-08-15 MED ORDER — FLEET ENEMA RE ENEM
1.0000 | ENEMA | Freq: Once | RECTAL | Status: AC
  Administered 2023-08-15: 1 via RECTAL

## 2023-08-15 NOTE — ED Triage Notes (Signed)
 Patient ambulatory to triage with steady gait, without difficulty or distress noted; pt reports having constipation x 6days with no relief accomp by lower abd pain

## 2023-08-15 NOTE — ED Provider Notes (Signed)
 [facility] Provider Note    Event Date/Time   First MD Initiated Contact with Patient 08/15/23 2302     (approximate)   History   Constipation   HPI  Jacqueline Cooke is a 43 y.o. female   Past medical history of substance use but has been substance use free for now 15 months, hypertension, COPD, who presents to the Emergency Department with constipation.  She has had no bowel movement for 6 days.  Just prior she had had hard stools and needed to take prune juice to induce stooling.  She has had C-sections but no other abdominal surgeries or pelvic surgeries in the past.  She has been passing flatus.  She has intermittent crampy pain in her abdomen but no localized pain.  She has had no changes in her medications recently.  Independent Historian contributed to assessment above: Husband at bedside corroborates information past medical history above    Physical Exam   Triage Vital Signs: ED Triage Vitals  Encounter Vitals Group     BP 08/15/23 2251 (!) 124/99     Systolic BP Percentile --      Diastolic BP Percentile --      Pulse Rate 08/15/23 2251 70     Resp 08/15/23 2251 18     Temp 08/15/23 2251 97.7 F (36.5 C)     Temp Source 08/15/23 2251 Oral     SpO2 08/15/23 2251 100 %     Weight 08/15/23 2248 185 lb (83.9 kg)     Height 08/15/23 2248 5\' 9"  (1.753 m)     Head Circumference --      Peak Flow --      Pain Score 08/15/23 2247 9     Pain Loc --      Pain Education --      Exclude from Growth Chart --     Most recent vital signs: Vitals:   08/15/23 2251  BP: (!) 124/99  Pulse: 70  Resp: 18  Temp: 97.7 F (36.5 C)  SpO2: 100%    General: Awake, no distress.  CV:  Good peripheral perfusion.  Resp:  Normal effort.  Abd:  No distention.  Other:  She has a soft nondistended nontender abdomen to deep palpation all quadrants without rigidity or guarding.  Her hemodynamics are appropriate and reassuring she has no  fever.   ED Results / Procedures / Treatments   Labs (all labs ordered are listed, but only abnormal results are displayed) Labs Reviewed  POC URINE PREG, ED     I ordered and reviewed the above labs they are notable for negative pregnancy test  PROCEDURES:  Critical Care performed: No  Procedures   MEDICATIONS ORDERED IN ED: Medications  sodium phosphate (FLEET) enema 1 enema (1 enema Rectal Given 08/15/23 2323)    IMPRESSION / MDM / ASSESSMENT AND PLAN / ED COURSE  I reviewed the triage vital signs and the nursing notes.                                Patient's presentation is most consistent with acute presentation with potential threat to life or bodily function.  Differential diagnosis includes, but is not limited to, constipation, obstruction, ruptured viscus, considered but less likely surgical abdominal pathology like appendicitis, diverticulitis  The patient is on the cardiac monitor to evaluate for evidence of arrhythmia and/or significant heart rate changes.  MDM:    The patient has constipation over 6 days and worsening abdominal discomfort crampy abdominal pain related.  I offered both digital rectal exam or potential fecal disimpaction here in the emergency department but she declined stating that she would like to try medical management first.  She has been trying an array of over-the-counter laxatives sporadically over the last several days without good effect.  I will prescribe her a multimodal bowel regiment to try at home.  She wants to take a Fleet enema home with her tonight to try during the nighttime.  I will also prescribe her medications to take over the next few days and have her follow-up with PMD.  I doubt surgical abdominal pathologies at this time given her benign abdominal exam.  I doubt complete obstruction given her inability to pass flatus.  I again offered digital rectal exam for disimpaction but she declines at this time opting for medical  management at home.  She understands to return to the emergency department with new or worsening symptoms.       FINAL CLINICAL IMPRESSION(S) / ED DIAGNOSES   Final diagnoses:  Constipation, unspecified constipation type     Rx / DC Orders   ED Discharge Orders          Ordered    mineral oil enema   Once,   Status:  Discontinued        08/15/23 2320    sodium phosphate (FLEET) ENEM  Daily PRN,   Status:  Discontinued        08/15/23 2320    polyethylene glycol (MIRALAX) 17 g packet  2 times daily,   Status:  Discontinued        08/15/23 2320    psyllium (METAMUCIL SMOOTH TEXTURE) 58.6 % powder  Daily,   Status:  Discontinued        08/15/23 2320    magnesium citrate SOLN  Daily,   Status:  Discontinued        08/15/23 2320    magnesium citrate SOLN  Daily        08/15/23 2327    polyethylene glycol (MIRALAX) 17 g packet  2 times daily        08/15/23 2327    psyllium (METAMUCIL SMOOTH TEXTURE) 58.6 % powder  Daily        08/15/23 2327    sodium phosphate (FLEET) ENEM  Daily PRN        08/15/23 2327    mineral oil enema   Once        08/15/23 2327             Note:  This document was prepared using Dragon voice recognition software and may include unintentional dictation errors.    Buell Carmin, MD 08/15/23 [phone]

## 2023-08-15 NOTE — Discharge Instructions (Addendum)
 Over the next 2 to 3 days: Take MiraLAX 1 capful 4 times daily. Take 1 bottle of magnesium citrate daily. Take an enema (either Fleet enema or mineral oil enema) daily.  If you fail to have a bowel movement or should your symptoms become severe, you should call your primary doctor right away or come back to the emergency department.  If you do have a bowel movement, continue to take 2 capfuls of MiraLAX daily as well as 1 capful of Metamucil daily to maintain regular bowel movements moving forward.   Thank you for choosing us  for your health care today!  Please see your primary doctor this week for a follow up appointment.   If you have any new, worsening, or unexpected symptoms call your doctor right away or come back to the emergency department for reevaluation.  It was my pleasure to care for you today.   Arron Large Margery Sheets, MD

## 2023-09-14 ENCOUNTER — Emergency Department

## 2023-09-14 ENCOUNTER — Other Ambulatory Visit: Payer: Self-pay

## 2023-09-14 ENCOUNTER — Emergency Department
Admission: EM | Admit: 2023-09-14 | Discharge: 2023-09-14 | Disposition: A | Discharge status: Home / Self Care | Attending: Emergency Medicine | Admitting: Emergency Medicine

## 2023-09-14 DIAGNOSIS — S61301A Unspecified open wound of left index finger with damage to nail, initial encounter: Secondary | ICD-10-CM | POA: Diagnosis not present

## 2023-09-14 DIAGNOSIS — W108XXA Fall (on) (from) other stairs and steps, initial encounter: Secondary | ICD-10-CM | POA: Diagnosis not present

## 2023-09-14 DIAGNOSIS — S61309A Unspecified open wound of unspecified finger with damage to nail, initial encounter: Secondary | ICD-10-CM

## 2023-09-14 DIAGNOSIS — S6992XA Unspecified injury of left wrist, hand and finger(s), initial encounter: Secondary | ICD-10-CM | POA: Diagnosis present

## 2023-09-14 MED ORDER — LIDOCAINE HCL (PF) 1 % IJ SOLN
5.0000 mL | Freq: Once | INTRAMUSCULAR | Status: AC
  Administered 2023-09-14: 5 mL
  Filled 2023-09-14: qty 5

## 2023-09-14 NOTE — ED Provider Notes (Signed)
 [facility] Emergency Department Provider Note     Event Date/Time   First MD Initiated Contact with Patient 09/14/23 1640     (approximate)   History   Finger Injury   HPI  Jacqueline Cooke is a 43 y.o. female presents to the ED several days after she fell down the steps, causing a traumatic distal nail avulsion of her left index finger.  Patient is Nitrol nails have an acrylic overlay.  She has been unable to trim the nail back substantially due to underlying disruption of her nailbed.  She denies any other injury at this time.  She was seen at a local urgent care for the initial injury, but they did not attempt to trim or remove the distal nail defect.     Physical Exam   Triage Vital Signs: ED Triage Vitals  Encounter Vitals Group     BP 09/14/23 1535 (!) 120/97     Systolic BP Percentile --      Diastolic BP Percentile --      Pulse Rate 09/14/23 1535 76     Resp 09/14/23 1535 18     Temp 09/14/23 1535 98.1 F (36.7 C)     Temp src --      SpO2 09/14/23 1535 99 %     Weight 09/14/23 1534 180 lb (81.6 kg)     Height 09/14/23 1534 5\' 9"  (1.753 m)     Head Circumference --      Peak Flow --      Pain Score 09/14/23 1533 9     Pain Loc --      Pain Education --      Exclude from Growth Chart --     Most recent vital signs: Vitals:   09/14/23 1535  BP: (!) 120/97  Pulse: 76  Resp: 18  Temp: 98.1 F (36.7 C)  SpO2: 99%    General Awake, no distress. NAD HEENT NCAT. PERRL. EOMI. No rhinorrhea. Mucous membranes are moist.  CV:  Good peripheral perfusion. RRR RESP:  Normal effort. CTA MSK:  Left dense finger with a atraumatic distal fingernail avulsion across the distal nailbed.  No active bleeding noted at this time.  Normal composite fist on exam.   ED Results / Procedures / Treatments   Labs (all labs ordered are listed, but only abnormal results are displayed) Labs Reviewed - No data to  display   EKG    RADIOLOGY  I personally viewed and evaluated these images as part of my medical decision making, as well as reviewing the written report by the radiologist.  ED Provider Interpretation: No acute bony injury  DG Finger Index Left Result Date: 09/14/2023 CLINICAL DATA:  Pain after fall EXAM: LEFT INDEX FINGER 3V COMPARISON:  None Available. FINDINGS: No bony fracture. No dislocation. Preserved joint spaces. Preserved bone mineralization. There is a lucency involving the soft tissues of the extreme distal margin of the finger as well as deformity of the nail itself. Please correlate with clinical findings. IMPRESSION: No acute osseous abnormality. Deformity of the nail of the second digit and adjacent soft tissue. Electronically Signed   By: Adrianna Horde M.D.   On: 09/14/2023 15:58    PROCEDURES:  Critical Care performed: No  Nail Removal  Date/Time: 09/14/2023 7:40 PM  Performed by: May Sparks, PA-C Authorized by: May Sparks, PA-C   Consent:    Consent obtained:  Verbal   Consent given  by:  Patient   Risks, benefits, and alternatives were discussed: yes     Risks discussed:  Bleeding, pain and permanent nail deformity Universal protocol:    Imaging studies available: yes     Site/side marked: yes     Patient identity confirmed:  Verbally with patient Procedure details:    Location:  Hand   Hand location:  L index finger Anesthesia:    Anesthesia method:  Nerve block   Block location:  Transthecal block   Block needle gauge:  27 G   Block anesthetic:  Lidocaine  1% w/o epi   Block technique:  Flexor tendon sheath   Block injection procedure:  Anatomic landmarks identified, introduced needle, incremental injection and negative aspiration for blood   Block outcome:  Anesthesia achieved Nail Removal:    Nail removed:  Partial   Nail removed location: distal.   Nail bed repaired: no     Removed nail replaced and anchored: no    Post-procedure details:    Dressing:  4x4 sterile gauze and gauze roll   Procedure completion:  Tolerated well, no immediate complications    MEDICATIONS ORDERED IN ED: Medications  lidocaine  (PF) (XYLOCAINE ) 1 % injection 5 mL (5 mLs Infiltration Given 09/14/23 1650)     IMPRESSION / MDM / ASSESSMENT AND PLAN / ED COURSE  I reviewed the triage vital signs and the nursing notes.                              Differential diagnosis includes, but is not limited to, nail avulsion, finger laceration, finger dislocation, finger fracture  Patient's presentation is most consistent with acute complicated illness / injury requiring diagnostic workup.  Patient's diagnosis is consistent with partial nail avulsion traumatic.  Patient presents with a distal left index fingernail avulsion with acrylic overlay.  Patient consented to distal nail removal which was performed after adequate anesthesia was achieved.  Patient was discharged stable condition with a nonstick dressing in place.  Patient will be discharged home with wound care instructions.  Patient is to follow up with her primary provider as discussed, as needed or otherwise directed. Patient is given ED precautions to return to the ED for any worsening or new symptoms.     FINAL CLINICAL IMPRESSION(S) / ED DIAGNOSES   Final diagnoses:  Nail avulsion, finger, initial encounter     Rx / DC Orders   ED Discharge Orders     None        Note:  This document was prepared using Dragon voice recognition software and may include unintentional dictation errors.    May Sparks, PA-C 09/15/23 2346    Iver Marker, MD 09/16/23 0900

## 2023-09-14 NOTE — Discharge Instructions (Signed)
 Keep the wound clean, dry, and covered. Take OTC Tylenol  or Motrin  as needed for pain. Follow-up with your PCP as needed.

## 2023-09-14 NOTE — ED Triage Notes (Signed)
 Patient states she fell down stairs a few days ago, unable to bend left index finger.

## 2023-10-29 ENCOUNTER — Other Ambulatory Visit: Payer: Self-pay

## 2023-10-29 ENCOUNTER — Encounter: Payer: Self-pay | Admitting: Emergency Medicine

## 2023-10-29 ENCOUNTER — Emergency Department

## 2023-10-29 ENCOUNTER — Emergency Department
Admission: EM | Admit: 2023-10-29 | Discharge: 2023-10-29 | Disposition: A | Discharge status: Home / Self Care | Attending: Emergency Medicine | Admitting: Emergency Medicine

## 2023-10-29 DIAGNOSIS — J45901 Unspecified asthma with (acute) exacerbation: Secondary | ICD-10-CM | POA: Diagnosis not present

## 2023-10-29 DIAGNOSIS — E876 Hypokalemia: Secondary | ICD-10-CM | POA: Diagnosis not present

## 2023-10-29 DIAGNOSIS — R0789 Other chest pain: Secondary | ICD-10-CM

## 2023-10-29 LAB — BASIC METABOLIC PANEL WITH GFR
Anion gap: 9 (ref 5–15)
BUN: 12 mg/dL (ref 6–20)
CO2: 22 mmol/L (ref 22–32)
Calcium: 9.3 mg/dL (ref 8.9–10.3)
Chloride: 108 mmol/L (ref 98–111)
Creatinine, Ser: 0.84 mg/dL (ref 0.44–1.00)
GFR, Estimated: >60 mL/min (ref >60)
Glucose, Bld: 98 mg/dL (ref 70–99)
Potassium: 3 mmol/L — ABNORMAL LOW (ref 3.5–5.1)
Sodium: 139 mmol/L (ref 135–145)

## 2023-10-29 LAB — CBC
HCT: 39.7 % (ref 36.0–46.0)
Hemoglobin: 13.6 g/dL (ref 12.0–15.0)
MCH: 31.1 pg (ref 26.0–34.0)
MCHC: 34.3 g/dL (ref 30.0–36.0)
MCV: 90.8 fL (ref 80.0–100.0)
Platelets: 235 10*3/uL (ref 150–400)
RBC: 4.37 MIL/uL (ref 3.87–5.11)
RDW: 11.9 % (ref 11.5–15.5)
WBC: 7.3 10*3/uL (ref 4.0–10.5)
nRBC: 0 % (ref 0.0–0.2)

## 2023-10-29 LAB — TROPONIN I (HIGH SENSITIVITY): Troponin I (High Sensitivity): 3 ng/L (ref <18)

## 2023-10-29 MED ORDER — DEXAMETHASONE SODIUM PHOSPHATE 10 MG/ML IJ SOLN: 10.0000 mg | Freq: Once | INTRAMUSCULAR | Status: DC

## 2023-10-29 MED ORDER — IPRATROPIUM-ALBUTEROL 0.5-2.5 (3) MG/3ML IN SOLN
3.0000 mL | Freq: Once | RESPIRATORY_TRACT | Status: AC
  Administered 2023-10-29: 3 mL via RESPIRATORY_TRACT
  Filled 2023-10-29: qty 3

## 2023-10-29 MED ORDER — PREDNISONE 20 MG PO TABS
60.0000 mg | ORAL_TABLET | Freq: Once | ORAL | Status: AC
  Administered 2023-10-29: 60 mg via ORAL
  Filled 2023-10-29: qty 3

## 2023-10-29 MED ORDER — ALBUTEROL SULFATE HFA 108 (90 BASE) MCG/ACT IN AERS: 2.0000 | INHALATION_SPRAY | Freq: Four times a day (QID) | RESPIRATORY_TRACT | 1 refills | Status: DC | PRN

## 2023-10-29 MED ORDER — PREDNISONE 50 MG PO TABS: 50.0000 mg | ORAL_TABLET | Freq: Every day | ORAL | 0 refills | Status: AC

## 2023-10-29 MED ORDER — NAPROXEN 500 MG PO TABS: 500.0000 mg | ORAL_TABLET | Freq: Two times a day (BID) | ORAL | 2 refills | Status: DC

## 2023-10-29 MED ORDER — PREDNISONE 50 MG PO TABS: 50.0000 mg | ORAL_TABLET | Freq: Every day | ORAL | 0 refills | Status: DC

## 2023-10-29 MED ORDER — ALBUTEROL SULFATE HFA 108 (90 BASE) MCG/ACT IN AERS: 2.0000 | INHALATION_SPRAY | Freq: Four times a day (QID) | RESPIRATORY_TRACT | 1 refills | Status: AC | PRN

## 2023-10-29 MED ORDER — KETOROLAC TROMETHAMINE 30 MG/ML IJ SOLN
30.0000 mg | Freq: Once | INTRAMUSCULAR | Status: AC
  Administered 2023-10-29: 30 mg via INTRAMUSCULAR
  Filled 2023-10-29: qty 1

## 2023-10-29 NOTE — ED Provider Notes (Signed)
 [facility] Provider Note    Event Date/Time   First MD Initiated Contact with Patient 10/29/23 2201     (approximate)   History   Chest Pain   HPI  Jacqueline Cooke is a 43 y.o. female who presents with wheezing and right-sided chest discomfort.  Patient reports she has a history of asthma.  No fevers reported but intermittent cough     Physical Exam   Triage Vital Signs: ED Triage Vitals  Encounter Vitals Group     BP 10/29/23 2125 113/89     Girls Systolic BP Percentile --      Girls Diastolic BP Percentile --      Boys Systolic BP Percentile --      Boys Diastolic BP Percentile --      Pulse Rate 10/29/23 2125 85     Resp 10/29/23 2125 18     Temp 10/29/23 2125 98.5 F (36.9 C)     Temp Source 10/29/23 2125 Oral     SpO2 10/29/23 2125 100 %     Weight 10/29/23 2125 81.6 kg (180 lb)     Height 10/29/23 2125 1.702 m (5' 7)     Head Circumference --      Peak Flow --      Pain Score 10/29/23 2128 9     Pain Loc --      Pain Education --      Exclude from Growth Chart --     Most recent vital signs: Vitals:   10/29/23 2125 10/29/23 2200  BP: 113/89 (!) 119/93  Pulse: 85 79  Resp: 18 19  Temp: 98.5 F (36.9 C)   SpO2: 100% 99%     General: Awake, no distress.  CV:  Good peripheral perfusion.  Patient has tenderness to palpation of the right superior lateral chest wall Resp:  Wheezing on exam Abd:  No distention.  Other:  No calf pain or swelling Patient has old trach scar, she reports from pneumonia   ED Results / Procedures / Treatments   Labs (all labs ordered are listed, but only abnormal results are displayed) Labs Reviewed  BASIC METABOLIC PANEL WITH GFR - Abnormal; Notable for the following components:      Result Value   Potassium 3.0 (*)    All other components within normal limits  CBC  POC URINE PREG, ED  TROPONIN I (HIGH SENSITIVITY)     EKG  ED ECG REPORT I, Lamar Price, the attending  physician, personally viewed and interpreted this ECG.  Date: 10/29/2023  Rhythm: normal sinus rhythm QRS Axis: normal Intervals: normal ST/T Wave abnormalities: normal Narrative Interpretation: no evidence of acute ischemia    RADIOLOGY Chest x-ray viewed interpret by me, no pneumonia    PROCEDURES:  Critical Care performed:   Procedures   MEDICATIONS ORDERED IN ED: Medications  ipratropium-albuterol  (DUONEB) 0.5-2.5 (3) MG/3ML nebulizer solution 3 mL (3 mLs Nebulization Given 10/29/23 2241)  ipratropium-albuterol  (DUONEB) 0.5-2.5 (3) MG/3ML nebulizer solution 3 mL (3 mLs Nebulization Given 10/29/23 2241)  predniSONE  (DELTASONE ) tablet 60 mg (60 mg Oral Given 10/29/23 2239)  ketorolac  (TORADOL ) 30 MG/ML injection 30 mg (30 mg Intramuscular Given 10/29/23 2243)     IMPRESSION / MDM / ASSESSMENT AND PLAN / ED COURSE  I reviewed the triage vital signs and the nursing notes. Patient's presentation is most consistent with acute presentation with potential threat to life or bodily function.  Patient presents with chest pain as detailed  above, differential includes pneumonia, pleurisy, musculoskeletal pain, less likely ACS.  Not consistent with PE  EKG is unremarkable, high sensitive troponin is normal.  No tachycardia  Patient with significant wheezing I suspect musculoskeletal pain related to increased work of breathing/pleurisy.  Will treat with steroids, IM Toradol , DuoNebs and reevaluate  ----------------------------------------- 11:04 PM on 10/29/2023 ----------------------------------------- Patient's wheezing significantly improved, much better on exam.  She feels better and is ready for discharge, will prescribe steroids, NSAIDs for chest wall pain, strict turn precautions, outpatient follow-up recommended, she agrees to this plan.      FINAL CLINICAL IMPRESSION(S) / ED DIAGNOSES   Final diagnoses:  Chest wall pain  Moderate asthma with exacerbation, unspecified  whether persistent     Rx / DC Orders   ED Discharge Orders          Ordered    predniSONE  (DELTASONE ) 50 MG tablet  Daily with breakfast        10/29/23 2303    naproxen  (NAPROSYN ) 500 MG tablet  2 times daily with meals        10/29/23 2303    albuterol  (VENTOLIN  HFA) 108 (90 Base) MCG/ACT inhaler  Every 6 hours PRN        10/29/23 2303             Note:  This document was prepared using Dragon voice recognition software and may include unintentional dictation errors.   Arlander Charleston, MD 10/29/23 [phone]

## 2023-10-29 NOTE — ED Triage Notes (Signed)
 Pt arrived via POV with reports of R side CP that started yesterday states the pain goes up the R side of her neck and down R arm.   No distress noted on arrival.

## 2023-12-24 ENCOUNTER — Other Ambulatory Visit: Payer: Self-pay

## 2023-12-24 ENCOUNTER — Emergency Department

## 2023-12-24 ENCOUNTER — Emergency Department
Admission: EM | Admit: 2023-12-24 | Discharge: 2023-12-24 | Disposition: A | Discharge status: Home / Self Care | Attending: Emergency Medicine | Admitting: Emergency Medicine

## 2023-12-24 DIAGNOSIS — R109 Unspecified abdominal pain: Secondary | ICD-10-CM | POA: Diagnosis not present

## 2023-12-24 DIAGNOSIS — J449 Chronic obstructive pulmonary disease, unspecified: Secondary | ICD-10-CM | POA: Diagnosis not present

## 2023-12-24 LAB — COMPREHENSIVE METABOLIC PANEL WITH GFR
ALT: 7 U/L (ref 0–44)
AST: 12 U/L — ABNORMAL LOW (ref 15–41)
Albumin: 4.1 g/dL (ref 3.5–5.0)
Alkaline Phosphatase: 57 U/L (ref 38–126)
Anion gap: 9 (ref 5–15)
BUN: 9 mg/dL (ref 6–20)
CO2: 23 mmol/L (ref 22–32)
Calcium: 9.5 mg/dL (ref 8.9–10.3)
Chloride: 105 mmol/L (ref 98–111)
Creatinine, Ser: 0.78 mg/dL (ref 0.44–1.00)
GFR, Estimated: >60 mL/min (ref >60)
Glucose, Bld: 92 mg/dL (ref 70–99)
Potassium: 3.4 mmol/L — ABNORMAL LOW (ref 3.5–5.1)
Sodium: 137 mmol/L (ref 135–145)
Total Bilirubin: 0.7 mg/dL (ref 0.0–1.2)
Total Protein: 7 g/dL (ref 6.5–8.1)

## 2023-12-24 LAB — CBC
HCT: 40.8 % (ref 36.0–46.0)
Hemoglobin: 14.3 g/dL (ref 12.0–15.0)
MCH: 31.4 pg (ref 26.0–34.0)
MCHC: 35 g/dL (ref 30.0–36.0)
MCV: 89.5 fL (ref 80.0–100.0)
Platelets: 248 K/uL (ref 150–400)
RBC: 4.56 MIL/uL (ref 3.87–5.11)
RDW: 11.6 % (ref 11.5–15.5)
WBC: 7.1 K/uL (ref 4.0–10.5)
nRBC: 0 % (ref 0.0–0.2)

## 2023-12-24 LAB — PREGNANCY, URINE: Preg Test, Ur: NEGATIVE

## 2023-12-24 LAB — URINALYSIS, ROUTINE W REFLEX MICROSCOPIC
Bilirubin Urine: NEGATIVE
Glucose, UA: NEGATIVE mg/dL
Hgb urine dipstick: NEGATIVE
Ketones, ur: NEGATIVE mg/dL
Leukocytes,Ua: NEGATIVE
Nitrite: NEGATIVE
Protein, ur: NEGATIVE mg/dL
Specific Gravity, Urine: 1.016 (ref 1.005–1.030)
pH: 6 (ref 5.0–8.0)

## 2023-12-24 LAB — LIPASE, BLOOD: Lipase: 24 U/L (ref 11–51)

## 2023-12-24 MED ORDER — KETOROLAC TROMETHAMINE 30 MG/ML IJ SOLN
30.0000 mg | Freq: Once | INTRAMUSCULAR | Status: AC
  Administered 2023-12-24: 30 mg via INTRAMUSCULAR
  Filled 2023-12-24: qty 1

## 2023-12-24 MED ORDER — FENTANYL CITRATE PF 50 MCG/ML IJ SOSY
50.0000 ug | PREFILLED_SYRINGE | Freq: Once | INTRAMUSCULAR | Status: AC
  Administered 2023-12-24: 50 ug via INTRAVENOUS
  Filled 2023-12-24: qty 1

## 2023-12-24 MED ORDER — NAPROXEN 500 MG PO TABS: 500.0000 mg | ORAL_TABLET | Freq: Two times a day (BID) | ORAL | 2 refills | Status: AC

## 2023-12-24 NOTE — ED Triage Notes (Signed)
 Pt presents to the ED via POV from home for right sided abdominal pain that started yesterday. Denies nausea, vomiting, diarrhea, or difficulty with urination. Reports increased pain with palpation. Pt has had a c-section. No other abd surgeries.

## 2023-12-24 NOTE — ED Provider Notes (Signed)
 [facility] Provider Note    Event Date/Time   First MD Initiated Contact with Patient 12/24/23 1337     (approximate)   History   Abdominal Pain   HPI  Jacqueline Cooke is a 43 y.o. female with history of COPD, bipolar depression, history of substance abuse who presents with complaints of right flank pain.  She denies right upper quadrant pain, no epigastric pain, the pain is only in her right flank and has been present for couple of days now.  Denies a history of kidney stones, no dysuria.  No fevers reported.     Physical Exam   Triage Vital Signs: ED Triage Vitals [12/24/23 1223]  Encounter Vitals Group     BP (!) 113/97     Girls Systolic BP Percentile      Girls Diastolic BP Percentile      Boys Systolic BP Percentile      Boys Diastolic BP Percentile      Pulse Rate 97     Resp 18     Temp 99.2 F (37.3 C)     Temp src      SpO2 99 %     Weight 77.1 kg (170 lb)     Height 1.702 m (5' 7)     Head Circumference      Peak Flow      Pain Score 9     Pain Loc      Pain Education      Exclude from Growth Chart     Most recent vital signs: Vitals:   12/24/23 1730 12/24/23 1800  BP: 110/85 104/75  Pulse: 75 61  Resp:    Temp:    SpO2: 95% 98%     General: Awake, no distress.  CV:  Good peripheral perfusion.  Resp:  Normal effort.  Abd:  No distention.  No CVA tenderness, reassuring abdominal exam Other:     ED Results / Procedures / Treatments   Labs (all labs ordered are listed, but only abnormal results are displayed) Labs Reviewed  COMPREHENSIVE METABOLIC PANEL WITH GFR - Abnormal; Notable for the following components:      Result Value   Potassium 3.4 (*)    AST 12 (*)    All other components within normal limits  URINALYSIS, ROUTINE W REFLEX MICROSCOPIC - Abnormal; Notable for the following components:   Color, Urine YELLOW (*)    APPearance HAZY (*)    All other components within normal limits  LIPASE,  BLOOD  CBC  PREGNANCY, URINE     EKG     RADIOLOGY CT renal stone study pending    PROCEDURES:  Critical Care performed:   Procedures   MEDICATIONS ORDERED IN ED: Medications  ketorolac  (TORADOL ) 30 MG/ML injection 30 mg (30 mg Intramuscular Given 12/24/23 1412)  fentaNYL  (SUBLIMAZE ) injection 50 mcg (50 mcg Intravenous Given 12/24/23 1722)     IMPRESSION / MDM / ASSESSMENT AND PLAN / ED COURSE  I reviewed the triage vital signs and the nursing notes. Patient's presentation is most consistent with acute presentation with potential threat to life or bodily function.  Patient presents with right flank pain as detailed above, differential includes ureterolithiasis, less likely pyelonephritis as she is afebrile.  Lab work reviewed and is overall unremarkable, will send for CT renal stone study, will treat with IM, and reevaluate  Lab work reviewed and is reassuring, urinalysis normal, CT scan obtained which is unremarkable, patient feeling much  better after IM Toradol  and IV fentanyl  appropriate for discharge with close outpatient follow-up, return precautions discussed      FINAL CLINICAL IMPRESSION(S) / ED DIAGNOSES   Final diagnoses:  Flank pain     Rx / DC Orders   ED Discharge Orders          Ordered    naproxen  (NAPROSYN ) 500 MG tablet  2 times daily with meals        12/24/23 1800             Note:  This document was prepared using Dragon voice recognition software and may include unintentional dictation errors.   Arlander Charleston, MD 12/24/23 [phone]

## 2024-01-30 ENCOUNTER — Other Ambulatory Visit

## 2024-02-01 ENCOUNTER — Other Ambulatory Visit
